# Patient Record
Sex: Male | Born: 1965 | Race: White | Hispanic: No | Marital: Married | Smoking: Former smoker
Health system: Southern US, Community
[De-identification: ages and names within clinical notes are randomized; demographics above are authoritative.]

## PROBLEM LIST (undated history)

## (undated) DIAGNOSIS — F32A Depression, unspecified: Secondary | ICD-10-CM

## (undated) DIAGNOSIS — K649 Unspecified hemorrhoids: Secondary | ICD-10-CM

## (undated) DIAGNOSIS — N529 Male erectile dysfunction, unspecified: Secondary | ICD-10-CM

## (undated) DIAGNOSIS — R194 Change in bowel habit: Secondary | ICD-10-CM

## (undated) DIAGNOSIS — R590 Localized enlarged lymph nodes: Secondary | ICD-10-CM

## (undated) DIAGNOSIS — K760 Fatty (change of) liver, not elsewhere classified: Secondary | ICD-10-CM

## (undated) DIAGNOSIS — C19 Malignant neoplasm of rectosigmoid junction: Secondary | ICD-10-CM

## (undated) DIAGNOSIS — F329 Major depressive disorder, single episode, unspecified: Secondary | ICD-10-CM

## (undated) DIAGNOSIS — Z923 Personal history of irradiation: Secondary | ICD-10-CM

## (undated) DIAGNOSIS — N433 Hydrocele, unspecified: Secondary | ICD-10-CM

## (undated) DIAGNOSIS — Z9221 Personal history of antineoplastic chemotherapy: Secondary | ICD-10-CM

## (undated) DIAGNOSIS — N500 Atrophy of testis: Secondary | ICD-10-CM

## (undated) DIAGNOSIS — E785 Hyperlipidemia, unspecified: Secondary | ICD-10-CM

## (undated) DIAGNOSIS — R918 Other nonspecific abnormal finding of lung field: Secondary | ICD-10-CM

## (undated) HISTORY — DX: Hyperlipidemia, unspecified: E78.5

## (undated) HISTORY — DX: Change in bowel habit: R19.4

## (undated) HISTORY — PX: CHOLECYSTECTOMY: SHX55

## (undated) HISTORY — DX: Major depressive disorder, single episode, unspecified: F32.9

## (undated) HISTORY — DX: Atrophy of testis: N50.0

## (undated) HISTORY — DX: Localized enlarged lymph nodes: R59.0

## (undated) HISTORY — PX: ILEOSTOMY: SHX1783

## (undated) HISTORY — DX: Unspecified hemorrhoids: K64.9

## (undated) HISTORY — DX: Personal history of irradiation: Z92.3

## (undated) HISTORY — DX: Fatty (change of) liver, not elsewhere classified: K76.0

## (undated) HISTORY — DX: Male erectile dysfunction, unspecified: N52.9

## (undated) HISTORY — DX: Other nonspecific abnormal finding of lung field: R91.8

## (undated) HISTORY — DX: Depression, unspecified: F32.A

## (undated) HISTORY — PX: EYE SURGERY: SHX253

## (undated) HISTORY — PX: PORTACATH PLACEMENT: SHX2246

## (undated) HISTORY — DX: Personal history of antineoplastic chemotherapy: Z92.21

## (undated) HISTORY — PX: OTHER SURGICAL HISTORY: SHX169

## (undated) HISTORY — PX: INGUINAL HERNIA REPAIR: SUR1180

---

## 2002-12-10 ENCOUNTER — Encounter: Admission: RE | Admit: 2002-12-10 | Discharge: 2002-12-10 | Payer: Self-pay | Admitting: Occupational Medicine

## 2002-12-10 ENCOUNTER — Encounter: Payer: Self-pay | Admitting: Occupational Medicine

## 2007-01-23 ENCOUNTER — Inpatient Hospital Stay (HOSPITAL_COMMUNITY): Admission: EM | Admit: 2007-01-23 | Discharge: 2007-01-27 | Payer: Self-pay | Admitting: Emergency Medicine

## 2007-01-26 ENCOUNTER — Encounter: Payer: Self-pay | Admitting: Cardiology

## 2009-12-11 ENCOUNTER — Inpatient Hospital Stay (HOSPITAL_COMMUNITY): Admission: EM | Admit: 2009-12-11 | Discharge: 2009-12-14 | Payer: Self-pay | Admitting: Emergency Medicine

## 2009-12-11 ENCOUNTER — Emergency Department (HOSPITAL_COMMUNITY): Admission: EM | Admit: 2009-12-11 | Discharge: 2009-12-11 | Payer: Self-pay | Admitting: Emergency Medicine

## 2009-12-11 ENCOUNTER — Ambulatory Visit: Payer: Self-pay | Admitting: Orthopedic Surgery

## 2009-12-12 ENCOUNTER — Encounter: Payer: Self-pay | Admitting: Orthopedic Surgery

## 2009-12-13 ENCOUNTER — Ambulatory Visit: Payer: Self-pay | Admitting: Psychiatry

## 2009-12-14 ENCOUNTER — Inpatient Hospital Stay (HOSPITAL_COMMUNITY): Admission: AD | Admit: 2009-12-14 | Discharge: 2009-12-15 | Payer: Self-pay | Admitting: Psychiatry

## 2009-12-20 ENCOUNTER — Encounter (INDEPENDENT_AMBULATORY_CARE_PROVIDER_SITE_OTHER): Payer: Self-pay | Admitting: *Deleted

## 2009-12-20 ENCOUNTER — Ambulatory Visit: Payer: Self-pay | Admitting: Orthopedic Surgery

## 2009-12-20 DIAGNOSIS — S62339A Displaced fracture of neck of unspecified metacarpal bone, initial encounter for closed fracture: Secondary | ICD-10-CM | POA: Insufficient documentation

## 2010-01-02 ENCOUNTER — Ambulatory Visit (HOSPITAL_COMMUNITY): Payer: Self-pay | Admitting: Psychology

## 2010-01-10 ENCOUNTER — Ambulatory Visit: Payer: Self-pay | Admitting: Orthopedic Surgery

## 2010-01-10 ENCOUNTER — Telehealth: Payer: Self-pay | Admitting: Orthopedic Surgery

## 2010-01-11 ENCOUNTER — Telehealth: Payer: Self-pay | Admitting: Orthopedic Surgery

## 2010-01-11 ENCOUNTER — Encounter: Payer: Self-pay | Admitting: Orthopedic Surgery

## 2010-01-12 ENCOUNTER — Encounter: Payer: Self-pay | Admitting: Orthopedic Surgery

## 2010-01-17 ENCOUNTER — Ambulatory Visit (HOSPITAL_COMMUNITY): Payer: Self-pay | Admitting: Psychology

## 2010-02-06 ENCOUNTER — Ambulatory Visit (HOSPITAL_COMMUNITY): Payer: Self-pay | Admitting: Psychology

## 2010-08-03 ENCOUNTER — Ambulatory Visit: Payer: Self-pay | Admitting: Hematology and Oncology

## 2010-08-03 ENCOUNTER — Encounter
Admission: RE | Admit: 2010-08-03 | Discharge: 2010-08-03 | Payer: Self-pay | Source: Home / Self Care | Attending: Gastroenterology | Admitting: Gastroenterology

## 2010-08-09 NOTE — Progress Notes (Signed)
Summary: Notes faxed to [facility]. Occ Med for work physical  Phone Note From Other Clinic   Summary of Call: Judeth Cornfield from [facility]  (Ph [phone]) called back re: Work release form and work note. Requested office notes.  We have signed auth on file.  Faxed as requested to # [phone]. Initial call taken by: Cammie Sickle,  January 11, 2010 4:24 PM

## 2010-08-09 NOTE — Letter (Signed)
Summary: Physician work release form [employer]  Physician work release form [employer]   Imported By: Cammie Sickle 01/10/2010 14:54:39  _____________________________________________________________________  External Attachment:    Type:   Image     Comment:   External Document

## 2010-08-09 NOTE — Assessment & Plan Note (Signed)
Summary: 3 WK RE-CK/?NEW XRAY/RT HAND FX CARE/BCBS/CAF   Visit Type:  Follow-up Referring Provider:  ap er Primary Provider:  Dr. Rosezetta Schlatter  CC:  fx care rt hand.  History of Present Illness: I saw [name] in the office today for an initial visit.  He is a 46 years old man with the complaint of:  right hand 5th metacarpal fracture.  DOI 12/11/09 hit something.    Meds: Percocet 5 for pain.  hospital followup palm consultation fractured RIGHT 5th metacarpal  x-rays show that there is minimal fracture line noted, old fracture, angulation noted.  Recommend buddy taping for 3 weeks. Active range of motion followup for range of motion check.  Today is 3 week recheck on ROM after buddy tape.  Doing good, no pain med needed.  He has full range of motion his RIGHT hand no deformity and the rotational alignment of the fifth metacarpal he does have a extensor lag of his RIGHT ring finger at the DIP joint with some tenderness but he says it doesn't affect his hand motion  I offered him treatment for this which seems to be a mallet finger and he said that it doesn't bother him so he doesn't need anything done with it and it emphasized to him that we need to treat it now for going to do anything about it and he declined      Allergies: No Known Drug Allergies  Physical Exam  Additional Exam:  mention rrf ext tendon   Impression & Recommendations:  Problem # 1:  CLOSED FRACTURE OF NECK OF METACARPAL BONE (ICD-815.04) Assessment Improved  Orders: Post-Op Check (11914)  Patient Instructions: 1)  follow as needed

## 2010-08-09 NOTE — Letter (Signed)
Summary: Out of Work  [facility]  [address]  [place], [place] [address]   Phone: [phone]  Fax: [phone]    January 10, 2010   Employee:  [name]    To Whom It May Concern:  Patient had an appointment in our office today, 01/10/10.   For Medical reasons, please excuse/ the above named employee from work for the following dates:  Start:   12/11/2009  End/Return to regular work schedule, full duty:  01/15/10   If you need additional information, please feel free to contact our office.         Sincerely,    Terrance Mass, MD

## 2010-08-09 NOTE — Consult Note (Signed)
Summary: Hospital Consult FX 5th metacarpal  Hospital Consult FX 5th metacarpal   Imported By: Cammie Sickle 12/19/2009 20:05:57  _____________________________________________________________________  External Attachment:    Type:   Image     Comment:   External Document

## 2010-08-09 NOTE — Assessment & Plan Note (Signed)
Summary: HOSP FOL/UP/12/12/09/FX RT HAND/TREATED 12/12/09/XR THERE/BCBS/BSF   Vital Signs:  Patient profile:   45 year old male Height:      72 inches Weight:      200 pounds Pulse rate:   82 / minute Resp:     16 per minute  Vitals Entered By: Fuller Canada MD (December 20, 2009 2:44 PM)  Visit Type:  new patient Referring Provider:  ap er Primary Provider:  Dr. Rosezetta Schlatter  CC:  right hand fracture.  History of Present Illness: I saw [name] in the office today for an initial visit.  He is a 45 years old man with the complaint of:  right hand 5th metacarpal fracture.  DOI 12/11/09 hit something.  9 days post injury   Meds: Norco 5 for pain.  hospital followup palm consultation fractured RIGHT 5th metacarpal.  The patient was placed in a resting volar splint secondary to major soft tissue swelling. The splint seems to be cutting into his hand. He is here for followup and x-rays will be done today.   [facility]    x-rays show that there is minimal fracture line noted, old fracture, angulation noted.  Recommend buddy taping for 3 weeks. Active range of motion followup for range of motion check.  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 Cardiovascular:  Denies chest pain, palpitations, fainting, and murmurs. Respiratory:  Complains of snoring; denies short of breath, wheezing, couch, tightness, pain on inspiration, and snoring . Gastrointestinal:  Denies heartburn, nausea, vomiting, diarrhea, constipation, and blood in your  stools. Genitourinary:  Denies frequency, urgency, difficulty urinating, painful urination, flank pain, and bleeding in urine. Neurologic:  Denies numbness, tingling, unsteady gait, dizziness, tremors, and seizure. Musculoskeletal:  Complains of joint pain; denies swelling, instability, stiffness, redness, heat, and muscle pain. Endocrine:  Denies excessive thirst, exessive urination, and heat or cold intolerance. Psychiatric:  Complains of anxiety; denies nervousness, depression, and hallucinations. Skin:  Denies changes in the skin, poor healing, rash, itching, and redness. HEENT:  Denies blurred or double vision, eye pain, redness, and watering. Immunology:  Denies seasonal allergies, sinus problems, and allergic to bee stings. Hemoatologic:  Denies easy bleeding and brusing.  Physical Exam  Additional Exam:  he does have some soft tissue swelling still around the hand, but it is much less than before is tender over the 4th and 5th metacarpal heads.  I did not detect any rotatory deformity and passive flexion of 80 at the metacarpophalangeal joint.     Impression & Recommendations: 2 views of the RIGHT 5th metacarpal  Medications Added to Medication List This Visit: 1)  Percocet 5-325 Mg Tabs (Oxycodone-acetaminophen) .Marland Kitchen.. 1 by mouth q 4 as needed pain  Other Orders: Post-Op Check ([address]) Hand x-ray, minimum 3 views (73130)  Patient Instructions: 1)  buddy tape x 3 weeks  2)  norco for pain  3)  f/u in 3 weeks  Prescriptions: PERCOCET 5-325 MG TABS (OXYCODONE-ACETAMINOPHEN) 1 by mouth q 4 as needed pain  #60 x 0   Entered and Authorized by:   Fuller Canada MD   Signed  by:   Fuller Canada MD on 12/20/2009   Method used:   Handwritten   RxID:   [id number]

## 2010-08-09 NOTE — Medication Information (Signed)
Summary: Tax adviser   Imported By: Cammie Sickle 12/25/2009 18:14:32  _____________________________________________________________________  External Attachment:    Type:   Image     Comment:   External Document

## 2010-08-09 NOTE — Letter (Signed)
Summary: External Other  External Other   Imported By: Cammie Sickle 01/23/2010 13:42:48  _____________________________________________________________________  External Attachment:    Type:   Image     Comment:   External Document

## 2010-08-09 NOTE — Letter (Signed)
Summary: Out of Work  [facility]  [address]  [place], [place] [address]   Phone: [phone]  Fax: [phone]    December 20, 2009   Employee:  [name]    To Whom It May Concern:   For Medical reasons, please excuse the above named employee from work for the following dates:  Start:   12/11/2009  End:   01/10/10  Next scheduled appointment date:  01/10/10   If you need additional information, please feel free to contact our office.         Sincerely,    Terrance Mass, MD

## 2010-08-09 NOTE — Letter (Signed)
Summary: [employer] disab form  [employer] disab form   Imported By: Cammie Sickle 01/23/2010 11:37:24  _____________________________________________________________________  External Attachment:    Type:   Image     Comment:   External Document

## 2010-08-09 NOTE — Progress Notes (Signed)
Summary: Physician release form faxed   Phone Note Outgoing Call   Call placed to: Specialist Summary of Call: Physician release form faxed as per patient and employer request to:  [employer] [employer] Fax # [phone] with work note.  Patient has appt for physical w/company physician 01/11/10. Initial call taken by: Cammie Sickle,  January 10, 2010 2:53 PM

## 2010-08-09 NOTE — Letter (Signed)
Summary: History form  History form   Imported By: Jacklynn Ganong 12/25/2009 13:12:50  _____________________________________________________________________  External Attachment:    Type:   Image     Comment:   External Document

## 2010-08-10 ENCOUNTER — Ambulatory Visit (HOSPITAL_COMMUNITY)
Admission: RE | Admit: 2010-08-10 | Discharge: 2010-08-10 | Disposition: A | Discharge status: Home / Self Care | Payer: BC Managed Care – PPO | Source: Ambulatory Visit | Attending: Gastroenterology | Admitting: Gastroenterology

## 2010-08-10 DIAGNOSIS — C20 Malignant neoplasm of rectum: Secondary | ICD-10-CM | POA: Insufficient documentation

## 2010-08-14 ENCOUNTER — Other Ambulatory Visit: Payer: Self-pay | Admitting: Hematology and Oncology

## 2010-08-14 ENCOUNTER — Encounter (HOSPITAL_BASED_OUTPATIENT_CLINIC_OR_DEPARTMENT_OTHER): Payer: BC Managed Care – PPO | Admitting: Hematology and Oncology

## 2010-08-14 DIAGNOSIS — C20 Malignant neoplasm of rectum: Secondary | ICD-10-CM

## 2010-08-14 DIAGNOSIS — Z85048 Personal history of other malignant neoplasm of rectum, rectosigmoid junction, and anus: Secondary | ICD-10-CM

## 2010-08-14 LAB — COMPREHENSIVE METABOLIC PANEL
ALT: 25 U/L (ref 0–53)
Alkaline Phosphatase: 52 U/L (ref 39–117)
Creatinine, Ser: 0.95 mg/dL (ref 0.40–1.50)
Sodium: 140 mEq/L (ref 135–145)
Total Bilirubin: 0.4 mg/dL (ref 0.3–1.2)
Total Protein: 6.6 g/dL (ref 6.0–8.3)

## 2010-08-14 LAB — CBC WITH DIFFERENTIAL/PLATELET
BASO%: 0.6 % (ref 0.0–2.0)
LYMPH%: 23.2 % (ref 14.0–49.0)
MCH: 28.2 pg (ref 27.2–33.4)
MCHC: 34.2 g/dL (ref 32.0–36.0)
MCV: 82.4 fL (ref 79.3–98.0)
MONO%: 9.4 % (ref 0.0–14.0)
Platelets: 212 10*3/uL (ref 140–400)
RBC: 5.42 10*6/uL (ref 4.20–5.82)
WBC: 6 10*3/uL (ref 4.0–10.3)

## 2010-08-16 HISTORY — PX: PROCTECTOMY: SHX315

## 2010-08-20 ENCOUNTER — Ambulatory Visit: Payer: BC Managed Care – PPO | Attending: Radiation Oncology | Admitting: Radiation Oncology

## 2010-08-20 DIAGNOSIS — Z79899 Other long term (current) drug therapy: Secondary | ICD-10-CM | POA: Insufficient documentation

## 2010-08-20 DIAGNOSIS — Z87891 Personal history of nicotine dependence: Secondary | ICD-10-CM | POA: Insufficient documentation

## 2010-08-20 DIAGNOSIS — Z809 Family history of malignant neoplasm, unspecified: Secondary | ICD-10-CM | POA: Insufficient documentation

## 2010-08-20 DIAGNOSIS — Z803 Family history of malignant neoplasm of breast: Secondary | ICD-10-CM | POA: Insufficient documentation

## 2010-08-20 DIAGNOSIS — C20 Malignant neoplasm of rectum: Secondary | ICD-10-CM | POA: Insufficient documentation

## 2010-08-20 DIAGNOSIS — Z808 Family history of malignant neoplasm of other organs or systems: Secondary | ICD-10-CM | POA: Insufficient documentation

## 2010-08-20 DIAGNOSIS — E785 Hyperlipidemia, unspecified: Secondary | ICD-10-CM | POA: Insufficient documentation

## 2010-08-20 DIAGNOSIS — M109 Gout, unspecified: Secondary | ICD-10-CM | POA: Insufficient documentation

## 2010-08-21 ENCOUNTER — Ambulatory Visit (HOSPITAL_COMMUNITY)
Admission: RE | Admit: 2010-08-21 | Discharge: 2010-08-21 | Disposition: A | Discharge status: Home / Self Care | Payer: BC Managed Care – PPO | Source: Ambulatory Visit | Attending: Hematology and Oncology | Admitting: Hematology and Oncology

## 2010-08-21 ENCOUNTER — Encounter (HOSPITAL_COMMUNITY): Payer: Self-pay

## 2010-08-21 DIAGNOSIS — C20 Malignant neoplasm of rectum: Secondary | ICD-10-CM | POA: Insufficient documentation

## 2010-08-23 ENCOUNTER — Encounter (HOSPITAL_COMMUNITY): Payer: Self-pay

## 2010-08-23 ENCOUNTER — Encounter (HOSPITAL_COMMUNITY)
Admission: RE | Admit: 2010-08-23 | Discharge: 2010-08-23 | Disposition: A | Discharge status: Home / Self Care | Payer: BC Managed Care – PPO | Source: Ambulatory Visit | Attending: Hematology and Oncology | Admitting: Hematology and Oncology

## 2010-08-23 DIAGNOSIS — C20 Malignant neoplasm of rectum: Secondary | ICD-10-CM | POA: Insufficient documentation

## 2010-08-23 DIAGNOSIS — K7689 Other specified diseases of liver: Secondary | ICD-10-CM | POA: Insufficient documentation

## 2010-08-23 DIAGNOSIS — Z85048 Personal history of other malignant neoplasm of rectum, rectosigmoid junction, and anus: Secondary | ICD-10-CM

## 2010-08-23 LAB — GLUCOSE, CAPILLARY: Glucose-Capillary: 110 mg/dL — ABNORMAL HIGH (ref 70–99)

## 2010-08-23 MED ORDER — FLUDEOXYGLUCOSE F - 18 (FDG) INJECTION
16.4000 | Freq: Once | INTRAVENOUS | Status: AC | PRN
  Administered 2010-08-23: 16.4 via INTRAVENOUS

## 2010-09-24 LAB — CBC
HCT: 43.7 % (ref 39.0–52.0)
Hemoglobin: 14.8 g/dL (ref 13.0–17.0)
Hemoglobin: 14.9 g/dL (ref 13.0–17.0)
MCHC: 34.2 g/dL (ref 30.0–36.0)
MCHC: 34.7 g/dL (ref 30.0–36.0)
MCV: 84.2 fL (ref 78.0–100.0)
Platelets: 201 10*3/uL (ref 150–400)
RDW: 12.8 % (ref 11.5–15.5)
RDW: 13.2 % (ref 11.5–15.5)

## 2010-09-24 LAB — RAPID URINE DRUG SCREEN, HOSP PERFORMED
Benzodiazepines: POSITIVE — AB
Cocaine: NOT DETECTED
Opiates: NOT DETECTED
Tetrahydrocannabinol: NOT DETECTED

## 2010-09-24 LAB — DIFFERENTIAL
Basophils Absolute: 0 10*3/uL (ref 0.0–0.1)
Basophils Relative: 0 % (ref 0–1)
Basophils Relative: 0 % (ref 0–1)
Eosinophils Absolute: 0 10*3/uL (ref 0.0–0.7)
Eosinophils Relative: 0 % (ref 0–5)
Eosinophils Relative: 2 % (ref 0–5)
Lymphocytes Relative: 25 % (ref 12–46)
Lymphs Abs: 1.2 10*3/uL (ref 0.7–4.0)
Monocytes Absolute: 0.4 10*3/uL (ref 0.1–1.0)
Monocytes Absolute: 0.6 10*3/uL (ref 0.1–1.0)
Monocytes Relative: 7 % (ref 3–12)
Neutrophils Relative %: 73 % (ref 43–77)

## 2010-09-24 LAB — URINALYSIS, ROUTINE W REFLEX MICROSCOPIC
Bilirubin Urine: NEGATIVE
Hgb urine dipstick: NEGATIVE
Ketones, ur: NEGATIVE mg/dL
Nitrite: NEGATIVE
pH: 5.5 (ref 5.0–8.0)

## 2010-09-24 LAB — URINE CULTURE: Colony Count: NO GROWTH

## 2010-09-24 LAB — BLOOD GAS, ARTERIAL
Bicarbonate: 23.8 mEq/L (ref 20.0–24.0)
FIO2: 1 %
O2 Saturation: 99.6 %
Patient temperature: 37
TCO2: 20.9 mmol/L (ref 0–100)
pO2, Arterial: 360 mmHg — ABNORMAL HIGH (ref 80.0–100.0)

## 2010-09-24 LAB — COMPREHENSIVE METABOLIC PANEL
ALT: 28 U/L (ref 0–53)
AST: 21 U/L (ref 0–37)
Albumin: 3.8 g/dL (ref 3.5–5.2)
Albumin: 4.1 g/dL (ref 3.5–5.2)
Alkaline Phosphatase: 51 U/L (ref 39–117)
BUN: 8 mg/dL (ref 6–23)
Calcium: 8.6 mg/dL (ref 8.4–10.5)
Chloride: 113 mEq/L — ABNORMAL HIGH (ref 96–112)
Creatinine, Ser: 1 mg/dL (ref 0.4–1.5)
GFR calc Af Amer: >60 mL/min (ref >60)
Glucose, Bld: 103 mg/dL — ABNORMAL HIGH (ref 70–99)
Potassium: 3.7 mEq/L (ref 3.5–5.1)
Sodium: 143 mEq/L (ref 135–145)
Total Bilirubin: 0.8 mg/dL (ref 0.3–1.2)
Total Protein: 6.6 g/dL (ref 6.0–8.3)

## 2010-09-24 LAB — ACETAMINOPHEN LEVEL: Acetaminophen (Tylenol), Serum: <10 ug/mL — ABNORMAL LOW (ref 10–30)

## 2010-09-24 LAB — ETHANOL: Alcohol, Ethyl (B): 128 mg/dL — ABNORMAL HIGH (ref 0–10)

## 2010-09-24 LAB — TRICYCLICS SCREEN, URINE: TCA Scrn: NOT DETECTED

## 2010-10-17 ENCOUNTER — Ambulatory Visit: Payer: BC Managed Care – PPO | Attending: Radiation Oncology | Admitting: Radiation Oncology

## 2010-10-17 DIAGNOSIS — Z79899 Other long term (current) drug therapy: Secondary | ICD-10-CM | POA: Insufficient documentation

## 2010-10-17 DIAGNOSIS — C20 Malignant neoplasm of rectum: Secondary | ICD-10-CM | POA: Insufficient documentation

## 2010-10-17 DIAGNOSIS — C779 Secondary and unspecified malignant neoplasm of lymph node, unspecified: Secondary | ICD-10-CM | POA: Insufficient documentation

## 2010-10-17 DIAGNOSIS — Z980 Intestinal bypass and anastomosis status: Secondary | ICD-10-CM | POA: Insufficient documentation

## 2010-10-17 DIAGNOSIS — Z87891 Personal history of nicotine dependence: Secondary | ICD-10-CM | POA: Insufficient documentation

## 2010-10-17 DIAGNOSIS — Z932 Ileostomy status: Secondary | ICD-10-CM | POA: Insufficient documentation

## 2010-10-18 ENCOUNTER — Encounter (HOSPITAL_BASED_OUTPATIENT_CLINIC_OR_DEPARTMENT_OTHER): Payer: BC Managed Care – PPO | Admitting: Hematology and Oncology

## 2010-10-18 ENCOUNTER — Other Ambulatory Visit: Payer: Self-pay | Admitting: Hematology and Oncology

## 2010-10-18 DIAGNOSIS — C20 Malignant neoplasm of rectum: Secondary | ICD-10-CM

## 2010-10-18 DIAGNOSIS — Z5111 Encounter for antineoplastic chemotherapy: Secondary | ICD-10-CM

## 2010-10-18 LAB — CBC WITH DIFFERENTIAL/PLATELET
Basophils Absolute: 0 10*3/uL (ref 0.0–0.1)
Eosinophils Absolute: 0.1 10*3/uL (ref 0.0–0.5)
HGB: 14.1 g/dL (ref 13.0–17.1)
MCV: 81.7 fL (ref 79.3–98.0)
MONO#: 0.6 10*3/uL (ref 0.1–0.9)
MONO%: 12.3 % (ref 0.0–14.0)
NEUT#: 2.6 10*3/uL (ref 1.5–6.5)
RDW: 13.3 % (ref 11.0–14.6)
lymph#: 1.3 10*3/uL (ref 0.9–3.3)

## 2010-10-18 LAB — COMPREHENSIVE METABOLIC PANEL
Albumin: 4.3 g/dL (ref 3.5–5.2)
BUN: 10 mg/dL (ref 6–23)
CO2: 24 mEq/L (ref 19–32)
Calcium: 9.6 mg/dL (ref 8.4–10.5)
Chloride: 107 mEq/L (ref 96–112)
Glucose, Bld: 91 mg/dL (ref 70–99)
Potassium: 4.2 mEq/L (ref 3.5–5.3)

## 2010-10-31 ENCOUNTER — Other Ambulatory Visit: Payer: Self-pay | Admitting: Hematology and Oncology

## 2010-10-31 ENCOUNTER — Encounter (HOSPITAL_BASED_OUTPATIENT_CLINIC_OR_DEPARTMENT_OTHER): Payer: BC Managed Care – PPO | Admitting: Hematology and Oncology

## 2010-10-31 DIAGNOSIS — C20 Malignant neoplasm of rectum: Secondary | ICD-10-CM

## 2010-10-31 DIAGNOSIS — Z5111 Encounter for antineoplastic chemotherapy: Secondary | ICD-10-CM

## 2010-10-31 LAB — CBC WITH DIFFERENTIAL/PLATELET
Basophils Absolute: 0 10*3/uL (ref 0.0–0.1)
EOS%: 3.4 % (ref 0.0–7.0)
Eosinophils Absolute: 0.2 10*3/uL (ref 0.0–0.5)
HGB: 15.2 g/dL (ref 13.0–17.1)
NEUT#: 2.8 10*3/uL (ref 1.5–6.5)
RDW: 13.5 % (ref 11.0–14.6)
WBC: 4.8 10*3/uL (ref 4.0–10.3)
lymph#: 1.4 10*3/uL (ref 0.9–3.3)

## 2010-11-02 ENCOUNTER — Encounter (HOSPITAL_BASED_OUTPATIENT_CLINIC_OR_DEPARTMENT_OTHER): Payer: BC Managed Care – PPO | Admitting: Hematology and Oncology

## 2010-11-02 DIAGNOSIS — C20 Malignant neoplasm of rectum: Secondary | ICD-10-CM

## 2010-11-02 DIAGNOSIS — Z5189 Encounter for other specified aftercare: Secondary | ICD-10-CM

## 2010-11-10 ENCOUNTER — Emergency Department (HOSPITAL_COMMUNITY): Payer: BC Managed Care – PPO

## 2010-11-10 ENCOUNTER — Ambulatory Visit (HOSPITAL_COMMUNITY)
Admission: EM | Admit: 2010-11-10 | Discharge: 2010-11-12 | Disposition: A | Discharge status: Home / Self Care | Payer: BC Managed Care – PPO | Attending: Surgery | Admitting: Surgery

## 2010-11-10 DIAGNOSIS — Z01812 Encounter for preprocedural laboratory examination: Secondary | ICD-10-CM | POA: Insufficient documentation

## 2010-11-10 DIAGNOSIS — Z01810 Encounter for preprocedural cardiovascular examination: Secondary | ICD-10-CM | POA: Insufficient documentation

## 2010-11-10 DIAGNOSIS — R1011 Right upper quadrant pain: Secondary | ICD-10-CM | POA: Insufficient documentation

## 2010-11-10 DIAGNOSIS — C20 Malignant neoplasm of rectum: Secondary | ICD-10-CM | POA: Insufficient documentation

## 2010-11-10 DIAGNOSIS — C189 Malignant neoplasm of colon, unspecified: Secondary | ICD-10-CM

## 2010-11-10 DIAGNOSIS — Z932 Ileostomy status: Secondary | ICD-10-CM | POA: Insufficient documentation

## 2010-11-10 DIAGNOSIS — Z79899 Other long term (current) drug therapy: Secondary | ICD-10-CM | POA: Insufficient documentation

## 2010-11-10 DIAGNOSIS — K8010 Calculus of gallbladder with chronic cholecystitis without obstruction: Secondary | ICD-10-CM | POA: Insufficient documentation

## 2010-11-10 DIAGNOSIS — K8000 Calculus of gallbladder with acute cholecystitis without obstruction: Secondary | ICD-10-CM | POA: Insufficient documentation

## 2010-11-10 DIAGNOSIS — Z980 Intestinal bypass and anastomosis status: Secondary | ICD-10-CM | POA: Insufficient documentation

## 2010-11-10 LAB — DIFFERENTIAL
Basophils Absolute: 0 10*3/uL (ref 0.0–0.1)
Eosinophils Absolute: 0.1 10*3/uL (ref 0.0–0.7)
Lymphocytes Relative: 10 % — ABNORMAL LOW (ref 12–46)
Monocytes Absolute: 1.1 10*3/uL — ABNORMAL HIGH (ref 0.1–1.0)
Neutro Abs: 11.4 10*3/uL — ABNORMAL HIGH (ref 1.7–7.7)

## 2010-11-10 LAB — URINALYSIS, ROUTINE W REFLEX MICROSCOPIC
Glucose, UA: NEGATIVE mg/dL
Hgb urine dipstick: NEGATIVE
Leukocytes, UA: NEGATIVE
Specific Gravity, Urine: 1.028 (ref 1.005–1.030)
Urobilinogen, UA: 0.2 mg/dL (ref 0.0–1.0)

## 2010-11-10 LAB — COMPREHENSIVE METABOLIC PANEL
ALT: 25 U/L (ref 0–53)
AST: 17 U/L (ref 0–37)
Albumin: 3.9 g/dL (ref 3.5–5.2)
CO2: 24 mEq/L (ref 19–32)
Calcium: 9.5 mg/dL (ref 8.4–10.5)
Creatinine, Ser: 0.84 mg/dL (ref 0.4–1.5)
GFR calc Af Amer: >60 mL/min (ref >60)
Sodium: 138 mEq/L (ref 135–145)
Total Protein: 6.8 g/dL (ref 6.0–8.3)

## 2010-11-10 LAB — CBC
Hemoglobin: 13.8 g/dL (ref 13.0–17.0)
MCH: 27.6 pg (ref 26.0–34.0)
MCHC: 34.1 g/dL (ref 30.0–36.0)
Platelets: 173 10*3/uL (ref 150–400)
RDW: 13.6 % (ref 11.5–15.5)

## 2010-11-10 LAB — URINE MICROSCOPIC-ADD ON

## 2010-11-10 LAB — APTT: aPTT: 30 seconds (ref 24–37)

## 2010-11-10 LAB — PROTIME-INR: INR: 1.11 (ref 0.00–1.49)

## 2010-11-11 ENCOUNTER — Inpatient Hospital Stay (HOSPITAL_COMMUNITY): Payer: BC Managed Care – PPO

## 2010-11-11 ENCOUNTER — Other Ambulatory Visit: Payer: Self-pay | Admitting: Surgery

## 2010-11-11 LAB — MRSA PCR SCREENING: MRSA by PCR: NEGATIVE

## 2010-11-20 NOTE — H&P (Signed)
NAMEDAMEAN, POFFENBERGER NO.:  [id number]  MEDICAL RECORD NO.:  [id number]           PATIENT TYPE:  E  LOCATION:  WLED                         FACILITY:  [facility]  PHYSICIAN:  Velora Heckler, MD      DATE OF BIRTH:  April 19, 1966  DATE OF ADMISSION:  11/10/2010                              HISTORY & PHYSICAL   REFERRING PHYSICIAN:  Emeline General, M.D., [facility].  REASON FOR ADMISSION:  Abdominal pain, acute cholecystitis, cholelithiasis.  BRIEF HISTORY:  The patient is a 45 year old white male who presents to the emergency department with 3-day history of intermittent right upper quadrant abdominal pain.  Severity has increased.  He has developed nausea and vomiting.  Pain radiates to the back.  The patient was seen in the emergency department.  His wife is a Engineer, civil (consulting) at [employer].  Laboratory studies showed elevated white count of 14,000, although the patient has been receiving Neupogen as part of his adjuvant treatment for rectal carcinoma.  The patient underwent abdominal ultrasound which showed a thickened irregular gallbladder wall.  There were gallstones and sludge present.  There was tenderness in the right upper quadrant.  There was no biliary dilatation.  General Surgery was asked to evaluate for acute cholecystitis and its management.  PAST MEDICAL HISTORY:  The patient diagnosed with rectal carcinoma. Initially evaluated by Dr. Avel Peace.  He was referred to [facility] and underwent low anterior resection with ileostomy on September 05, 2010.  The patient is receiving adjuvant chemotherapy under the direction of Dr. Dalene Carrow.  Adjuvant radiation therapy is planned. The patient is status post Port-A-Cath placement, right upper chest wall.  He had a right inguinal hernia repaired in 2006 at [facility].  MEDICATIONS:  Zofran, oxycodone, Compazine, Flagyl, chemotherapy of unknown type.  ALLERGIES:  PERCOCET causing  itching.  REVIEW OF SYSTEMS:  15-system review without significant other findings except as noted above.  FAMILY HISTORY:  Noncontributory.  PHYSICAL EXAMINATION:  GENERAL:  A 45 year old well-developed, well- nourished white male ambulating in the emergency department in mild discomfort. VITAL SIGNS:  Temperature 98.8, heart rate 71, blood pressure 129/77, respiratory rate 19, O2 saturation 98% on room air. HEENT:  Shows him to be normocephalic, atraumatic.  Sclerae clear. Conjunctiva clear.  Pupils equal and reactive.  Dentition fair.  Mucous membranes moist.  Voice normal. NECK:  Palpation of the neck shows no lymphadenopathy.  No thyroid nodularity.  No tenderness. LUNGS:  Clear to auscultation bilaterally. CARDIAC:  Shows regular rate and rhythm without significant murmur. Chest wall shows a Port-A-Cath in the right upper chest wall which has been accessed. ABDOMEN:  Soft without distention.  Well-healed Pfannenstiel-type incision.  Ileostomy in the right mid abdomen.  No sign of hernia.  No sign of wound infections.  There are bowel sounds on auscultation. There is mild to moderate tenderness in the right upper quadrant with some voluntary guarding.  There is no palpable mass.  There is no hepatosplenomegaly. EXTREMITIES:  Nontender without edema. NEUROLOGICAL:  The patient is alert and oriented without focal deficit. There is no sign  of tremor.  LABORATORY STUDIES:  White count 14.0, hemoglobin 13.8, platelet count 173,000.  Electrolytes are normal.  Liver function tests are normal. Lipase normal at 46.  RADIOGRAPHIC STUDIES:  Ultrasound demonstrating a thickened gallbladder wall containing stones and sludge with tenderness.  IMPRESSION: 1. Acute cholecystitis, cholelithiasis. 2. History of rectal carcinoma with ongoing adjuvant chemotherapy.  PLAN: 1. The patient will be admitted to the General Surgery service at     [facility]. 2. Intravenous  antibiotics will be initiated.  The patient will be     allowed a clear liquid diet today. 3. Medical Oncology consultation will be requested. 4. Tentatively planned for surgery on the morning of Nov 11, 2010 at     [facility] for laparoscopic cholecystectomy     with intraoperative cholangiography.   Velora Heckler, MD, FACS     TMG/MEDQ  D:  11/10/2010  T:  11/10/2010  Job:  [id number]  cc:   Laurice Record, M.D. Fax: [phone]  Electronically Signed by Darnell Level MD on 11/20/2010 12:04:45 PM

## 2010-11-20 NOTE — Discharge Summary (Signed)
NAMEJARON, [name]                ACCOUNT NO.:  [id number]   MEDICAL RECORD NO.:  [id number]          PATIENT TYPE:  INP   LOCATION:  1417                         FACILITY:  [facility]   PHYSICIAN:  Mohan N. Sharyn Lull, M.D. DATE OF BIRTH:  10/30/1965   DATE OF ADMISSION:  01/23/2007  DATE OF DISCHARGE:  01/27/2007                               DISCHARGE SUMMARY   ADMITTING DIAGNOSES:  1. Chest pain, rule out myocardial infarction.  2. Elevated blood pressure, rule out hypertension.  3. Hypercholesteremia.  4. History of tobacco abuse.  5. History of cocaine abuse.   DISCHARGE DIAGNOSES:  1. Status post chest pain, MI ruled out, negative Persantine Myoview,      rule out GERD, rule out peptic ulcer disease, rule out gastritis,      resolving.  2. Resolving an acute gouty arthritis of right elbow/cellulitis of the      right elbow, arm, and forearm.  3. Hypercholesteremia.  4. Remote history of tobacco and cocaine abuse.   DISCHARGE HOME MEDICATIONS:  1. Enteric-coated aspirin 81 mg 1 tablet daily.  2. Indocin 50 mg 1 tablet three times daily with food.  3. Colchicine 0.6 mg 1 tablet daily.  4. Allopurinol 300 mg tablet daily.  5. Protonix 40 mg 1 tablet daily.  6. Keflex 500 mg 1 tablet every 8 hours for 7 days.  7. Simcor 500/20 1 tablet daily.  8. Nitrostat 0.4 mg sublingual, use as directed.   DIET:  Low salt, low cholesterol.   ACTIVITY:  As tolerated.  Follow-up with me in 1 week.   CONDITION AT DISCHARGE:  Is stable.  The patient has been advised to go  to the ER, if his elbow swelling, redness and pain gets worse.   BRIEF HISTORY AND HOSPITAL COURSE:  [name] is a 45 year old white  male with no significant past medical history, except for  hypercholesteremia and positive family history of coronary artery  disease, history of remote tobacco abuse and cocaine abuse.  He came to  the ER complaining of retrosternal chest pressure, tightness, as if  someone is sitting  on the chest, associated with tingling in the neck,  diaphoresis.  Chest pain was rated 6/10 off and on, also associated with  shortness of breath.  The patient gives history of exertional chest pain  and exertional dyspnea, relieved with rest.  Denies any palpitation,  lightheadedness or syncope, but states feels dizzy and felt dizzy and  nearly passed out today.   PAST MEDICAL HISTORY:  As above.   PAST SURGICAL HISTORY:  Had right inguinal hernia repairs in the past.   ALLERGIES:  NO KNOWN DRUG ALLERGIES.   MEDICATION:  None.   SOCIAL HISTORY:  He is married and has two children.  Smoked one pack  per day for 20 years, quit 9 years ago.  Drinks beer and whiskey  occasionally, socially.  He did cocaine approximately 17 years ago.  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   FAMILY HISTORY:  Father is alive.  He is 64.  He had hypercholesteremia.  Mother is  alive.  She is hypertensive.  She had two strokes.  One  brother and one sister in good health.  His grandparents had MI at young  age.   PHYSICAL EXAMINATION:  GENERAL:  On examination, he is alert, awake,  oriented x3, in no acute distress.  VITAL SIGNS:  Blood pressure was 127/88, pulse was 68, regular.  HEENT:  Conjunctivae was pink.  NECK:  Supple, no JVD, no bruit.  LUNGS:  Clear to auscultation without rhonchi or rales.  CARDIOVASCULAR:  S1, S2 was normal.  There was soft S4 gallop.  There  was no S3 gallop or murmur.  ABDOMEN:  Soft.  Bowel sounds were present, nontender.  EXTREMITIES:  There is no clubbing, cyanosis or edema.   LABS:  EKG showed normal sinus rhythm with no acute ischemic changes.  His other labs:  Cholesterol was 209, triglyceride 271, HDL was low at  29, LDL was 126.  There is three sets of cardiac enzymes:  CK 130, MB  1.1, second set CK 98, MB 1.0; third set CK 87, MB 1.0.  Troponin I:  Three sets were 0.01, 0.03 and 0.02.  His sodium was 143, potassium 4.1,  chloride 111, bicarb 27, glucose  91, BUN 12, creatinine 1.03.  Hemoglobin was 13.7, hematocrit 39.8, white count of 5.9.   BRIEF HOSPITAL COURSE:  The patient was admitted to telemetry unit at  [facility] and was started on IV heparin, nitrates.  MI was  ruled out by serial enzymes, and EKG stress test could not be done  during the weekend, due to busy schedule and subsequently underwent  Persantine Myoview on July 21, which showed no evidence of reversible  ischemia, with EF of 70%.  The patient also complained of severe right  elbow pain and swelling and redness yesterday.  The patient was started  on NSAIDS and also started on IV antibiotics, with improvement in his  elbow swelling and pain.  His uric acid level is mildly elevated.  The  patient was eager to go home.  The patient remained afebrile during the  hospital stay.  The patient will be discharged home on above medications  and will be followed up in my office in 1 week.  The patient has been  advised to report to the ER,  if his right elbow pain, swelling, redness  gets worse, or if he develops any fever or chills.           ______________________________  Eduardo Osier Sharyn Lull, M.D.     MNH/MEDQ  D:  01/27/2007  T:  01/27/2007  Job:  [id number]   cc:   Eduardo Osier. Sharyn Lull, M.D.

## 2010-11-20 NOTE — Discharge Summary (Signed)
[name], [name]                ACCOUNT NO.:  [id number]  MEDICAL RECORD NO.:  [id number]           PATIENT TYPE:  I  LOCATION:  1524                         FACILITY:  [facility]  PHYSICIAN:  Velora Heckler, MD      DATE OF BIRTH:  09/30/1965  DATE OF ADMISSION:  11/10/2010 DATE OF DISCHARGE:  11/12/2010                              DISCHARGE SUMMARY   ADMISSION DIAGNOSES: 1. Acute cholecystitis/cholelithiasis. 2. Rectal cancer with anterior resection ileostomy, September 05, 2010     at [facility]. 3. Ongoing chemotherapy.  DISCHARGE DIAGNOSES: 1. Acute cholecystitis/cholelithiasis. 2. Rectal cancer with anterior resection ileostomy, September 05, 2010     at [facility]. 3. Ongoing chemotherapy.  PROCEDURES:  Laparoscopic cholecystectomy and intraoperative cholangiogram.  BRIEF HISTORY:  The patient is a 45 year old white male who presented with 3-day history of intermittent right upper quadrant pain which became progressively worse.  He had nausea and vomiting, pain went to his back.  He was seen in the emergency room at [facility].  White count was elevated.  He has been receiving Neupogen as part of his adjuvant therapy for rectal cancer.  Ultrasound showed an irregular thickened gallbladder wall for gallstones and sludge present.  He was seen in consultation by Dr. Gerrit Friends and his impression, the patient had acute cholecystitis and he recommended laparoscopic cholecystectomy.  He also requested oncology consult.  For further history and physical, please see the dictated note.  HOSPITAL COURSE:  The patient was admitted and taken to the OR after oncology consult.  It was their opinion that he has stage III rectal cancer and anterior resection with coloanal anastomosis.  They recommend going forward with regular surgical treatment which was done on Nov 11, 2010 by Dr. Gerrit Friends.  The patient tolerated the procedure well and returned to the floor in satisfactory condition.  His diet was  advanced and he was taking a soft diet by evening, Nov 11, 2010.  This morning, he was able to eat regular diet.  He is passing gas.  He has stool in his ileostomy.   He has been seen by oncology and has an appointment tentatively scheduled with them for this week.  The patient was seen by Dr. Dalene Carrow, nurse practitioner Ms. Madilyn Fireman.  I have not heard back but the patient is anxious to go home, so I am going to tell the patient to keep his scheduled point with Dr. Dalene Carrow and then we will follow him up in 2 weeks in the office on Nov 29, 2010 at 3:30, Dr. Gerrit Friends.  DISCHARGE PLAN:  He will resume his preadmission meds which included Crestor 10 mg daily, glucosamine 2 tablets daily, Xanax 1 mg q.8h. p.r.n.  He will continue his Tylenol and hydrocodone/APAP q.4h. p.r.n. for pain as needed.  CONDITION ON DISCHARGE:  Improved.     Eber Hong, P.A.   ______________________________ Velora Heckler, MD    WDJ/MEDQ  D:  11/12/2010  T:  11/12/2010  Job:  [id number]  cc:   Laurice Record, M.D. Fax: [phone]  Electronically Signed by Sherrie George P.A. on 11/12/2010 04:35:09 PM Electronically  Signed by Darnell Level MD on 11/20/2010 12:04:27 PM

## 2010-11-20 NOTE — Op Note (Signed)
[name], [name]                ACCOUNT NO.:  [id number]  MEDICAL RECORD NO.:  [id number]           PATIENT TYPE:  I  LOCATION:  1524                         FACILITY:  [facility]  PHYSICIAN:  Velora Heckler, MD      DATE OF BIRTH:  09/15/1965  DATE OF PROCEDURE:  11/11/2010                               OPERATIVE REPORT   PREOPERATIVE DIAGNOSIS:  Acute cholecystitis, cholelithiasis  POSTOPERATIVE DIAGNOSIS:  Acute cholecystitis, cholelithiasis  PROCEDURE:  Laparoscopic cholecystectomy with intraoperative cholangiography.  SURGEON:  Velora Heckler, MD, FACS  ASSISTANT:  Karie Soda, MD, FACS  ANESTHESIA:  General per Dr. Ronelle Nigh  ESTIMATED BLOOD LOSS:  Minimal.  PREPARATION:  ChloraPrep.  COMPLICATIONS:  None.  INDICATIONS:  The patient is a 45 year old white male admitted from the emergency department with signs and symptoms of acute cholecystitis. The patient presented with a 3-day history of abdominal pain in the right upper quadrant radiating to the back.  Ultrasound showed a thick- walled gallbladder containing sludge and stones.  White blood cell count was elevated at 14000.  The patient was admitted to the surgical service and after consultation with medical oncology was prepared for the operating room.  BODY OF REPORT:  Procedure was done in OR #1 at [facility].  The patient was brought to the operating room, placed in supine position on the operating room table.  Following administration of general anesthesia, the patient was prepped and draped in usual strict aseptic fashion.  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  This was draped out of the field with a Steri-Drape. Incision was made just above the level of the umbilicus in the midline. Dissection was carried down to the fascia and the fascia was incised in the midline and the peritoneal cavity entered cautiously.  A 0 Vicryl pursestring suture was placed in  the fascia.  A Hassan cannula was introduced and the abdomen insufflated with carbon dioxide.  Laparoscope was introduced and the abdomen briefly explored.  There was clear visualization of the right upper quadrant.  Operative ports were placed along the right costal margin in the midline, midclavicular line, and anterior axillary line.  Fundus of the gallbladder was grasped and retracted cephalad.  Omental adhesions to the undersurface of the gallbladder were easily taken down with blunt dissection and hemostasis obtained with electrocautery.  Peritoneum at the neck of the gallbladder was incised.  Cystic duct was dissected out along its length and a clip was placed at the junction of the cystic duct and the gallbladder neck. Cystic artery was dissected out, doubly clipped, and divided.  Cystic duct was incised.  Clear yellow bile emanates from the cystic duct.  A Cook cholangiography catheter was introduced through a stab wound in the right upper quadrant and inserted into the cystic duct.  It was secured with the ligature clip.  Using C-arm fluoroscopy, real time cholangiography was performed.  There was rapid filling of a normal caliber biliary tree.  There was free flow distally into the duodenum without filling defect or obstruction.  There was reflux of contrast  into both the right and left hepatic ductal systems.  Clip was withdrawn and Cook catheter was removed from the peritoneal cavity.  Cystic duct was triply clipped and divided.  Posterior branch of the cystic artery was dissected out, doubly clipped, and divided. Gallbladder was then excised from the gallbladder bed using the hook electrocautery for hemostasis.  Gallbladder was completely resected and placed into an EndoCatch bag.  It was withdrawn through the umbilical port without difficulty.  It was submitted to pathology for review.  Right upper quadrant was irrigated with warm saline and good hemostasis was noted.   Fluid was evacuated.  Ports were removed under direct vision and pneumoperitoneum was released.  Good hemostasis was achieved at all port sites.  0 Vicryl pursestring suture tied securely at the umbilicus. All wounds were anesthetized with local anesthetic.  All wounds were closed with interrupted 4-0 Vicryl subcuticular sutures.  Wounds were washed and dried and Benzoin and Steri-Strips were applied.  Sterile dressings were applied.  The patient was awakened from anesthesia and brought to the recovery room.  The patient tolerated the procedure well.   Velora Heckler, MD, FACS     TMG/MEDQ  D:  11/11/2010  T:  11/11/2010  Job:  [id number]  cc:   Laurice Record, M.D. Fax: [phone]  [name], MD Fax: [phone]  Electronically Signed by Darnell Level MD on 11/20/2010 12:05:18 PM

## 2010-11-22 ENCOUNTER — Ambulatory Visit
Admission: RE | Admit: 2010-11-22 | Discharge: 2010-11-22 | Disposition: A | Discharge status: Home / Self Care | Payer: BC Managed Care – PPO | Source: Ambulatory Visit | Attending: Surgery | Admitting: Surgery

## 2010-11-22 ENCOUNTER — Other Ambulatory Visit (INDEPENDENT_AMBULATORY_CARE_PROVIDER_SITE_OTHER): Payer: Self-pay | Admitting: Surgery

## 2010-11-22 DIAGNOSIS — R1011 Right upper quadrant pain: Secondary | ICD-10-CM

## 2010-12-04 ENCOUNTER — Other Ambulatory Visit: Payer: Self-pay | Admitting: Hematology and Oncology

## 2010-12-04 ENCOUNTER — Encounter (HOSPITAL_BASED_OUTPATIENT_CLINIC_OR_DEPARTMENT_OTHER): Payer: BC Managed Care – PPO | Admitting: Hematology and Oncology

## 2010-12-04 DIAGNOSIS — C20 Malignant neoplasm of rectum: Secondary | ICD-10-CM

## 2010-12-04 LAB — CBC WITH DIFFERENTIAL/PLATELET
Basophils Absolute: 0 10*3/uL (ref 0.0–0.1)
Eosinophils Absolute: 0.2 10*3/uL (ref 0.0–0.5)
HGB: 15 g/dL (ref 13.0–17.1)
MCV: 80.2 fL (ref 79.3–98.0)
MONO%: 7 % (ref 0.0–14.0)
NEUT#: 3.5 10*3/uL (ref 1.5–6.5)
RBC: 5.45 10*6/uL (ref 4.20–5.82)
RDW: 13.7 % (ref 11.0–14.6)
WBC: 5.6 10*3/uL (ref 4.0–10.3)
lymph#: 1.5 10*3/uL (ref 0.9–3.3)

## 2010-12-04 LAB — COMPREHENSIVE METABOLIC PANEL
AST: 24 U/L (ref 0–37)
Albumin: 4.5 g/dL (ref 3.5–5.2)
Alkaline Phosphatase: 58 U/L (ref 39–117)
BUN: 13 mg/dL (ref 6–23)
Calcium: 9.3 mg/dL (ref 8.4–10.5)
Chloride: 104 mEq/L (ref 96–112)
Glucose, Bld: 154 mg/dL — ABNORMAL HIGH (ref 70–99)
Potassium: 4 mEq/L (ref 3.5–5.3)
Sodium: 138 mEq/L (ref 135–145)
Total Protein: 6.8 g/dL (ref 6.0–8.3)

## 2010-12-05 ENCOUNTER — Encounter (HOSPITAL_BASED_OUTPATIENT_CLINIC_OR_DEPARTMENT_OTHER): Payer: BC Managed Care – PPO | Admitting: Hematology and Oncology

## 2010-12-05 DIAGNOSIS — C20 Malignant neoplasm of rectum: Secondary | ICD-10-CM

## 2010-12-05 DIAGNOSIS — Z5111 Encounter for antineoplastic chemotherapy: Secondary | ICD-10-CM

## 2010-12-25 ENCOUNTER — Other Ambulatory Visit: Payer: Self-pay | Admitting: Hematology and Oncology

## 2010-12-25 ENCOUNTER — Encounter (HOSPITAL_BASED_OUTPATIENT_CLINIC_OR_DEPARTMENT_OTHER): Payer: BC Managed Care – PPO | Admitting: Hematology and Oncology

## 2010-12-25 DIAGNOSIS — Z5189 Encounter for other specified aftercare: Secondary | ICD-10-CM

## 2010-12-25 DIAGNOSIS — Z5111 Encounter for antineoplastic chemotherapy: Secondary | ICD-10-CM

## 2010-12-25 DIAGNOSIS — C20 Malignant neoplasm of rectum: Secondary | ICD-10-CM

## 2010-12-25 LAB — CBC WITH DIFFERENTIAL/PLATELET
BASO%: 0.3 % (ref 0.0–2.0)
EOS%: 2.5 % (ref 0.0–7.0)
MCH: 28.8 pg (ref 27.2–33.4)
MCV: 83.2 fL (ref 79.3–98.0)
MONO%: 10.7 % (ref 0.0–14.0)
RBC: 4.83 10*6/uL (ref 4.20–5.82)
RDW: 14.2 % (ref 11.0–14.6)
lymph#: 1.3 10*3/uL (ref 0.9–3.3)

## 2010-12-25 LAB — COMPREHENSIVE METABOLIC PANEL
AST: 25 U/L (ref 0–37)
Albumin: 4.4 g/dL (ref 3.5–5.2)
Alkaline Phosphatase: 59 U/L (ref 39–117)
BUN: 12 mg/dL (ref 6–23)
Creatinine, Ser: 0.99 mg/dL (ref 0.50–1.35)
Glucose, Bld: 101 mg/dL — ABNORMAL HIGH (ref 70–99)
Potassium: 4.2 mEq/L (ref 3.5–5.3)
Total Bilirubin: 0.3 mg/dL (ref 0.3–1.2)

## 2010-12-26 ENCOUNTER — Encounter (HOSPITAL_BASED_OUTPATIENT_CLINIC_OR_DEPARTMENT_OTHER): Payer: BC Managed Care – PPO | Admitting: Hematology and Oncology

## 2010-12-26 DIAGNOSIS — C20 Malignant neoplasm of rectum: Secondary | ICD-10-CM

## 2010-12-26 DIAGNOSIS — Z5111 Encounter for antineoplastic chemotherapy: Secondary | ICD-10-CM

## 2010-12-26 DIAGNOSIS — Z5189 Encounter for other specified aftercare: Secondary | ICD-10-CM

## 2011-01-15 ENCOUNTER — Encounter (HOSPITAL_BASED_OUTPATIENT_CLINIC_OR_DEPARTMENT_OTHER): Payer: BC Managed Care – PPO | Admitting: Hematology and Oncology

## 2011-01-15 ENCOUNTER — Other Ambulatory Visit: Payer: Self-pay | Admitting: Hematology and Oncology

## 2011-01-15 DIAGNOSIS — C20 Malignant neoplasm of rectum: Secondary | ICD-10-CM

## 2011-01-15 DIAGNOSIS — Z5111 Encounter for antineoplastic chemotherapy: Secondary | ICD-10-CM

## 2011-01-15 DIAGNOSIS — Z5189 Encounter for other specified aftercare: Secondary | ICD-10-CM

## 2011-01-15 LAB — COMPREHENSIVE METABOLIC PANEL
Albumin: 4.5 g/dL (ref 3.5–5.2)
BUN: 8 mg/dL (ref 6–23)
Calcium: 9.6 mg/dL (ref 8.4–10.5)
Chloride: 106 mEq/L (ref 96–112)
Creatinine, Ser: 0.9 mg/dL (ref 0.50–1.35)
Glucose, Bld: 95 mg/dL (ref 70–99)
Potassium: 4.7 mEq/L (ref 3.5–5.3)

## 2011-01-15 LAB — CBC WITH DIFFERENTIAL/PLATELET
Basophils Absolute: 0 10*3/uL (ref 0.0–0.1)
Eosinophils Absolute: 0.1 10*3/uL (ref 0.0–0.5)
HCT: 43.4 % (ref 38.4–49.9)
HGB: 15.1 g/dL (ref 13.0–17.1)
MCH: 29.6 pg (ref 27.2–33.4)
MCV: 85 fL (ref 79.3–98.0)
MONO%: 10.2 % (ref 0.0–14.0)
NEUT#: 2.9 10*3/uL (ref 1.5–6.5)
NEUT%: 54.7 % (ref 39.0–75.0)
RDW: 17.7 % — ABNORMAL HIGH (ref 11.0–14.6)
lymph#: 1.7 10*3/uL (ref 0.9–3.3)

## 2011-01-23 ENCOUNTER — Other Ambulatory Visit: Payer: Self-pay | Admitting: Hematology and Oncology

## 2011-01-23 ENCOUNTER — Encounter (HOSPITAL_BASED_OUTPATIENT_CLINIC_OR_DEPARTMENT_OTHER): Payer: BC Managed Care – PPO | Admitting: Hematology and Oncology

## 2011-01-23 DIAGNOSIS — Z5111 Encounter for antineoplastic chemotherapy: Secondary | ICD-10-CM

## 2011-01-23 DIAGNOSIS — C20 Malignant neoplasm of rectum: Secondary | ICD-10-CM

## 2011-01-23 LAB — COMPREHENSIVE METABOLIC PANEL
ALT: 38 U/L (ref 0–53)
AST: 38 U/L — ABNORMAL HIGH (ref 0–37)
Albumin: 4.3 g/dL (ref 3.5–5.2)
Alkaline Phosphatase: 53 U/L (ref 39–117)
BUN: 11 mg/dL (ref 6–23)
CO2: 21 mEq/L (ref 19–32)
Calcium: 9 mg/dL (ref 8.4–10.5)
Chloride: 107 mEq/L (ref 96–112)
Creatinine, Ser: 0.81 mg/dL (ref 0.50–1.35)
Glucose, Bld: 152 mg/dL — ABNORMAL HIGH (ref 70–99)
Potassium: 3.9 mEq/L (ref 3.5–5.3)
Sodium: 140 mEq/L (ref 135–145)
Total Bilirubin: 0.8 mg/dL (ref 0.3–1.2)
Total Protein: 6.7 g/dL (ref 6.0–8.3)

## 2011-01-23 LAB — CBC WITH DIFFERENTIAL/PLATELET
Basophils Absolute: 0 10*3/uL (ref 0.0–0.1)
Eosinophils Absolute: 0.2 10*3/uL (ref 0.0–0.5)
HCT: 42.2 % (ref 38.4–49.9)
HGB: 14.8 g/dL (ref 13.0–17.1)
MONO#: 0.5 10*3/uL (ref 0.1–0.9)
NEUT#: 2.7 10*3/uL (ref 1.5–6.5)
RDW: 18.7 % — ABNORMAL HIGH (ref 11.0–14.6)
lymph#: 1.3 10*3/uL (ref 0.9–3.3)

## 2011-02-12 ENCOUNTER — Other Ambulatory Visit: Payer: Self-pay | Admitting: Hematology and Oncology

## 2011-02-12 ENCOUNTER — Encounter (HOSPITAL_BASED_OUTPATIENT_CLINIC_OR_DEPARTMENT_OTHER): Payer: BC Managed Care – PPO | Admitting: Hematology and Oncology

## 2011-02-12 DIAGNOSIS — C20 Malignant neoplasm of rectum: Secondary | ICD-10-CM

## 2011-02-12 LAB — CBC WITH DIFFERENTIAL/PLATELET
BASO%: 0.3 % (ref 0.0–2.0)
EOS%: 1.7 % (ref 0.0–7.0)
MCH: 31 pg (ref 27.2–33.4)
MCHC: 35.1 g/dL (ref 32.0–36.0)
MONO#: 0.3 10*3/uL (ref 0.1–0.9)
RDW: 19.3 % — ABNORMAL HIGH (ref 11.0–14.6)
WBC: 3.5 10*3/uL — ABNORMAL LOW (ref 4.0–10.3)
lymph#: 0.9 10*3/uL (ref 0.9–3.3)

## 2011-02-12 LAB — COMPREHENSIVE METABOLIC PANEL
ALT: 36 U/L (ref 0–53)
AST: 35 U/L (ref 0–37)
Albumin: 4.2 g/dL (ref 3.5–5.2)
BUN: 8 mg/dL (ref 6–23)
CO2: 21 mEq/L (ref 19–32)
Calcium: 9.3 mg/dL (ref 8.4–10.5)
Chloride: 106 mEq/L (ref 96–112)
Creatinine, Ser: 0.95 mg/dL (ref 0.50–1.35)
Potassium: 4.1 mEq/L (ref 3.5–5.3)

## 2011-02-13 ENCOUNTER — Encounter (HOSPITAL_BASED_OUTPATIENT_CLINIC_OR_DEPARTMENT_OTHER): Payer: BC Managed Care – PPO | Admitting: Hematology and Oncology

## 2011-02-13 DIAGNOSIS — C20 Malignant neoplasm of rectum: Secondary | ICD-10-CM

## 2011-02-13 DIAGNOSIS — Z5111 Encounter for antineoplastic chemotherapy: Secondary | ICD-10-CM

## 2011-03-05 ENCOUNTER — Other Ambulatory Visit: Payer: Self-pay | Admitting: Hematology and Oncology

## 2011-03-05 ENCOUNTER — Encounter (HOSPITAL_BASED_OUTPATIENT_CLINIC_OR_DEPARTMENT_OTHER): Payer: BC Managed Care – PPO | Admitting: Hematology and Oncology

## 2011-03-05 DIAGNOSIS — C20 Malignant neoplasm of rectum: Secondary | ICD-10-CM

## 2011-03-05 LAB — CBC WITH DIFFERENTIAL/PLATELET
BASO%: 0.5 % (ref 0.0–2.0)
HCT: 42.6 % (ref 38.4–49.9)
HGB: 15.1 g/dL (ref 13.0–17.1)
LYMPH%: 26.3 % (ref 14.0–49.0)
MCHC: 35.4 g/dL (ref 32.0–36.0)
MONO%: 11 % (ref 0.0–14.0)
NEUT#: 2.6 10*3/uL (ref 1.5–6.5)
RBC: 4.68 10*6/uL (ref 4.20–5.82)
WBC: 4.3 10*3/uL (ref 4.0–10.3)

## 2011-03-05 LAB — COMPREHENSIVE METABOLIC PANEL
AST: 42 U/L — ABNORMAL HIGH (ref 0–37)
Alkaline Phosphatase: 63 U/L (ref 39–117)
BUN: 10 mg/dL (ref 6–23)
Glucose, Bld: 99 mg/dL (ref 70–99)
Potassium: 4.6 mEq/L (ref 3.5–5.3)
Sodium: 141 mEq/L (ref 135–145)
Total Bilirubin: 0.7 mg/dL (ref 0.3–1.2)

## 2011-03-06 ENCOUNTER — Encounter (HOSPITAL_BASED_OUTPATIENT_CLINIC_OR_DEPARTMENT_OTHER): Payer: BC Managed Care – PPO | Admitting: Hematology and Oncology

## 2011-03-06 DIAGNOSIS — C20 Malignant neoplasm of rectum: Secondary | ICD-10-CM

## 2011-03-06 DIAGNOSIS — Z5111 Encounter for antineoplastic chemotherapy: Secondary | ICD-10-CM

## 2011-03-14 ENCOUNTER — Emergency Department (HOSPITAL_COMMUNITY): Payer: BC Managed Care – PPO

## 2011-03-14 ENCOUNTER — Emergency Department (HOSPITAL_COMMUNITY)
Admission: EM | Admit: 2011-03-14 | Discharge: 2011-03-15 | Disposition: A | Discharge status: Home / Self Care | Payer: BC Managed Care – PPO | Attending: Emergency Medicine | Admitting: Emergency Medicine

## 2011-03-14 DIAGNOSIS — J069 Acute upper respiratory infection, unspecified: Secondary | ICD-10-CM | POA: Insufficient documentation

## 2011-03-14 DIAGNOSIS — J029 Acute pharyngitis, unspecified: Secondary | ICD-10-CM | POA: Insufficient documentation

## 2011-03-14 DIAGNOSIS — R509 Fever, unspecified: Secondary | ICD-10-CM | POA: Insufficient documentation

## 2011-03-14 DIAGNOSIS — C20 Malignant neoplasm of rectum: Secondary | ICD-10-CM | POA: Insufficient documentation

## 2011-03-14 DIAGNOSIS — Z9221 Personal history of antineoplastic chemotherapy: Secondary | ICD-10-CM | POA: Insufficient documentation

## 2011-03-14 DIAGNOSIS — F411 Generalized anxiety disorder: Secondary | ICD-10-CM | POA: Insufficient documentation

## 2011-03-14 LAB — COMPREHENSIVE METABOLIC PANEL
Alkaline Phosphatase: 80 U/L (ref 39–117)
BUN: 10 mg/dL (ref 6–23)
CO2: 23 mEq/L (ref 19–32)
Chloride: 102 mEq/L (ref 96–112)
Creatinine, Ser: 0.83 mg/dL (ref 0.50–1.35)
GFR calc Af Amer: >60 mL/min (ref >60)
GFR calc non Af Amer: >60 mL/min (ref >60)
Glucose, Bld: 105 mg/dL — ABNORMAL HIGH (ref 70–99)
Potassium: 3.3 mEq/L — ABNORMAL LOW (ref 3.5–5.1)
Total Bilirubin: 0.7 mg/dL (ref 0.3–1.2)

## 2011-03-14 LAB — URINALYSIS, ROUTINE W REFLEX MICROSCOPIC
Bilirubin Urine: NEGATIVE
Glucose, UA: NEGATIVE mg/dL
Hgb urine dipstick: NEGATIVE
Specific Gravity, Urine: 1.008 (ref 1.005–1.030)

## 2011-03-15 LAB — RAPID STREP SCREEN (MED CTR MEBANE ONLY): Streptococcus, Group A Screen (Direct): NEGATIVE

## 2011-03-15 LAB — CBC
HCT: 37.1 % — ABNORMAL LOW (ref 39.0–52.0)
Hemoglobin: 13.4 g/dL (ref 13.0–17.0)
MCV: 87.9 fL (ref 78.0–100.0)
RBC: 4.22 MIL/uL (ref 4.22–5.81)
RDW: 15.9 % — ABNORMAL HIGH (ref 11.5–15.5)
WBC: 6 10*3/uL (ref 4.0–10.5)

## 2011-03-15 LAB — DIFFERENTIAL
Basophils Relative: 1 % (ref 0–1)
Eosinophils Absolute: 0.1 10*3/uL (ref 0.0–0.7)
Eosinophils Relative: 2 % (ref 0–5)
Lymphocytes Relative: 21 % (ref 12–46)
Monocytes Relative: 15 % — ABNORMAL HIGH (ref 3–12)
Neutro Abs: 3.6 10*3/uL (ref 1.7–7.7)
Neutrophils Relative %: 61 % (ref 43–77)

## 2011-03-16 LAB — URINE CULTURE: Culture: NO GROWTH

## 2011-03-21 LAB — CULTURE, BLOOD (ROUTINE X 2)
Culture  Setup Time: 201209070607
Culture: NO GROWTH

## 2011-03-27 ENCOUNTER — Other Ambulatory Visit: Payer: Self-pay | Admitting: Hematology and Oncology

## 2011-03-27 ENCOUNTER — Ambulatory Visit (HOSPITAL_COMMUNITY)
Admission: RE | Admit: 2011-03-27 | Discharge: 2011-03-27 | Disposition: A | Discharge status: Home / Self Care | Payer: BC Managed Care – PPO | Source: Ambulatory Visit | Attending: Hematology and Oncology | Admitting: Hematology and Oncology

## 2011-03-27 ENCOUNTER — Encounter (HOSPITAL_BASED_OUTPATIENT_CLINIC_OR_DEPARTMENT_OTHER): Payer: BC Managed Care – PPO | Admitting: Hematology and Oncology

## 2011-03-27 DIAGNOSIS — C20 Malignant neoplasm of rectum: Secondary | ICD-10-CM

## 2011-03-27 DIAGNOSIS — J984 Other disorders of lung: Secondary | ICD-10-CM | POA: Insufficient documentation

## 2011-03-27 DIAGNOSIS — Z932 Ileostomy status: Secondary | ICD-10-CM | POA: Insufficient documentation

## 2011-03-27 DIAGNOSIS — Z5111 Encounter for antineoplastic chemotherapy: Secondary | ICD-10-CM

## 2011-03-27 LAB — CBC WITH DIFFERENTIAL/PLATELET
Basophils Absolute: 0 10*3/uL (ref 0.0–0.1)
EOS%: 2.5 % (ref 0.0–7.0)
Eosinophils Absolute: 0.1 10*3/uL (ref 0.0–0.5)
HCT: 44.3 % (ref 38.4–49.9)
HGB: 15.6 g/dL (ref 13.0–17.1)
MCH: 32.2 pg (ref 27.2–33.4)
MCV: 91.6 fL (ref 79.3–98.0)
MONO%: 10.2 % (ref 0.0–14.0)
NEUT#: 2.7 10*3/uL (ref 1.5–6.5)
NEUT%: 58.3 % (ref 39.0–75.0)
RDW: 17.1 % — ABNORMAL HIGH (ref 11.0–14.6)
lymph#: 1.3 10*3/uL (ref 0.9–3.3)

## 2011-03-27 LAB — COMPREHENSIVE METABOLIC PANEL
Albumin: 4.2 g/dL (ref 3.5–5.2)
BUN: 9 mg/dL (ref 6–23)
Calcium: 9.2 mg/dL (ref 8.4–10.5)
Chloride: 105 mEq/L (ref 96–112)
Creatinine, Ser: 0.81 mg/dL (ref 0.50–1.35)
Glucose, Bld: 116 mg/dL — ABNORMAL HIGH (ref 70–99)
Potassium: 4.4 mEq/L (ref 3.5–5.3)

## 2011-03-27 MED ORDER — IOHEXOL 300 MG/ML  SOLN
100.0000 mL | Freq: Once | INTRAMUSCULAR | Status: AC | PRN
  Administered 2011-03-27: 100 mL via INTRAVENOUS

## 2011-03-28 ENCOUNTER — Other Ambulatory Visit: Payer: Self-pay | Admitting: Hematology and Oncology

## 2011-03-28 DIAGNOSIS — C20 Malignant neoplasm of rectum: Secondary | ICD-10-CM

## 2011-04-03 ENCOUNTER — Encounter (HOSPITAL_COMMUNITY): Payer: Self-pay

## 2011-04-03 ENCOUNTER — Encounter (HOSPITAL_COMMUNITY)
Admission: RE | Admit: 2011-04-03 | Discharge: 2011-04-03 | Disposition: A | Discharge status: Home / Self Care | Payer: BC Managed Care – PPO | Source: Ambulatory Visit | Attending: Hematology and Oncology | Admitting: Hematology and Oncology

## 2011-04-03 ENCOUNTER — Encounter (HOSPITAL_BASED_OUTPATIENT_CLINIC_OR_DEPARTMENT_OTHER): Payer: BC Managed Care – PPO | Admitting: Hematology and Oncology

## 2011-04-03 DIAGNOSIS — C20 Malignant neoplasm of rectum: Secondary | ICD-10-CM

## 2011-04-03 DIAGNOSIS — K7689 Other specified diseases of liver: Secondary | ICD-10-CM | POA: Insufficient documentation

## 2011-04-03 DIAGNOSIS — N200 Calculus of kidney: Secondary | ICD-10-CM | POA: Insufficient documentation

## 2011-04-03 DIAGNOSIS — Z932 Ileostomy status: Secondary | ICD-10-CM | POA: Insufficient documentation

## 2011-04-03 MED ORDER — FLUDEOXYGLUCOSE F - 18 (FDG) INJECTION
18.4000 | Freq: Once | INTRAVENOUS | Status: AC | PRN
  Administered 2011-04-03: 18.4 via INTRAVENOUS

## 2011-04-04 LAB — GLUCOSE, CAPILLARY: Glucose-Capillary: 112 mg/dL — ABNORMAL HIGH (ref 70–99)

## 2011-04-08 ENCOUNTER — Ambulatory Visit
Admission: RE | Admit: 2011-04-08 | Discharge: 2011-04-08 | Disposition: A | Discharge status: Home / Self Care | Payer: BC Managed Care – PPO | Source: Ambulatory Visit | Attending: Radiation Oncology | Admitting: Radiation Oncology

## 2011-04-08 DIAGNOSIS — R141 Gas pain: Secondary | ICD-10-CM | POA: Insufficient documentation

## 2011-04-08 DIAGNOSIS — R142 Eructation: Secondary | ICD-10-CM | POA: Insufficient documentation

## 2011-04-08 DIAGNOSIS — Z510 Encounter for antineoplastic radiation therapy: Secondary | ICD-10-CM | POA: Insufficient documentation

## 2011-04-08 DIAGNOSIS — C20 Malignant neoplasm of rectum: Secondary | ICD-10-CM | POA: Insufficient documentation

## 2011-04-09 ENCOUNTER — Encounter: Payer: Self-pay | Admitting: *Deleted

## 2011-04-11 ENCOUNTER — Ambulatory Visit: Payer: BC Managed Care – PPO | Admitting: Radiation Oncology

## 2011-04-12 ENCOUNTER — Other Ambulatory Visit: Payer: Self-pay | Admitting: Hematology and Oncology

## 2011-04-12 ENCOUNTER — Encounter (HOSPITAL_BASED_OUTPATIENT_CLINIC_OR_DEPARTMENT_OTHER): Payer: BC Managed Care – PPO | Admitting: Hematology and Oncology

## 2011-04-12 DIAGNOSIS — Z5111 Encounter for antineoplastic chemotherapy: Secondary | ICD-10-CM

## 2011-04-12 DIAGNOSIS — Z23 Encounter for immunization: Secondary | ICD-10-CM

## 2011-04-12 DIAGNOSIS — C20 Malignant neoplasm of rectum: Secondary | ICD-10-CM

## 2011-04-12 LAB — CBC WITH DIFFERENTIAL/PLATELET
BASO%: 0.6 % (ref 0.0–2.0)
Eosinophils Absolute: 0.1 10*3/uL (ref 0.0–0.5)
LYMPH%: 32.3 % (ref 14.0–49.0)
MCHC: 35.3 g/dL (ref 32.0–36.0)
MONO#: 0.5 10*3/uL (ref 0.1–0.9)
NEUT#: 2.1 10*3/uL (ref 1.5–6.5)
RBC: 4.45 10*6/uL (ref 4.20–5.82)
RDW: 14.3 % (ref 11.0–14.6)
WBC: 4.1 10*3/uL (ref 4.0–10.3)
lymph#: 1.3 10*3/uL (ref 0.9–3.3)

## 2011-04-12 LAB — COMPREHENSIVE METABOLIC PANEL
ALT: 32 U/L (ref 0–53)
Albumin: 4.3 g/dL (ref 3.5–5.2)
CO2: 23 mEq/L (ref 19–32)
Chloride: 107 mEq/L (ref 96–112)
Potassium: 4.3 mEq/L (ref 3.5–5.3)
Sodium: 141 mEq/L (ref 135–145)
Total Bilirubin: 0.6 mg/dL (ref 0.3–1.2)
Total Protein: 6.6 g/dL (ref 6.0–8.3)

## 2011-04-16 ENCOUNTER — Encounter (HOSPITAL_BASED_OUTPATIENT_CLINIC_OR_DEPARTMENT_OTHER): Payer: BC Managed Care – PPO | Admitting: Hematology and Oncology

## 2011-04-16 DIAGNOSIS — Z5111 Encounter for antineoplastic chemotherapy: Secondary | ICD-10-CM

## 2011-04-16 DIAGNOSIS — C20 Malignant neoplasm of rectum: Secondary | ICD-10-CM

## 2011-04-17 ENCOUNTER — Encounter: Payer: Self-pay | Admitting: *Deleted

## 2011-04-22 LAB — HEPARIN LEVEL (UNFRACTIONATED)
Heparin Unfractionated: 0.37
Heparin Unfractionated: 0.42
Heparin Unfractionated: 0.65

## 2011-04-22 LAB — CBC
HCT: 39.8
HCT: 42
Hemoglobin: 14.2
MCHC: 34.4
MCHC: 34.4
MCHC: 34.7
MCHC: 35.1
MCV: 81.3
MCV: 81.5
MCV: 82.3
MCV: 82.5
MCV: 83.3
Platelets: 203
Platelets: 215
Platelets: 226
Platelets: 229
RBC: 5
RBC: 5.02
RDW: 12.7
RDW: 12.8
RDW: 12.8
RDW: 12.9
RDW: 13
WBC: 7
WBC: 8.2

## 2011-04-22 LAB — COMPREHENSIVE METABOLIC PANEL
AST: 21
BUN: 13
CO2: 28
Calcium: 8.9
Chloride: 108
Creatinine, Ser: 0.97
GFR calc non Af Amer: >60
Glucose, Bld: 103 — ABNORMAL HIGH
Total Bilirubin: 0.7

## 2011-04-22 LAB — DIFFERENTIAL
Basophils Absolute: 0
Eosinophils Relative: 2
Lymphocytes Relative: 24
Lymphs Abs: 1.7
Neutro Abs: 4.6
Neutrophils Relative %: 65

## 2011-04-22 LAB — URIC ACID: Uric Acid, Serum: 7.3 — ABNORMAL HIGH

## 2011-04-22 LAB — PROTIME-INR
INR: 1
Prothrombin Time: 13.2

## 2011-04-22 LAB — BASIC METABOLIC PANEL
BUN: 13
CO2: 27
Calcium: 9
Calcium: 9.1
Creatinine, Ser: 0.94
Creatinine, Ser: 1.03
GFR calc Af Amer: >60
GFR calc non Af Amer: >60
GFR calc non Af Amer: >60

## 2011-04-22 LAB — CARDIAC PANEL(CRET KIN+CKTOT+MB+TROPI)
Relative Index: INVALID
Relative Index: INVALID
Total CK: 87
Troponin I: 0.02

## 2011-04-22 LAB — LIPID PANEL
HDL: 29 — ABNORMAL LOW
Total CHOL/HDL Ratio: 7.2

## 2011-04-22 LAB — POCT CARDIAC MARKERS
Myoglobin, poc: 45.8
Myoglobin, poc: 53.6
Operator id: 1415
Operator id: 1415
Troponin i, poc: <0.05

## 2011-04-22 LAB — TROPONIN I: Troponin I: 0.01

## 2011-04-22 LAB — CK TOTAL AND CKMB (NOT AT ARMC): Total CK: 130

## 2011-04-23 ENCOUNTER — Other Ambulatory Visit: Payer: Self-pay | Admitting: Hematology and Oncology

## 2011-04-23 ENCOUNTER — Encounter (HOSPITAL_BASED_OUTPATIENT_CLINIC_OR_DEPARTMENT_OTHER): Payer: BC Managed Care – PPO | Admitting: Hematology and Oncology

## 2011-04-23 DIAGNOSIS — Z5111 Encounter for antineoplastic chemotherapy: Secondary | ICD-10-CM

## 2011-04-23 DIAGNOSIS — C20 Malignant neoplasm of rectum: Secondary | ICD-10-CM

## 2011-04-23 DIAGNOSIS — R21 Rash and other nonspecific skin eruption: Secondary | ICD-10-CM

## 2011-04-23 LAB — CBC WITH DIFFERENTIAL/PLATELET
BASO%: 0.2 % (ref 0.0–2.0)
Basophils Absolute: 0 10*3/uL (ref 0.0–0.1)
EOS%: 3.1 % (ref 0.0–7.0)
HGB: 15.2 g/dL (ref 13.0–17.1)
MCH: 31 pg (ref 27.2–33.4)
NEUT#: 2 10*3/uL (ref 1.5–6.5)
RDW: 13.6 % (ref 11.0–14.6)
lymph#: 0.7 10*3/uL — ABNORMAL LOW (ref 0.9–3.3)

## 2011-04-23 LAB — BASIC METABOLIC PANEL
BUN: 11 mg/dL (ref 6–23)
Chloride: 106 mEq/L (ref 96–112)
Potassium: 4 mEq/L (ref 3.5–5.3)

## 2011-04-24 ENCOUNTER — Emergency Department (HOSPITAL_COMMUNITY)
Admission: EM | Admit: 2011-04-24 | Discharge: 2011-04-24 | Disposition: A | Discharge status: Home / Self Care | Payer: BC Managed Care – PPO | Attending: Emergency Medicine | Admitting: Emergency Medicine

## 2011-04-24 DIAGNOSIS — T82898A Other specified complication of vascular prosthetic devices, implants and grafts, initial encounter: Secondary | ICD-10-CM | POA: Insufficient documentation

## 2011-04-24 DIAGNOSIS — Z0000 Encounter for general adult medical examination without abnormal findings: Secondary | ICD-10-CM | POA: Insufficient documentation

## 2011-04-24 DIAGNOSIS — C189 Malignant neoplasm of colon, unspecified: Secondary | ICD-10-CM | POA: Insufficient documentation

## 2011-04-24 DIAGNOSIS — Y849 Medical procedure, unspecified as the cause of abnormal reaction of the patient, or of later complication, without mention of misadventure at the time of the procedure: Secondary | ICD-10-CM | POA: Insufficient documentation

## 2011-04-25 ENCOUNTER — Encounter: Payer: Self-pay | Admitting: Radiation Oncology

## 2011-04-27 ENCOUNTER — Other Ambulatory Visit: Payer: Self-pay | Admitting: Hematology and Oncology

## 2011-04-27 DIAGNOSIS — C20 Malignant neoplasm of rectum: Secondary | ICD-10-CM | POA: Insufficient documentation

## 2011-04-30 ENCOUNTER — Other Ambulatory Visit: Payer: Self-pay | Admitting: Hematology and Oncology

## 2011-04-30 ENCOUNTER — Encounter (HOSPITAL_BASED_OUTPATIENT_CLINIC_OR_DEPARTMENT_OTHER): Payer: BC Managed Care – PPO | Admitting: Hematology and Oncology

## 2011-04-30 DIAGNOSIS — Z9089 Acquired absence of other organs: Secondary | ICD-10-CM

## 2011-04-30 DIAGNOSIS — Z5111 Encounter for antineoplastic chemotherapy: Secondary | ICD-10-CM

## 2011-04-30 DIAGNOSIS — C20 Malignant neoplasm of rectum: Secondary | ICD-10-CM

## 2011-04-30 DIAGNOSIS — Z79899 Other long term (current) drug therapy: Secondary | ICD-10-CM

## 2011-04-30 LAB — CBC WITH DIFFERENTIAL/PLATELET
Basophils Absolute: 0 10*3/uL (ref 0.0–0.1)
Eosinophils Absolute: 0.1 10*3/uL (ref 0.0–0.5)
HCT: 42.2 % (ref 38.4–49.9)
HGB: 15.3 g/dL (ref 13.0–17.1)
LYMPH%: 21.4 % (ref 14.0–49.0)
MCV: 84.9 fL (ref 79.3–98.0)
MONO%: 12.9 % (ref 0.0–14.0)
NEUT#: 2.4 10*3/uL (ref 1.5–6.5)
NEUT%: 61.8 % (ref 39.0–75.0)
Platelets: 118 10*3/uL — ABNORMAL LOW (ref 140–400)

## 2011-04-30 LAB — BASIC METABOLIC PANEL
BUN: 11 mg/dL (ref 6–23)
Calcium: 9.1 mg/dL (ref 8.4–10.5)
Glucose, Bld: 130 mg/dL — ABNORMAL HIGH (ref 70–99)

## 2011-05-03 ENCOUNTER — Encounter: Payer: Self-pay | Admitting: *Deleted

## 2011-05-06 ENCOUNTER — Encounter: Payer: Self-pay | Admitting: *Deleted

## 2011-05-07 ENCOUNTER — Other Ambulatory Visit: Payer: Self-pay | Admitting: Hematology and Oncology

## 2011-05-07 ENCOUNTER — Encounter (HOSPITAL_BASED_OUTPATIENT_CLINIC_OR_DEPARTMENT_OTHER): Payer: BC Managed Care – PPO | Admitting: Hematology and Oncology

## 2011-05-07 DIAGNOSIS — Z5111 Encounter for antineoplastic chemotherapy: Secondary | ICD-10-CM

## 2011-05-07 DIAGNOSIS — C20 Malignant neoplasm of rectum: Secondary | ICD-10-CM

## 2011-05-07 LAB — CBC WITH DIFFERENTIAL/PLATELET
BASO%: 0.6 % (ref 0.0–2.0)
EOS%: 2.5 % (ref 0.0–7.0)
MCH: 30.6 pg (ref 27.2–33.4)
MCHC: 35.9 g/dL (ref 32.0–36.0)
MONO#: 0.7 10*3/uL (ref 0.1–0.9)
NEUT%: 58.7 % (ref 39.0–75.0)
RBC: 4.81 10*6/uL (ref 4.20–5.82)
RDW: 13.8 % (ref 11.0–14.6)
WBC: 3.2 10*3/uL — ABNORMAL LOW (ref 4.0–10.3)
lymph#: 0.5 10*3/uL — ABNORMAL LOW (ref 0.9–3.3)
nRBC: 0 % (ref 0–0)

## 2011-05-10 ENCOUNTER — Other Ambulatory Visit: Payer: Self-pay | Admitting: Hematology and Oncology

## 2011-05-12 ENCOUNTER — Other Ambulatory Visit: Payer: Self-pay | Admitting: Hematology and Oncology

## 2011-05-13 ENCOUNTER — Ambulatory Visit
Admission: RE | Admit: 2011-05-13 | Discharge: 2011-05-13 | Disposition: A | Discharge status: Home / Self Care | Payer: BC Managed Care – PPO | Source: Ambulatory Visit | Attending: Radiation Oncology | Admitting: Radiation Oncology

## 2011-05-14 ENCOUNTER — Ambulatory Visit (HOSPITAL_BASED_OUTPATIENT_CLINIC_OR_DEPARTMENT_OTHER): Payer: BC Managed Care – PPO

## 2011-05-14 ENCOUNTER — Ambulatory Visit (HOSPITAL_BASED_OUTPATIENT_CLINIC_OR_DEPARTMENT_OTHER): Payer: BC Managed Care – PPO | Admitting: Physician Assistant

## 2011-05-14 ENCOUNTER — Ambulatory Visit
Admission: RE | Admit: 2011-05-14 | Discharge: 2011-05-14 | Disposition: A | Discharge status: Home / Self Care | Payer: BC Managed Care – PPO | Source: Ambulatory Visit | Attending: Radiation Oncology | Admitting: Radiation Oncology

## 2011-05-14 ENCOUNTER — Other Ambulatory Visit (HOSPITAL_BASED_OUTPATIENT_CLINIC_OR_DEPARTMENT_OTHER): Payer: BC Managed Care – PPO | Admitting: Lab

## 2011-05-14 ENCOUNTER — Other Ambulatory Visit: Payer: Self-pay | Admitting: Hematology and Oncology

## 2011-05-14 VITALS — BP 125/76 | HR 77 | Temp 98.6°F | Resp 20 | Ht 72.0 in | Wt 215.4 lb

## 2011-05-14 DIAGNOSIS — Z5111 Encounter for antineoplastic chemotherapy: Secondary | ICD-10-CM

## 2011-05-14 DIAGNOSIS — K469 Unspecified abdominal hernia without obstruction or gangrene: Secondary | ICD-10-CM

## 2011-05-14 DIAGNOSIS — C20 Malignant neoplasm of rectum: Secondary | ICD-10-CM

## 2011-05-14 DIAGNOSIS — R1031 Right lower quadrant pain: Secondary | ICD-10-CM

## 2011-05-14 LAB — CBC WITH DIFFERENTIAL/PLATELET
Basophils Absolute: 0 10*3/uL (ref 0.0–0.1)
Eosinophils Absolute: 0.1 10*3/uL (ref 0.0–0.5)
HGB: 13.5 g/dL (ref 13.0–17.1)
LYMPH%: 14.6 % (ref 14.0–49.0)
MONO#: 0.5 10*3/uL (ref 0.1–0.9)
NEUT#: 3 10*3/uL (ref 1.5–6.5)
Platelets: 167 10*3/uL (ref 140–400)
RBC: 4.44 10*6/uL (ref 4.20–5.82)
WBC: 4.3 10*3/uL (ref 4.0–10.3)
nRBC: 0 % (ref 0–0)

## 2011-05-14 LAB — BASIC METABOLIC PANEL
BUN: 13 mg/dL (ref 6–23)
Potassium: 3.8 mEq/L (ref 3.5–5.3)
Sodium: 139 mEq/L (ref 135–145)

## 2011-05-14 MED ORDER — SODIUM CHLORIDE 0.9 % IJ SOLN
10.0000 mL | INTRAMUSCULAR | Status: DC | PRN
  Administered 2011-05-14: 10 mL
  Filled 2011-05-14: qty 10

## 2011-05-14 MED ORDER — FLUOROURACIL CHEMO INJECTION 5 GM/100ML
225.0000 mg/m2/d | INTRAVENOUS | Status: DC
  Administered 2011-05-14: 3500 mg via INTRAVENOUS
  Filled 2011-05-14: qty 70

## 2011-05-14 MED ORDER — HEPARIN SOD (PORK) LOCK FLUSH 100 UNIT/ML IV SOLN
500.0000 [IU] | Freq: Once | INTRAVENOUS | Status: AC | PRN
  Administered 2011-05-14: 500 [IU]
  Filled 2011-05-14: qty 5

## 2011-05-14 NOTE — Progress Notes (Signed)
This office note has been dictated.

## 2011-05-14 NOTE — Progress Notes (Signed)
CC:   Anselmo Rod, MD, Yehuda Savannah, M.D. [name], M.D. Velora Heckler, MD [name], M.D. [name], MD  IDENTIFYING STATEMENT:  Mr. [name] is a 45 year old white male with rectal cancer who presents for followup prior to his next continuous infusion 5-FU pump change.  INTERIM HISTORY:  [name] reports since his last clinic visit on April 30, 2011 he has had intermittent fatigue, but is able to complete ADLs.  No fevers, chills, or night sweats.  No dyspnea or cough.  He has normal appetite and has had no issues with nausea, vomiting, constipation, or diarrhea.  He does report issues with right lower quadrant abdominal discomfort at the site of his ileostomy and he has been taking p.r.n. oxycodone periodically for this.  He has not scheduled a followup with Dr. Epifania Gore, his surgeon at [facility].  He is not having any dysuria, no frequency or hematuria.  No alteration in sensation or balance or swelling of extremities.  He does note intermittent bilateral hip discomfort and states this is usually if he has been sitting in one position for long periods.  He has not had any pain, tenderness or swelling at his Port-A-Cath site.  Current medications are reviewed and recorded.  PHYSICAL EXAMINATION:  Temperature is 98.6, heart rate 77, respirations 20, blood pressure 125/76, weight 215.6 pounds.  General:  This is a well-developed, well-nourished white male in no acute distress.  HEENT: Sclerae nonicteric.  There is no oral thrush or mucositis.  Skin: Without rashes or lesions.  Lymph:  No cervical, supraclavicular, axillary, or inguinal lymphadenopathy.  Cardiac:  Regular rate and rhythm without murmurs or gallops.  Peripheral pulses are 2+.  There is a right subclavian Port-A-Cath without signs of infection.  Chest: Lungs clear to auscultation.  Abdomen:  Positive bowel sounds.  Soft, nontender, nondistended.  There is no organomegaly.  He has  a right lower quadrant ileostomy with hernia noted.  Extremities:  Without edema, cyanosis or calf tenderness.  Neurologic:  Alert and oriented times 3.  Strength, sensation, and coordination all grossly intact.  LABORATORY DATA:  CBC with diff reveals white blood count of 4.3, hemoglobin 13.5, hematocrit 37.8, platelets of 167, ANC of 3.0 and MCV of 85.1.  BMET is currently pending.  IMPRESSION/PLAN: 1. 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  He is status post 6 cycles of     oxaliplatin based therapy with 2 cycles of FOLFOX and 4 cycles of     XELOX.  He began pelvic external radiation therapy with continuous     infusion 5-FU on April 16, 2011 which he is tolerating with grade     1 fatigue.  He is due for his next 5-FU pump change today.     Reinforced teaching of treatment potential side effects and     management side effects with emphasis on potential for fatigue,     myelosuppression, mucositis, nausea, vomiting, diarrhea, hand-foot     syndrome, and the patient verbalized understanding. 2. The patient reports right lower quadrant discomfort behind his     ileostomy where he has a hernia.  He feels that the ileostomy is     functioning normally, but he has had increased discomfort in  this     area.  We will go ahead and refill the patient's oxycodone 5 mg     tablets which he takes 1 p.o. every 4 hours p.r.n. pain.  He is     given a quantity of 30.  He is also advised that he would need to     contact Dr. [name], his surgeon for further evaluation and     management, and he verbalizes understanding of this. 3. The patient will keep his already scheduled followup appointment in     1 week's time at which time we will reassess CBC with diff and     BMET.  We anticipate the patient  completing chemotherapy that same     week.  He is advised to call in the interim if any questions or     problems.    ______________________________ Sherilyn Banker, MSN, ANP, BC RJ/MEDQ  D:  05/14/2011  T:  05/14/2011  Job:  [id number]

## 2011-05-15 ENCOUNTER — Ambulatory Visit
Admission: RE | Admit: 2011-05-15 | Discharge: 2011-05-15 | Disposition: A | Discharge status: Home / Self Care | Payer: BC Managed Care – PPO | Source: Ambulatory Visit | Attending: Radiation Oncology | Admitting: Radiation Oncology

## 2011-05-15 ENCOUNTER — Encounter: Payer: Self-pay | Admitting: Hematology and Oncology

## 2011-05-16 ENCOUNTER — Ambulatory Visit
Admission: RE | Admit: 2011-05-16 | Discharge: 2011-05-16 | Disposition: A | Discharge status: Home / Self Care | Payer: BC Managed Care – PPO | Source: Ambulatory Visit | Attending: Radiation Oncology | Admitting: Radiation Oncology

## 2011-05-17 ENCOUNTER — Ambulatory Visit
Admission: RE | Admit: 2011-05-17 | Discharge: 2011-05-17 | Disposition: A | Discharge status: Home / Self Care | Payer: BC Managed Care – PPO | Source: Ambulatory Visit | Attending: Radiation Oncology | Admitting: Radiation Oncology

## 2011-05-17 ENCOUNTER — Encounter: Payer: Self-pay | Admitting: Radiation Oncology

## 2011-05-17 DIAGNOSIS — C20 Malignant neoplasm of rectum: Secondary | ICD-10-CM

## 2011-05-17 NOTE — Progress Notes (Signed)
outpatient  [facility] Radiation Oncology  Weekly Radiation Treatment Note   Name: [name] MRN: [id number] DOB: 1965/11/16  Current  dose: 43.2 Gy Planned dose: 50.4 Gy   NARRATIVE: [name] was seen today for weekly treatment management. The chart was checked and port films images were reviewed. The patient notes occasional bouts of loose stool and has attributed these to some dietary etiologies including a salad and/or pizza. The patient also notes increased urinary frequency but denies dysuria. He is taking and increase water. He has cut back on sodas.  PHYSICAL EXAMINATION: The patient is in no acute distress today. Vital signs were recorded. The patient's current weight was reviewed and compared with last week. The patient's weight was 215 pounds.  ASSESSMENT: Patient tolerating treatments well. He seems to be getting along well.  PLAN: Continue treatment as planned.  We will complete radiation after 4 more fractions as planned.  ---------------------------------- Artist Pais Kathrynn Running, M.D. Radiation Oncology [phone]

## 2011-05-17 NOTE — Progress Notes (Signed)
Pt reports urinary freq. bm's nl, no nausea

## 2011-05-20 ENCOUNTER — Encounter: Payer: Self-pay | Admitting: Radiation Oncology

## 2011-05-20 ENCOUNTER — Ambulatory Visit
Admission: RE | Admit: 2011-05-20 | Discharge: 2011-05-20 | Disposition: A | Discharge status: Home / Self Care | Payer: BC Managed Care – PPO | Source: Ambulatory Visit | Attending: Radiation Oncology | Admitting: Radiation Oncology

## 2011-05-20 DIAGNOSIS — C20 Malignant neoplasm of rectum: Secondary | ICD-10-CM

## 2011-05-20 NOTE — Progress Notes (Signed)
[facility] Radiation Oncology Simulation Verification Note   Name: [name] MRN: [id number]  Date: 05/20/2011  DOB: 1966/04/22  Status:outpatient    DIAGNOSIS: There were no encounter diagnoses. Rectal cancer   Primary site: Colon and Rectum   Staging method: AJCC 7th Edition   Clinical: Stage IIIA (T2, N1, M0) signed by Lorre Munroe. Madilyn Fireman, NP on 04/27/2011  9:00 AM   Summary: Stage IIIA (T2, N1, M0)   POSITION: Patient is positioned prone and  Isocenter was reviewed.  MLCs were checked Treatment was approved.  NARRATIVE: I reviewed the set-up and approved the boost fields.

## 2011-05-21 ENCOUNTER — Other Ambulatory Visit (HOSPITAL_BASED_OUTPATIENT_CLINIC_OR_DEPARTMENT_OTHER): Payer: BC Managed Care – PPO | Admitting: Hematology and Oncology

## 2011-05-21 ENCOUNTER — Ambulatory Visit: Payer: BC Managed Care – PPO

## 2011-05-21 ENCOUNTER — Ambulatory Visit (HOSPITAL_BASED_OUTPATIENT_CLINIC_OR_DEPARTMENT_OTHER): Payer: BC Managed Care – PPO

## 2011-05-21 ENCOUNTER — Other Ambulatory Visit: Payer: Self-pay | Admitting: Physician Assistant

## 2011-05-21 ENCOUNTER — Ambulatory Visit
Admission: RE | Admit: 2011-05-21 | Discharge: 2011-05-21 | Disposition: A | Discharge status: Home / Self Care | Payer: BC Managed Care – PPO | Source: Ambulatory Visit | Attending: Radiation Oncology | Admitting: Radiation Oncology

## 2011-05-21 VITALS — BP 149/92 | HR 92 | Temp 98.3°F

## 2011-05-21 DIAGNOSIS — C20 Malignant neoplasm of rectum: Secondary | ICD-10-CM

## 2011-05-21 DIAGNOSIS — Z452 Encounter for adjustment and management of vascular access device: Secondary | ICD-10-CM

## 2011-05-21 LAB — CBC WITH DIFFERENTIAL/PLATELET
Basophils Absolute: 0 10*3/uL (ref 0.0–0.1)
EOS%: 3.7 % (ref 0.0–7.0)
Eosinophils Absolute: 0.1 10*3/uL (ref 0.0–0.5)
HGB: 13.3 g/dL (ref 13.0–17.1)
LYMPH%: 11 % — ABNORMAL LOW (ref 14.0–49.0)
MCH: 31.8 pg (ref 27.2–33.4)
MCV: 89.5 fL (ref 79.3–98.0)
MONO%: 9.9 % (ref 0.0–14.0)
NEUT#: 2.5 10*3/uL (ref 1.5–6.5)
Platelets: 197 10*3/uL (ref 140–400)
RBC: 4.19 10*6/uL — ABNORMAL LOW (ref 4.20–5.82)

## 2011-05-21 LAB — COMPREHENSIVE METABOLIC PANEL
AST: 28 U/L (ref 0–37)
Albumin: 4.4 g/dL (ref 3.5–5.2)
Alkaline Phosphatase: 67 U/L (ref 39–117)
BUN: 11 mg/dL (ref 6–23)
Potassium: 3.9 mEq/L (ref 3.5–5.3)
Total Bilirubin: 0.7 mg/dL (ref 0.3–1.2)

## 2011-05-21 NOTE — Patient Instructions (Signed)
1035-Pt discharged ambulatory with next appointment confirmed.  Pt aware to call with any questions or concerns.  

## 2011-05-21 NOTE — Progress Notes (Signed)
Per Dr Dalene Carrow and Sherilyn Banker NP 5-fu pump is to be discontinued today and labs drawn.  Pt made aware and verbalized understanding. 5-fu pump d/c and PAC flushed per protocol with lab work drawn.

## 2011-05-22 ENCOUNTER — Telehealth: Payer: Self-pay | Admitting: Nurse Practitioner

## 2011-05-22 ENCOUNTER — Other Ambulatory Visit: Payer: Self-pay | Admitting: Physician Assistant

## 2011-05-22 ENCOUNTER — Ambulatory Visit
Admission: RE | Admit: 2011-05-22 | Discharge: 2011-05-22 | Disposition: A | Discharge status: Home / Self Care | Payer: BC Managed Care – PPO | Source: Ambulatory Visit | Attending: Radiation Oncology | Admitting: Radiation Oncology

## 2011-05-22 DIAGNOSIS — C20 Malignant neoplasm of rectum: Secondary | ICD-10-CM

## 2011-05-22 NOTE — Telephone Encounter (Signed)
Spoke with Marchelle Folks at Dr. Valarie Merino office.  Dr. Epifania Gore will do surgery only 6-8weeks after radiation appt.  Scheduled appt. For pt. To see Dr. Epifania Gore on 12/19 @ 1300.  Pt to bring CT scans on disc with him to appt. Melina Schools, NP to give pt appointment during visit tomorrow.

## 2011-05-23 ENCOUNTER — Telehealth: Payer: Self-pay | Admitting: Hematology and Oncology

## 2011-05-23 ENCOUNTER — Ambulatory Visit
Admission: RE | Admit: 2011-05-23 | Discharge: 2011-05-23 | Disposition: A | Discharge status: Home / Self Care | Payer: BC Managed Care – PPO | Source: Ambulatory Visit | Attending: Radiation Oncology | Admitting: Radiation Oncology

## 2011-05-23 ENCOUNTER — Ambulatory Visit (HOSPITAL_BASED_OUTPATIENT_CLINIC_OR_DEPARTMENT_OTHER): Payer: BC Managed Care – PPO | Admitting: Physician Assistant

## 2011-05-23 ENCOUNTER — Encounter: Payer: Self-pay | Admitting: Radiation Oncology

## 2011-05-23 ENCOUNTER — Other Ambulatory Visit: Payer: BC Managed Care – PPO | Admitting: Lab

## 2011-05-23 VITALS — BP 127/66 | HR 85 | Temp 97.6°F | Ht 72.0 in | Wt 218.6 lb

## 2011-05-23 DIAGNOSIS — C20 Malignant neoplasm of rectum: Secondary | ICD-10-CM

## 2011-05-23 DIAGNOSIS — R5383 Other fatigue: Secondary | ICD-10-CM

## 2011-05-23 DIAGNOSIS — R109 Unspecified abdominal pain: Secondary | ICD-10-CM

## 2011-05-23 NOTE — Progress Notes (Signed)
Weekly Radiation Therapy Management  Current Dose: 50.4 Gy     Planned Dose:  50.4 Gy  Narrative . . . . . . . .The patient presents for routine under treatment assessment.   Port film x-rays were reviewed.  The chart was checked. He notes that the dysuria related to pelvic radiotherapy has increased over the past week. However, it is not unbearable. In that sense, patient is not interested in any peridium or other medical therapy at present. Physical Findings. . Weight essentially stable.  No significant changes. Impression . . . . . . . The patient is  tolerating radiation. Plan . . . . . . . . . .  finish treatment as planned. The patient was given an appointment to return to the radiation oncology clinic in one month. He is also continuing to follow with Dr. Thalia Party.   ---------------------------------- Artist Pais. Kathrynn Running, M.D. Radiation Oncology [phone]

## 2011-05-23 NOTE — Telephone Encounter (Signed)
S/w the pt regardiing his dec 2012 appts along with the ct scan appt

## 2011-05-23 NOTE — Telephone Encounter (Signed)
gv pt schedule for dec and ct for 12/11 @ 10 am. No new orders.

## 2011-05-23 NOTE — Progress Notes (Signed)
This office note has been dictated.

## 2011-05-23 NOTE — Progress Notes (Signed)
CC:   Anselmo Rod, MD, Yehuda Savannah, M.D. [name], M.D. Velora Heckler, MD [name], M.D. [name], MD  IDENTIFYING STATEMENT:  [name] is a 45 year old white male with rectal cancer who presents for followup.  INTERIM HISTORY:  Mr. Smethurst reports since his last clinic visit on May 14, 2011, that he completed his continuous infusion 5-FU on May 21, 2011.  He completes radiation therapy today.  He does report some issues with fatigue but is able to complete ADLs without difficulty.  As a matter of fact, he states that he is eager to return to work when possible.  He has had no fevers, chills, or night sweats. No dyspnea or cough.  He reports normal appetite and is not experiencing any nausea, vomiting, constipation, or diarrhea.  He does report some slight rectal tenderness which he states is related to his radiation therapy.  He also has some intermittent lower quadrant abdominal discomfort at the site of his ileostomy and continues to take p.r.n. oxycodone with relief of symptoms.  He has had no issues with swelling of extremities.  He does report some occasional dysuria as well as burning with urination that has been present since he has been on radiation therapy and has not changed over baseline.  He has not had any issues with hematuria. No pain, tenderness, or swelling at his Port-A- Cath site.  CURRENT MEDICATIONS ARE:  Reviewed and recorded.  PHYSICAL EXAMINATION:  Vital signs: Temperature is 97.6, heart rate 85, respirations 20, blood pressure 127/66, and weight 218 pounds.  In general, this is a well-developed, well-nourished white male in no acute distress: HEENT sclerae nonicteric.  There is no oral thrush or mucositis.  Skin: No rashes or lesions.  Lymph: No cervical, supraclavicular, axillary, or inguinal lymphadenopathy.  Cardiac: Regular rate and rhythm without murmurs or gallops.  Peripheral pulses are 2+ bilaterally.   Right subclavian Port-A-Cath without signs of infection.  Chest was clear to auscultation. Abdomen:  Positive bowel sounds, soft, with slight tenderness in the right lower quadrant where he also has a ileostomy with hernia noted.  Extremities:  No edema, cyanosis, or calf tenderness.  Neuro: Alert and oriented x3.  Strength, sensation, and coordination all grossly intact.  LABORATORY DATA:  A CBC with  diff from May 21, 2011; White blood cell count of 3.4, hemoglobin 13.3, hematocrit 37.5, platelets of 197, ANC 2.5, and MCV of 89.5.  IMPRESSION/PLAN: 1. 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  He is status post 6 cycles of     oxaliplatin-based therapy with 2 cycles of FOLFOX and 4 cycles of     Xelox and begin pelvic external radiation therapy with continuous     infusion 5-FU on April 16, 2011.  He completes his continuous     infusion 5-FU on May 21, 2011, and external radiation therapy     completed today. He reports some intermittent fatigue but is able     to complete ADLs without difficulty.  He also reports some     intermittent residual numbness and tingling in his fingers and in     his feet, but is able to complete ADLs. 2. Per Dr. Dalene Carrow, the patient will be scheduled for followup visit  in     61-month's time.  A few days before that we will reassess CBC with     diff, CMET, LDH, and CEA, as well as CT of the chest, abdomen, and     pelvis with contrast.  Labs and CT results will be reviewed at the     patient's followup visit.  The patient is also scheduled for     followup with Dr. [name] on June 25, 2011, at 1:00 pm, to     discuss surgical reversal of his ileostomy.  The patient is advised     that he will need to take the disk from his CT scans to his      followup with Dr. Epifania Gore. 3. The patient states that he will check with Human Resources at his     place of employment to see what steps he needs to take to possibly     return to work, and then he will bring any forms to be directed to the attention of Dr.     Dalene Carrow, as he is eager to return to work if possible.    ______________________________ Sherilyn Banker, MSN, ANP, BC RJ/MEDQ  D:  05/23/2011  T:  05/23/2011  Job:  [id number]

## 2011-05-23 NOTE — Progress Notes (Signed)
Pt reports intermittent diarrhea, takes Imodium prn. Pt has not changed diet, states diarrhea may be caused by foods he eats. Some mucusy rectal discharge which has increased in amt. Urinary freq has increased, w/dysuria which began end of last week.  Pt denies skin issues in tx area.  Still has hip discomfort, achyness. Pt feels it is due to less activity.  Completed tx today

## 2011-06-07 ENCOUNTER — Encounter: Payer: Self-pay | Admitting: *Deleted

## 2011-06-09 ENCOUNTER — Encounter: Payer: Self-pay | Admitting: Radiation Oncology

## 2011-06-09 NOTE — Progress Notes (Signed)
[facility] [address] [place], [place]  [address] Phone:  [phone] Fax [phone]   Radiation Oncology  Maryln Gottron, M.D. Billie Lade, PhD, M.D. Artist Pais Kathrynn Running, M.D. Radene Gunning, M.D., Ph.D. Lurline Hare, M.D. Lonie Peak, M.D.     NAME:  [name], [name] MRN:  [id number] DOB:  October 12, 2065   Date: 05/09/2011  Narrative: Mr. Barella was brought to radiation treatment unit today and set up for his final boost his isocenter and [facility] aperture were verified. Following review of the patient's set up, his treatment was approved as planned.   Electronically Signed Artist Pais. Kathrynn Running, MD MAM//Job #

## 2011-06-09 NOTE — Progress Notes (Signed)
[facility] Radiation Oncology  Name: [name] MRN: [id number]  Date: 06/09/2011  DOB: [birth year]  Status:outpatient   CC:    Arlan Organ, MD  Avel Peace, MD  [name], MD  [name], MD   REFERRING PHYSICIAN:  Vicente Serene I. Odogwu, MD   DIAGNOSIS:  This is a 45 year old gentleman with a stage T2N1(1/18)M0 adenocarcinoma of the rectum.  INDICATION FOR TREATMENT: Curative  TREATMENT DATES:  04/16/2011 through 05/23/2011  SITE/DOSE:  1.  the pelvis including the resection site and regional lymph nodes were treated to 45 gray in 25 fractions of 1.8 gray 2.  The anastomosis and primary tumor site were boosted excluding the small intestine to 50.4 gray with 3 additional fractions of 1.8 gray   BEAMS/ENERGY: 1.  a 4 field treatment technique was employed using anterior, posterior, right lateral and left lateral gantry positions. 6 megavolt photons were applied to the PA beam to provide shallow coverage of the presacral lymph nodes while 18 megavolt photons were applied each the other beams. The patient was treated in a prone position using a belly board to minimize small intestine exposure. 2.  a 3 field boost was performed with 18 megavolt photons applied to all 3 beams. A posterior field and 2 posterior oblique beams were used for the boost. 3-D conformal planning was employed to account for the dose distribution to the target as well as nearby organs at risk including the bladder hips and small intestine.  NARRATIVE:  The patient tolerated treatment relatively well. He experienced some episodes of watery diarrhea which were infrequent. He was required to stay away from his job during radiation in order to undergo infusional therapy. This allowed him to rest appropriately during the course of treatment and probably helped him tolerate radiation with better.  PLAN: Routine followup in one month. Patient instructed to call if questions or worsening complaints  in interim.  ------------------------------------------------  Artist Pais Kathrynn Running, M.D.

## 2011-06-18 ENCOUNTER — Other Ambulatory Visit: Payer: Self-pay | Admitting: Hematology and Oncology

## 2011-06-18 ENCOUNTER — Ambulatory Visit (HOSPITAL_COMMUNITY)
Admission: RE | Admit: 2011-06-18 | Discharge: 2011-06-18 | Disposition: A | Discharge status: Home / Self Care | Payer: BC Managed Care – PPO | Source: Ambulatory Visit | Attending: Physician Assistant | Admitting: Physician Assistant

## 2011-06-18 ENCOUNTER — Other Ambulatory Visit (HOSPITAL_BASED_OUTPATIENT_CLINIC_OR_DEPARTMENT_OTHER): Payer: BC Managed Care – PPO | Admitting: Lab

## 2011-06-18 DIAGNOSIS — K7689 Other specified diseases of liver: Secondary | ICD-10-CM | POA: Insufficient documentation

## 2011-06-18 DIAGNOSIS — Z923 Personal history of irradiation: Secondary | ICD-10-CM | POA: Insufficient documentation

## 2011-06-18 DIAGNOSIS — C20 Malignant neoplasm of rectum: Secondary | ICD-10-CM

## 2011-06-18 DIAGNOSIS — Z9221 Personal history of antineoplastic chemotherapy: Secondary | ICD-10-CM | POA: Insufficient documentation

## 2011-06-18 LAB — CBC WITH DIFFERENTIAL/PLATELET
Basophils Absolute: 0 10*3/uL (ref 0.0–0.1)
Eosinophils Absolute: 0.1 10*3/uL (ref 0.0–0.5)
HCT: 41.7 % (ref 38.4–49.9)
HGB: 14.6 g/dL (ref 13.0–17.1)
LYMPH%: 20.9 % (ref 14.0–49.0)
MONO#: 0.3 10*3/uL (ref 0.1–0.9)
NEUT#: 1.9 10*3/uL (ref 1.5–6.5)
NEUT%: 64.2 % (ref 39.0–75.0)
Platelets: 175 10*3/uL (ref 140–400)
WBC: 3 10*3/uL — ABNORMAL LOW (ref 4.0–10.3)
lymph#: 0.6 10*3/uL — ABNORMAL LOW (ref 0.9–3.3)

## 2011-06-18 LAB — CMP (CANCER CENTER ONLY)
Alkaline Phosphatase: 61 U/L (ref 26–84)
BUN, Bld: 10 mg/dL (ref 7–22)
Creat: 1.1 mg/dl (ref 0.6–1.2)
Glucose, Bld: 115 mg/dL (ref 73–118)
Sodium: 144 mEq/L (ref 128–145)
Total Bilirubin: 1 mg/dl (ref 0.20–1.60)

## 2011-06-18 MED ORDER — IOHEXOL 300 MG/ML  SOLN
100.0000 mL | Freq: Once | INTRAMUSCULAR | Status: AC | PRN
  Administered 2011-06-18: 100 mL via INTRAVENOUS

## 2011-06-20 ENCOUNTER — Ambulatory Visit
Admission: RE | Admit: 2011-06-20 | Discharge: 2011-06-20 | Disposition: A | Discharge status: Home / Self Care | Payer: BC Managed Care – PPO | Source: Ambulatory Visit | Attending: Radiation Oncology | Admitting: Radiation Oncology

## 2011-06-20 ENCOUNTER — Ambulatory Visit (HOSPITAL_BASED_OUTPATIENT_CLINIC_OR_DEPARTMENT_OTHER): Payer: BC Managed Care – PPO | Admitting: Hematology and Oncology

## 2011-06-20 VITALS — Wt 216.0 lb

## 2011-06-20 VITALS — BP 135/83 | HR 67 | Temp 97.1°F | Ht 72.0 in | Wt 216.2 lb

## 2011-06-20 DIAGNOSIS — IMO0002 Reserved for concepts with insufficient information to code with codable children: Secondary | ICD-10-CM

## 2011-06-20 DIAGNOSIS — C20 Malignant neoplasm of rectum: Secondary | ICD-10-CM

## 2011-06-20 NOTE — Progress Notes (Signed)
This office note has been dictated.

## 2011-06-20 NOTE — Progress Notes (Signed)
Radiation Oncology         [phone] ________________________________  Name: [name] MRN: [id number]  Date: 06/20/2011  DOB: 03-14-1966       Follow-Up Visit Note  CC: Delorse Lek, MD  Laurice Record., MD  Interval Since Last Radiation:  1 months  Narrative . . . . . . . . The patient returns today for routine follow-up.  He reports his bladder irritation has improved over the past month. He'll see his surgeon for 4 takedown of his colostomy next week.                              ALLERGIES:   has no known allergies.  Meds. . . . . . . . . . . . Current Outpatient Prescriptions  Medication Sig Dispense Refill  . ALPRAZolam (XANAX) 1 MG tablet Take 1 mg by mouth at bedtime as needed.        . lidocaine-prilocaine (EMLA) cream Apply 1 application topically as needed.        . ondansetron (ZOFRAN) 8 MG tablet Take 8 mg by mouth 2 (two) times daily.        Marland Kitchen oxyCODONE (OXY IR/ROXICODONE) 5 MG immediate release tablet Take 5 mg by mouth every 4 (four) hours as needed.        . prochlorperazine (COMPAZINE) 10 MG tablet Take 10 mg by mouth every 6 (six) hours as needed.          Physical Findings. . .  weight is 216 lb (97.977 kg). .  No significant changes.  Lab Findings . . . . .  Lab Results  Component Value Date   WBC 3.0* 06/18/2011   HGB 14.6 06/18/2011   HCT 41.7 06/18/2011   MCV 89.6 06/18/2011   PLT 175 06/18/2011    @LASTCHEM @   X-Ray Findings. . . . Ct Chest W Contrast  06/18/2011  *RADIOLOGY REPORT*  Clinical Data:  Follow-up rectal carcinoma.  Status post surgery, radiation therapy, and chemotherapy.  CT CHEST, ABDOMEN AND PELVIS WITH CONTRAST  Technique:  Multidetector CT imaging of the chest, abdomen and pelvis was performed following the standard protocol during bolus administration of intravenous contrast.  Contrast: OMNIPAQUE IOHEXOL 300 MG/ML IV SOLN  Comparison:  03/27/2011  CT CHEST  Findings:  No evidence of mediastinal or hilar  masses.  No adenopathy seen elsewhere within the thorax.  No evidence of pleural or pericardial effusion.  No evidence of chest wall mass.  Both lungs are clear.  No suspicious pulmonary nodules or masses are identified.  No evidence of endobronchial lesion.  No suspicious bone lesions identified.  IMPRESSION: Negative.  No evidence of thoracic metastatic disease or other acute findings.  CT ABDOMEN AND PELVIS  Findings:  Diffuse hepatic steatosis is stable.  No liver masses are identified.  The pancreas, spleen, adrenal glands, and kidneys are normal in appearance.  No soft tissue masses or lymphadenopathy identified within the abdomen or pelvis. Shotty retroperitoneal and iliac lymph nodes remain stable.  There is no evidence of inflammatory process or abnormal fluid collections.  No evidence of bowel wall thickening or dilatation. 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  No evidence of bowel obstruction or ischemia.  No suspicious bone lesions are identified.  IMPRESSION:  1.  No evidence of recurrent or metastatic carcinoma. 2.  Stable hepatic steatosis.  Original Report Authenticated By: Danae Orleans, M.D.   Ct Abdomen Pelvis W Contrast  06/18/2011  *RADIOLOGY REPORT*  Clinical Data:  Follow-up rectal carcinoma.  Status post surgery, radiation therapy, and chemotherapy.  CT CHEST, ABDOMEN AND PELVIS WITH CONTRAST  Technique:  Multidetector CT imaging of the chest, abdomen and pelvis was performed following the standard protocol during bolus administration of intravenous contrast.  Contrast: OMNIPAQUE IOHEXOL 300 MG/ML IV SOLN  Comparison:  03/27/2011  CT CHEST  Findings:  No evidence of mediastinal or hilar masses.  No adenopathy seen elsewhere within the thorax.  No evidence of pleural or pericardial effusion.  No evidence of chest wall mass.  Both lungs are clear.  No suspicious pulmonary nodules or masses are identified.  No evidence of  endobronchial lesion.  No suspicious bone lesions identified.  IMPRESSION: Negative.  No evidence of thoracic metastatic disease or other acute findings.  CT ABDOMEN AND PELVIS  Findings:  Diffuse hepatic steatosis is stable.  No liver masses are identified.  The pancreas, spleen, adrenal glands, and kidneys are normal in appearance.  No soft tissue masses or lymphadenopathy identified within the abdomen or pelvis. Shotty retroperitoneal and iliac lymph nodes remain stable.  There is no evidence of inflammatory process or abnormal fluid collections.  No evidence of bowel wall thickening or dilatation. 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  No evidence of bowel obstruction or ischemia.  No suspicious bone lesions are identified.  IMPRESSION:  1.  No evidence of recurrent or metastatic carcinoma. 2.  Stable hepatic steatosis.  Original Report Authenticated By: Danae Orleans, M.D.    Impression . . . . . . . The patient is recovering from the effects of radiation.  Recent staging studies show no evidence of metastatic disease  Plan . . . . . . . . . . . Marland Kitchen the patient will followup in the radiation oncology clinic in the future on an as needed basis. I look forward to following his progress through medical oncology notes.  _____________________________________  Artist Pais Kathrynn Running, M.D.

## 2011-06-20 NOTE — Progress Notes (Signed)
CC:   [name], M.D. Adolph Pollack, M.D. [name], M.D. [name], MD  IDENTIFYING STATEMENT:  Patient is a 45 year old man with rectal cancer who presents for followup.  INTERIM HISTORY:  Mr. Malveaux completed continuous infusion 5-FU with radiation therapy on May 21, 2011.  He has no current concerns.  He is back at work.  He denies pain.  His weight is stable.  He has not noted any bleeding.  He continues to have some pain at his ostomy site.  MEDICATIONS:  Reviewed and updated.  PAST MEDICAL HISTORY/FAMILY HISTORY/ SOCIAL HISTORY:  Unchanged.  REVIEW OF SYSTEMS:  A 10-point review of systems negative.  PHYSICAL EXAMINATION:  The patient is a well-appearing, well-nourished man in no distress.  Vitals:  Pulse  67, blood pressure 135/83, temperature 97.1, respirations 18, weight 216.2 pounds.  HEENT:  Head is atraumatic, normocephalic.  Sclerae anicteric.  Mouth moist.  Neck: Supple.  Chest:  Clear.  CVS:  First and second heart sounds present. No added sounds or murmurs.  Abdomen:  Soft, nontender.  Bowel sounds present.  Colostomy evident with semi-formed stools.  Extremities: No calf tenderness.  Pulses present and symmetrical.  CNS:  Nonfocal. Lymph nodes: No adenopathy.  LABORATORY DATA:  06/18/2011: White cell count 3, hemoglobin 14.6, hematocrit 41.7, platelets 175.  Sodium 144, potassium 4.8, chloride 107, CO2 28, BUN 10 creatinine 1.1, glucose 105, total bilirubin 1, alkaline phosphatase 61, AST 46, ALT 52, calcium 8.8.  CT of the chest, abdomen and pelvis on 06/18/2011 was negative for thoracic metastatic disease.  The liver showed diffuse hepatic steatosis but stable.  There are no liver masses.  The pancreas, spleen, adrenal glands, and kidneys were normal in appearance.  Shotty retroperitoneal and iliac lymph nodes remain stable.  There is no evidence of inflammatory process or abnormal fluid collection.  There was a large right lower  quadrant parastomal hernia containing numerous bowel loops with no evidence of obstruction or ischemia.  No suspicious bone lesions were identified.  IMPRESSION AND PLAN:  Mr. 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  He is status post 6 cycles of oxaliplatin based therapy with 2 cycles of FOLFOX and 4 cycles of XELOX.  He began external radiation therapy with continues infusion 5-FU April 16, 2011 through May 21, 2011.  Patient's current exam, lab and radiographical studies indicated no evidence of progression of disease.  With this said, the patient will return to Dr. Epifania Gore for reversal and hernia repair.  He follows up with me in the new year.    ______________________________ Laurice Record, M.D. LIO/MEDQ  D:  06/20/2011  T:  06/20/2011  Job:  [id number]

## 2011-06-20 NOTE — Progress Notes (Signed)
Saw Dr Dalene Carrow this morning, VS taken at that time.  Pt cont to work. Has some discomfort at ostomy site w/movement; Oxy-IR prn Appt w/Dr Bess Harvest next week.

## 2011-06-27 ENCOUNTER — Telehealth: Payer: Self-pay | Admitting: *Deleted

## 2011-06-27 NOTE — Telephone Encounter (Signed)
Received call from pt wanting to let md know re:   Dr. Epifania Gore will perform surgery  For colostomy reversal, hernia repair on pt  On 06/28/11.    Asked pt to keep Dr. Dalene Carrow updated on his progress.   Pt voiced understanding.

## 2011-07-06 ENCOUNTER — Emergency Department (HOSPITAL_COMMUNITY)
Admission: EM | Admit: 2011-07-06 | Discharge: 2011-07-06 | Disposition: A | Discharge status: Home / Self Care | Payer: BC Managed Care – PPO | Attending: Emergency Medicine | Admitting: Emergency Medicine

## 2011-07-06 ENCOUNTER — Encounter (HOSPITAL_COMMUNITY): Payer: Self-pay | Admitting: *Deleted

## 2011-07-06 DIAGNOSIS — Y838 Other surgical procedures as the cause of abnormal reaction of the patient, or of later complication, without mention of misadventure at the time of the procedure: Secondary | ICD-10-CM | POA: Insufficient documentation

## 2011-07-06 DIAGNOSIS — T8149XA Infection following a procedure, other surgical site, initial encounter: Secondary | ICD-10-CM

## 2011-07-06 DIAGNOSIS — L02219 Cutaneous abscess of trunk, unspecified: Secondary | ICD-10-CM | POA: Insufficient documentation

## 2011-07-06 DIAGNOSIS — T8131XA Disruption of external operation (surgical) wound, not elsewhere classified, initial encounter: Secondary | ICD-10-CM

## 2011-07-06 DIAGNOSIS — E785 Hyperlipidemia, unspecified: Secondary | ICD-10-CM | POA: Insufficient documentation

## 2011-07-06 DIAGNOSIS — T8140XA Infection following a procedure, unspecified, initial encounter: Secondary | ICD-10-CM | POA: Insufficient documentation

## 2011-07-06 HISTORY — DX: Malignant neoplasm of rectosigmoid junction: C19

## 2011-07-06 MED ORDER — CLINDAMYCIN HCL 150 MG PO CAPS: ORAL_CAPSULE | ORAL | Status: DC

## 2011-07-06 NOTE — ED Provider Notes (Signed)
History     CSN: [id number]  Arrival date & time 07/06/11  [id number]   First MD Initiated Contact with Patient 07/06/11 [phone]      Chief Complaint  Patient presents with  . Post-op Problem    (Consider location/radiation/quality/duration/timing/severity/associated sxs/prior treatment) HPI Comments: Patient had an Ileostomy reversal performed by Hartford Poli at [facility] on 06/28/11.  He reports that there were not any complications during the surgery or any issues post op until this morning.  This morning he noticed bright red blood coming from the incision site.  He also noticed that the area around the incision was erythematous.  He denies any fevers.  He denies doing anything strenuous.  He did not notice the incision breaking open.  Denies nausea or vomiting.  He reports no decrease in urinary output.  He is having normal bowel movements.  The history is provided by the patient.    Past Medical History  Diagnosis Date  . Gout   . Hyperlipemia   . Hemorrhoid   . Cancer     rectum  . Colorectal cancer     Past Surgical History  Procedure Date  . Proctectomy 08/16/2010    coloanal anastomosis and ileostomy  . Inguinal hernia repair   . Ileostomy     reversal 06/28/11    Family History  Problem Relation Age of Onset  . Cancer Maternal Grandfather     throat  . Cancer Cousin     breast, maternal  . Cancer Maternal Grandmother     unknown primary    History  Substance Use Topics  . Smoking status: Former Smoker -- 1.0 packs/day for 18 years  . Smokeless tobacco: Not on file  . Alcohol Use: Yes     occasionally      Review of Systems  Constitutional: Negative for fever and chills.  Respiratory: Negative for shortness of breath.   Cardiovascular: Negative for chest pain.  Gastrointestinal: Negative for nausea, vomiting, abdominal pain, constipation and blood in stool.  Genitourinary: Negative for hematuria, decreased urine volume and difficulty urinating.    Neurological: Negative for dizziness and syncope.    Allergies  Penicillins  Home Medications   Current Outpatient Rx  Name Route Sig Dispense Refill  . OXYCODONE HCL 5 MG PO TABS Oral Take 5 mg by mouth every 4 (four) hours as needed. For pain      BP 153/76  Pulse 98  Temp(Src) 99 F (37.2 C) (Oral)  Resp 20  Ht 5\' 11"  (1.803 m)  Wt 209 lb (94.802 kg)  BMI 29.15 kg/m2  SpO2 96%  Physical Exam  Nursing note and vitals reviewed. Constitutional: He is oriented to person, place, and time. He appears well-developed and well-nourished.  HENT:  Head: Normocephalic and atraumatic.  Neck: Normal range of motion. Neck supple.  Cardiovascular: Normal rate, regular rhythm and normal heart sounds.   Pulmonary/Chest: Effort normal and breath sounds normal.  Abdominal: Soft. Bowel sounds are normal.    Neurological: He is alert and oriented to person, place, and time.  Skin: Skin is warm and dry.  Psychiatric: He has a normal mood and affect.    ED Course  Procedures (including critical care time)  Labs Reviewed - No data to display No results found.   No diagnosis found.  10:00 AM Discussed patient with Dr. Karie Mainland who is on call for Surgery at [facility].  She stated that it is not uncommon for the incision site to break open  a little bit.  She recommends applying silver nitrate to the area that is bleeding and putting the patient on Clindamycin for cellulitis.  They will follow up with the patient in one week.  Silver Nitrate applied to the area and bleeding stopped.  Area of cellulitis marked with a skin marking pen.  Patient given Rx for Clindamycin and discharged home.  MDM  Small area of the incision broken open.  Discussed with Dr. Arlyn Leak who reports that this is not uncommon treated with silver nitrate.  Patient placed on antibiotics for cellulitis.  Patient afebrile.  Skin area marked and patient instructed to follow up if area of cellulitis does not  improve.        Pascal Lux [facility] 07/06/11 [phone]

## 2011-07-06 NOTE — ED Notes (Signed)
Pt states ileostomy reversal performed by Hartford Poli, MD at [facility] on 06/28/11.  C/o awoke this am with site bleeding.

## 2011-07-07 NOTE — ED Provider Notes (Signed)
Medical screening examination/treatment/procedure(s) were performed by non-physician practitioner and as supervising physician I was immediately available for consultation/collaboration.  Ethelda Chick, MD 07/07/11 [phone]

## 2011-07-19 ENCOUNTER — Telehealth: Payer: Self-pay | Admitting: *Deleted

## 2011-07-19 NOTE — Telephone Encounter (Signed)
Spoke with pt and was informed that pt has f/u visit with Dr. Epifania Gore @ [facility] on  08/13/11.   Pt also stated he has received a reminder from GI office for a repeat colonoscopy.   Instructed pt to let nurse know of Dr. Valarie Merino plan after his visit in Feb.  Pt voiced understanding.

## 2011-07-20 ENCOUNTER — Emergency Department (INDEPENDENT_AMBULATORY_CARE_PROVIDER_SITE_OTHER): Payer: BC Managed Care – PPO

## 2011-07-20 ENCOUNTER — Emergency Department (HOSPITAL_BASED_OUTPATIENT_CLINIC_OR_DEPARTMENT_OTHER)
Admission: EM | Admit: 2011-07-20 | Discharge: 2011-07-21 | Disposition: A | Discharge status: Other Acute Inpatient Hospital | Payer: BC Managed Care – PPO | Attending: Emergency Medicine | Admitting: Emergency Medicine

## 2011-07-20 ENCOUNTER — Encounter (HOSPITAL_BASED_OUTPATIENT_CLINIC_OR_DEPARTMENT_OTHER): Payer: Self-pay | Admitting: *Deleted

## 2011-07-20 DIAGNOSIS — R509 Fever, unspecified: Secondary | ICD-10-CM | POA: Insufficient documentation

## 2011-07-20 DIAGNOSIS — R188 Other ascites: Secondary | ICD-10-CM

## 2011-07-20 DIAGNOSIS — G8918 Other acute postprocedural pain: Secondary | ICD-10-CM

## 2011-07-20 DIAGNOSIS — Z9889 Other specified postprocedural states: Secondary | ICD-10-CM

## 2011-07-20 DIAGNOSIS — E785 Hyperlipidemia, unspecified: Secondary | ICD-10-CM | POA: Insufficient documentation

## 2011-07-20 DIAGNOSIS — R1031 Right lower quadrant pain: Secondary | ICD-10-CM | POA: Insufficient documentation

## 2011-07-20 DIAGNOSIS — R110 Nausea: Secondary | ICD-10-CM | POA: Insufficient documentation

## 2011-07-20 LAB — COMPREHENSIVE METABOLIC PANEL
ALT: 25 U/L (ref 0–53)
Alkaline Phosphatase: 70 U/L (ref 39–117)
BUN: 9 mg/dL (ref 6–23)
CO2: 24 mEq/L (ref 19–32)
Chloride: 100 mEq/L (ref 96–112)
GFR calc Af Amer: >90 mL/min (ref >90)
Glucose, Bld: 103 mg/dL — ABNORMAL HIGH (ref 70–99)
Potassium: 3.9 mEq/L (ref 3.5–5.1)
Sodium: 138 mEq/L (ref 135–145)
Total Bilirubin: 1.2 mg/dL (ref 0.3–1.2)

## 2011-07-20 LAB — DIFFERENTIAL
Basophils Absolute: 0 10*3/uL (ref 0.0–0.1)
Basophils Relative: 0 % (ref 0–1)
Eosinophils Relative: 0 % (ref 0–5)
Lymphocytes Relative: 10 % — ABNORMAL LOW (ref 12–46)
Monocytes Absolute: 0.9 10*3/uL (ref 0.1–1.0)
Neutro Abs: 7.8 10*3/uL — ABNORMAL HIGH (ref 1.7–7.7)

## 2011-07-20 LAB — URINALYSIS, ROUTINE W REFLEX MICROSCOPIC
Bilirubin Urine: NEGATIVE
Ketones, ur: NEGATIVE mg/dL
Leukocytes, UA: NEGATIVE
Nitrite: NEGATIVE
Urobilinogen, UA: 0.2 mg/dL (ref 0.0–1.0)
pH: 6 (ref 5.0–8.0)

## 2011-07-20 LAB — CBC
MCHC: 34 g/dL (ref 30.0–36.0)
MCV: 84.7 fL (ref 78.0–100.0)
Platelets: 199 10*3/uL (ref 150–400)
RDW: 13.7 % (ref 11.5–15.5)
WBC: 9.7 10*3/uL (ref 4.0–10.5)

## 2011-07-20 MED ORDER — CIPROFLOXACIN IN D5W 400 MG/200ML IV SOLN
400.0000 mg | Freq: Once | INTRAVENOUS | Status: AC
  Administered 2011-07-21: 400 mg via INTRAVENOUS
  Filled 2011-07-20: qty 200

## 2011-07-20 MED ORDER — SODIUM CHLORIDE 0.9 % IV SOLN
INTRAVENOUS | Status: DC
  Administered 2011-07-20 (×2): via INTRAVENOUS

## 2011-07-20 MED ORDER — IOHEXOL 300 MG/ML  SOLN
100.0000 mL | Freq: Once | INTRAMUSCULAR | Status: AC | PRN
  Administered 2011-07-20: 100 mL via INTRAVENOUS

## 2011-07-20 MED ORDER — METRONIDAZOLE IN NACL 5-0.79 MG/ML-% IV SOLN
500.0000 mg | Freq: Once | INTRAVENOUS | Status: AC
  Administered 2011-07-20: 500 mg via INTRAVENOUS
  Filled 2011-07-20: qty 100

## 2011-07-20 MED ORDER — ACETAMINOPHEN 500 MG PO TABS
1000.0000 mg | ORAL_TABLET | Freq: Once | ORAL | Status: AC
  Administered 2011-07-20: 1000 mg via ORAL
  Filled 2011-07-20: qty 2

## 2011-07-20 MED ORDER — IOHEXOL 300 MG/ML  SOLN
20.0000 mL | INTRAMUSCULAR | Status: AC
  Administered 2011-07-20: 20 mL via ORAL

## 2011-07-20 MED ORDER — MORPHINE SULFATE 4 MG/ML IJ SOLN
4.0000 mg | Freq: Once | INTRAMUSCULAR | Status: AC
  Administered 2011-07-20: 4 mg via INTRAVENOUS

## 2011-07-20 MED ORDER — MORPHINE SULFATE 4 MG/ML IJ SOLN
INTRAMUSCULAR | Status: AC
Start: 2011-07-20 — End: 2011-07-20
  Administered 2011-07-20: 4 mg via INTRAVENOUS
  Filled 2011-07-20: qty 1

## 2011-07-20 MED ORDER — MORPHINE SULFATE 4 MG/ML IJ SOLN
4.0000 mg | Freq: Once | INTRAMUSCULAR | Status: AC
  Administered 2011-07-20: 4 mg via INTRAVENOUS
  Filled 2011-07-20: qty 1

## 2011-07-20 NOTE — ED Notes (Signed)
I placed a call to [facility] hospital for Dr. Epifania Gore per Dr. Ethelda Chick. Dr. Vear Clock returned the call @ [phone]. I placed a call as well to the transfer center and the ED physician, the call was returned by Dr. Ander Purpura @ [phone].

## 2011-07-20 NOTE — ED Notes (Signed)
Pt had surgery (ileostomy reversal) on the 21st. Home on the 25th. Seen at [facility] and dx'd with cellulitis on the 29th. Placed on Clindamycin. Finished 1 day ago. Temp 102 at home.

## 2011-07-20 NOTE — ED Provider Notes (Addendum)
History  This chart was scribed for Eric Sou, MD by Bennett Scrape. This patient was seen in room MH11/MH11 and the patient's care was started at 5:20PM.  CSN: [id number]  Arrival date & time 07/20/11  1556   First MD Initiated Contact with Patient 07/20/11 1711      Chief Complaint  Patient presents with  . Abdominal Pain     Patient is a 46 y.o. male presenting with abdominal pain. The history is provided by the patient. No language interpreter was used.  Abdominal Pain The primary symptoms of the illness include abdominal pain, fever and nausea. The primary symptoms of the illness do not include fatigue, shortness of breath, vomiting, diarrhea, hematochezia or dysuria. The current episode started more than 2 days ago. The onset of the illness was gradual. The problem has been gradually worsening.  The abdominal pain began more than 2 days ago. The pain came on gradually. The abdominal pain has been gradually worsening since its onset. The abdominal pain is located in the RLQ. The abdominal pain does not radiate. The severity of the abdominal pain is 7/10. The abdominal pain is relieved by nothing. The abdominal pain is exacerbated by movement.  Symptoms associated with the illness do not include chills, hematuria or back pain.   [name] is a 45 y.o. male who presents to the Emergency Department complaining of gradual onset, gradually worsening, constant, non-radaiting abdominal pain located in the RLQ with associated fever and nausea. Fever was measured at 102 at home. Fever was measured at 99.5 in the ED. Pt had ileostomy reversal due to rectal cancer on the 21st at [facility]. He was discharged home on the 25th. He was then seen at [facility] and dx'd with cellulitis around the incision on the 29th. Pt states that he was placed on Clindamycin which he finished 1 day ago. Pt reports an increase in abdominal pain since yesterday. He reports taking Oxycodone with mild improvement in  symptoms. Pt states that his main concern is another infection. He denies SOB and dysuria as associated symptoms. He also c/o 2 to 3 days of a non-productive cough. He denies smoking and drug use, but is an occasional alcohol user.   Past Medical History  Diagnosis Date  . Gout   . Hyperlipemia   . Hemorrhoid   . Cancer     rectum  . Colorectal cancer     Past Surgical History  Procedure Date  . Proctectomy 08/16/2010    coloanal anastomosis and ileostomy  . Inguinal hernia repair   . Ileostomy     reversal 06/28/11  . Ileostomy reversal   . Cholecystectomy     Family History  Problem Relation Age of Onset  . Cancer Maternal Grandfather     throat  . Cancer Cousin     breast, maternal  . Cancer Maternal Grandmother     unknown primary    History  Substance Use Topics  . Smoking status: Former Smoker -- 1.0 packs/day for 18 years  . Smokeless tobacco: Not on file  . Alcohol Use: Yes     occasionally      Review of Systems  Constitutional: Positive for fever. Negative for chills and fatigue.  HENT: Negative for sore throat and rhinorrhea.   Respiratory: Positive for cough. Negative for shortness of breath.   Gastrointestinal: Positive for nausea and abdominal pain. Negative for vomiting, diarrhea and hematochezia.  Genitourinary: Negative for dysuria and hematuria.  Musculoskeletal: Negative for back  pain.  Skin: Negative for rash.  All other systems reviewed and are negative.    Allergies  Penicillins  Home Medications   Current Outpatient Rx  Name Route Sig Dispense Refill  . CLINDAMYCIN HCL 150 MG PO CAPS Oral Take 450 mg by mouth 3 (three) times daily. For 10 days    . IBUPROFEN 200 MG PO TABS Oral Take 400 mg by mouth every 6 (six) hours as needed. For fever    . OXYCODONE HCL 5 MG PO TABS Oral Take 5 mg by mouth every 4 (four) hours as needed. For pain      Triage Vitals: BP 113/68  Pulse 94  Temp(Src) 99.5 F (37.5 C) (Oral)  Resp 20  Ht 5'  11" (1.803 m)  Wt 200 lb (90.719 kg)  BMI 27.89 kg/m2  SpO2 99%  Physical Exam  Nursing note and vitals reviewed. Constitutional: He is oriented to person, place, and time. He appears well-developed and well-nourished.  HENT:  Head: Normocephalic and atraumatic.  Mouth/Throat: Oropharynx is clear and moist.  Eyes: Conjunctivae and EOM are normal.  Neck: Normal range of motion. Neck supple.  Cardiovascular: Normal rate, regular rhythm and normal heart sounds.   No murmur heard. Pulmonary/Chest: Effort normal and breath sounds normal. No respiratory distress.  Abdominal: Soft. There is tenderness (in the lower abdomen, most tender over the incision).  Musculoskeletal: Normal range of motion. He exhibits no edema.  Neurological: He is alert and oriented to person, place, and time. No cranial nerve deficit.  Skin: Skin is warm and dry. No rash noted.       Incision in the lower quadrant, it is well healing  Psychiatric: He has a normal mood and affect. His behavior is normal.    ED Course  Procedures (including critical care time) 6:25 PM pain improved her treatment with intravenous morphine. requesting more pain medicine. Additional morphine ordered. Patient appears comfortable awake alert 7:20 PM she feels much improved and is resting comfortably declines further pain medicine DIAGNOSTIC STUDIES: Oxygen Saturation is 99% on room air, normal by my interpretation.    COORDINATION OF CARE: 5:31PM-Discussed treatment plan with pt at bedside and pt agreed to plan.   Labs Reviewed  COMPREHENSIVE METABOLIC PANEL - Abnormal; Notable for the following:    Glucose, Bld 103 (*)    All other components within normal limits  DIFFERENTIAL - Abnormal; Notable for the following:    Neutrophils Relative 80 (*)    Neutro Abs 7.8 (*)    Lymphocytes Relative 10 (*)    All other components within normal limits  URINALYSIS, ROUTINE W REFLEX MICROSCOPIC - Abnormal; Notable for the following:     Color, Urine AMBER (*) BIOCHEMICALS MAY BE AFFECTED BY COLOR   All other components within normal limits  CBC   Ct Abdomen Pelvis W Contrast  07/20/2011  *RADIOLOGY REPORT*  Clinical Data: Fever; postoperative, status post ileostomy reversal on 06/28/2011.  Assess for intra-abdominal abscess.  History of colorectal cancer, status post proctectomy, chemotherapy and radiation therapy.  CT ABDOMEN AND PELVIS WITH CONTRAST  Technique:  Multidetector CT imaging of the abdomen and pelvis was performed following the standard protocol during bolus administration of intravenous contrast.  Contrast: OMNIPAQUE IOHEXOL 300 MG/ML IV SOLN  Comparison: CT of the chest, abdomen and pelvis performed 06/18/2011  Findings: Minimal bibasilar atelectasis is noted.  The liver and spleen are unremarkable in appearance.  The patient is status post cholecystectomy, with clips noted at the gallbladder  fossa.  The pancreas and adrenal glands are unremarkable.  There is a 2 mm stone noted near the upper pole of the right kidney.  The kidneys are otherwise unremarkable in appearance. There is no evidence of hydronephrosis.  No obstructing ureteral stones are seen.  Mild nonspecific perinephric stranding is again noted bilaterally.  At the site of the patient's ileostomy reversal at the right lower quadrant, there is a large soft tissue phlegmon and focal ileal wall thickening.  Phlegmon and fluid tracks superiorly along the mesentery, without definite intra-abdominal abscess.  There is no evidence of obstruction; contrast progresses through the region of inflammation.  There is also mild inflammation of adjacent small bowel loops.  There is mild peripheral enhancement about a small collection of fluid along the anterior abdominal wall of the right lower quadrant, measuring 4.9 x 2.0 x 1.7 cm.  This raises concern for a focal soft tissue abscess, though a postoperative seroma could have a similar appearance.  Surrounding soft tissue  inflammation is seen.  The more proximal small bowel is unremarkable in appearance.  The stomach is within normal limits.  No acute vascular abnormalities are seen.  Scattered calcification is noted along the distal abdominal aorta and its branches.  The appendix filled with air and is grossly unremarkable in appearance; minimal associated soft tissue stranding appears to be reactive secondary to the right lower quadrant process.  Mild soft tissue stranding about the sigmoid colon and rectum likely reflects prior radiation therapy.  The colon is otherwise unremarkable in appearance.  The bladder is mildly distended and grossly unremarkable appearance.  The prostate remains normal in size.  No inguinal lymphadenopathy is seen.  No acute osseous abnormalities are identified.  IMPRESSION:  1.  Mild peripheral enhancement about a small collection of fluid at the anterior abdominal wall along the right lower quadrant, measuring 4.9 x 2.0 x 1.7 cm at the site of ileostomy reversal. This raises concern for a small focal soft tissue abscess, though a postoperative seroma could have a similar appearance. 2.  Large soft tissue phlegmon and focal ileal wall thickening at the site of ileostomy reversal, with phlegmon and fluid tracking superiorly along the mesentery, but no evidence of intra-abdominal abscess.  No evidence of obstruction; contrast passes across the region of inflammation. 3.  Mild inflammation of the adjacent small bowel loops noted. 4.  Scattered calcification along the distal abdominal aorta and its branches. 5.  Mild soft tissue inflammation about the sigmoid colon and rectum likely reflects prior radiation therapy. 6.  2 mm nonobstructing stone noted near the upper pole of the right kidney.  Original Report Authenticated By: Tonia Ghent, M.D.     No diagnosis found. Results for orders placed during the hospital encounter of 07/20/11  COMPREHENSIVE METABOLIC PANEL      Component Value Range    Sodium 138  135 - 145 (mEq/L)   Potassium 3.9  3.5 - 5.1 (mEq/L)   Chloride 100  96 - 112 (mEq/L)   CO2 24  19 - 32 (mEq/L)   Glucose, Bld 103 (*) 70 - 99 (mg/dL)   BUN 9  6 - 23 (mg/dL)   Creatinine, Ser 4.09  0.50 - 1.35 (mg/dL)   Calcium 9.8  8.4 - 81.1 (mg/dL)   Total Protein 7.9  6.0 - 8.3 (g/dL)   Albumin 4.3  3.5 - 5.2 (g/dL)   AST 20  0 - 37 (U/L)   ALT 25  0 - 53 (U/L)  Alkaline Phosphatase 70  39 - 117 (U/L)   Total Bilirubin 1.2  0.3 - 1.2 (mg/dL)   GFR calc non Af Amer >90  >90 (mL/min)   GFR calc Af Amer >90  >90 (mL/min)  CBC      Component Value Range   WBC 9.7  4.0 - 10.5 (K/uL)   RBC 4.90  4.22 - 5.81 (MIL/uL)   Hemoglobin 14.1  13.0 - 17.0 (g/dL)   HCT 16.1  09.6 - 04.5 (%)   MCV 84.7  78.0 - 100.0 (fL)   MCH 28.8  26.0 - 34.0 (pg)   MCHC 34.0  30.0 - 36.0 (g/dL)   RDW 40.9  81.1 - 91.4 (%)   Platelets 199  150 - 400 (K/uL)  DIFFERENTIAL      Component Value Range   Neutrophils Relative 80 (*) 43 - 77 (%)   Neutro Abs 7.8 (*) 1.7 - 7.7 (K/uL)   Lymphocytes Relative 10 (*) 12 - 46 (%)   Lymphs Abs 1.0  0.7 - 4.0 (K/uL)   Monocytes Relative 10  3 - 12 (%)   Monocytes Absolute 0.9  0.1 - 1.0 (K/uL)   Eosinophils Relative 0  0 - 5 (%)   Eosinophils Absolute 0.0  0.0 - 0.7 (K/uL)   Basophils Relative 0  0 - 1 (%)   Basophils Absolute 0.0  0.0 - 0.1 (K/uL)  URINALYSIS, ROUTINE W REFLEX MICROSCOPIC      Component Value Range   Color, Urine AMBER (*) YELLOW    APPearance CLEAR  CLEAR    Specific Gravity, Urine 1.016  1.005 - 1.030    pH 6.0  5.0 - 8.0    Glucose, UA NEGATIVE  NEGATIVE (mg/dL)   Hgb urine dipstick NEGATIVE  NEGATIVE    Bilirubin Urine NEGATIVE  NEGATIVE    Ketones, ur NEGATIVE  NEGATIVE (mg/dL)   Protein, ur NEGATIVE  NEGATIVE (mg/dL)   Urobilinogen, UA 0.2  0.0 - 1.0 (mg/dL)   Nitrite NEGATIVE  NEGATIVE    Leukocytes, UA NEGATIVE  NEGATIVE    Ct Abdomen Pelvis W Contrast  07/20/2011  *RADIOLOGY REPORT*  Clinical Data: Fever;  postoperative, status post ileostomy reversal on 06/28/2011.  Assess for intra-abdominal abscess.  History of colorectal cancer, status post proctectomy, chemotherapy and radiation therapy.  CT ABDOMEN AND PELVIS WITH CONTRAST  Technique:  Multidetector CT imaging of the abdomen and pelvis was performed following the standard protocol during bolus administration of intravenous contrast.  Contrast: OMNIPAQUE IOHEXOL 300 MG/ML IV SOLN  Comparison: CT of the chest, abdomen and pelvis performed 06/18/2011  Findings: Minimal bibasilar atelectasis is noted.  The liver and spleen are unremarkable in appearance.  The patient is status post cholecystectomy, with clips noted at the gallbladder fossa.  The pancreas and adrenal glands are unremarkable.  There is a 2 mm stone noted near the upper pole of the right kidney.  The kidneys are otherwise unremarkable in appearance. There is no evidence of hydronephrosis.  No obstructing ureteral stones are seen.  Mild nonspecific perinephric stranding is again noted bilaterally.  At the site of the patient's ileostomy reversal at the right lower quadrant, there is a large soft tissue phlegmon and focal ileal wall thickening.  Phlegmon and fluid tracks superiorly along the mesentery, without definite intra-abdominal abscess.  There is no evidence of obstruction; contrast progresses through the region of inflammation.  There is also mild inflammation of adjacent small bowel loops.  There is mild peripheral enhancement  about a small collection of fluid along the anterior abdominal wall of the right lower quadrant, measuring 4.9 x 2.0 x 1.7 cm.  This raises concern for a focal soft tissue abscess, though a postoperative seroma could have a similar appearance.  Surrounding soft tissue inflammation is seen.  The more proximal small bowel is unremarkable in appearance.  The stomach is within normal limits.  No acute vascular abnormalities are seen.  Scattered calcification is noted  along the distal abdominal aorta and its branches.  The appendix filled with air and is grossly unremarkable in appearance; minimal associated soft tissue stranding appears to be reactive secondary to the right lower quadrant process.  Mild soft tissue stranding about the sigmoid colon and rectum likely reflects prior radiation therapy.  The colon is otherwise unremarkable in appearance.  The bladder is mildly distended and grossly unremarkable appearance.  The prostate remains normal in size.  No inguinal lymphadenopathy is seen.  No acute osseous abnormalities are identified.  IMPRESSION:  1.  Mild peripheral enhancement about a small collection of fluid at the anterior abdominal wall along the right lower quadrant, measuring 4.9 x 2.0 x 1.7 cm at the site of ileostomy reversal. This raises concern for a small focal soft tissue abscess, though a postoperative seroma could have a similar appearance. 2.  Large soft tissue phlegmon and focal ileal wall thickening at the site of ileostomy reversal, with phlegmon and fluid tracking superiorly along the mesentery, but no evidence of intra-abdominal abscess.  No evidence of obstruction; contrast passes across the region of inflammation. 3.  Mild inflammation of the adjacent small bowel loops noted. 4.  Scattered calcification along the distal abdominal aorta and its branches. 5.  Mild soft tissue inflammation about the sigmoid colon and rectum likely reflects prior radiation therapy. 6.  2 mm nonobstructing stone noted near the upper pole of the right kidney.  Original Report Authenticated By: Tonia Ghent, M.D.      MDM  In light of patient's developing fever and suspected abscess on CT scan I have called [facility] to arrange for transfer. Spoke with Dr. Vear Clock who is accepting surgeon. Also spoke with Dr. Yetta Barre,, emergency physician who accepts patient in transfer to the emergency department at [facility]. In light of fever and fluid collection suspect  abscess Will initiate intravenous Flagyl and Cipro prior to transfer    Diagnosis #1 abdominal pain #2 abdominal wall abscess I personally performed the services described in this documentation, which was scribed in my presence. The recorded information has been reviewed and considered.   Eric Sou, MD 07/20/11 1610  Eric Sou, MD 07/20/11 [phone]

## 2011-07-21 MED ORDER — MORPHINE SULFATE 4 MG/ML IJ SOLN
INTRAMUSCULAR | Status: AC
  Administered 2011-07-21: 4 mg via INTRAVENOUS
  Filled 2011-07-21: qty 1

## 2011-07-21 MED ORDER — MORPHINE SULFATE 4 MG/ML IJ SOLN
4.0000 mg | Freq: Once | INTRAMUSCULAR | Status: AC
  Administered 2011-07-21: 4 mg via INTRAVENOUS

## 2011-07-22 MED FILL — Morphine Sulfate Inj 10 MG/ML: INTRAMUSCULAR | Qty: 1 | Status: AC

## 2011-07-25 ENCOUNTER — Ambulatory Visit (HOSPITAL_BASED_OUTPATIENT_CLINIC_OR_DEPARTMENT_OTHER): Payer: BC Managed Care – PPO

## 2011-07-25 DIAGNOSIS — Z469 Encounter for fitting and adjustment of unspecified device: Secondary | ICD-10-CM

## 2011-07-25 DIAGNOSIS — C20 Malignant neoplasm of rectum: Secondary | ICD-10-CM

## 2011-07-25 MED ORDER — SODIUM CHLORIDE 0.9 % IJ SOLN
10.0000 mL | INTRAMUSCULAR | Status: DC | PRN
  Administered 2011-07-25: 10 mL via INTRAVENOUS
  Filled 2011-07-25: qty 10

## 2011-07-25 MED ORDER — HEPARIN SOD (PORK) LOCK FLUSH 100 UNIT/ML IV SOLN
500.0000 [IU] | Freq: Once | INTRAVENOUS | Status: AC
  Administered 2011-07-25: 500 [IU] via INTRAVENOUS
  Filled 2011-07-25: qty 5

## 2011-08-20 ENCOUNTER — Other Ambulatory Visit: Payer: Self-pay | Admitting: *Deleted

## 2011-08-20 ENCOUNTER — Other Ambulatory Visit: Payer: Self-pay | Admitting: Hematology and Oncology

## 2011-08-20 DIAGNOSIS — C20 Malignant neoplasm of rectum: Secondary | ICD-10-CM

## 2011-08-21 ENCOUNTER — Telehealth: Payer: Self-pay | Admitting: Hematology and Oncology

## 2011-08-21 NOTE — Telephone Encounter (Signed)
Not able to reach pt re appts for April lb/ct/fu. Per vm [employer] customer not available. April appts mailed.

## 2011-10-01 ENCOUNTER — Ambulatory Visit (HOSPITAL_BASED_OUTPATIENT_CLINIC_OR_DEPARTMENT_OTHER): Payer: BC Managed Care – PPO

## 2011-10-01 VITALS — BP 131/85 | HR 73 | Temp 97.4°F

## 2011-10-01 DIAGNOSIS — Z452 Encounter for adjustment and management of vascular access device: Secondary | ICD-10-CM

## 2011-10-01 DIAGNOSIS — C189 Malignant neoplasm of colon, unspecified: Secondary | ICD-10-CM

## 2011-10-01 MED ORDER — SODIUM CHLORIDE 0.9 % IJ SOLN
10.0000 mL | INTRAMUSCULAR | Status: AC | PRN
  Administered 2011-10-01: 10 mL
  Filled 2011-10-01: qty 10

## 2011-10-01 MED ORDER — HEPARIN SOD (PORK) LOCK FLUSH 100 UNIT/ML IV SOLN
500.0000 [IU] | INTRAVENOUS | Status: AC | PRN
Start: 2011-10-01 — End: 2011-10-01
  Administered 2011-10-01: 500 [IU]
  Filled 2011-10-01: qty 5

## 2011-10-01 MED ORDER — HEPARIN SOD (PORK) LOCK FLUSH 100 UNIT/ML IV SOLN
250.0000 [IU] | INTRAVENOUS | Status: DC | PRN
  Filled 2011-10-01: qty 5

## 2011-10-01 MED ORDER — SODIUM CHLORIDE 0.9 % IJ SOLN
10.0000 mL | INTRAMUSCULAR | Status: DC | PRN
  Filled 2011-10-01: qty 10

## 2011-10-03 ENCOUNTER — Telehealth: Payer: Self-pay | Admitting: Nurse Practitioner

## 2011-10-03 NOTE — Telephone Encounter (Signed)
Received request from patient for a note for dentist stating patient is "cleared" for dental work.  Note sent to Dr. Dalene Carrow for signing.  Attempted to call patient but received no answer.  Will continue to try and contact.

## 2011-10-08 ENCOUNTER — Ambulatory Visit (HOSPITAL_COMMUNITY)
Admission: RE | Admit: 2011-10-08 | Discharge: 2011-10-08 | Disposition: A | Discharge status: Home / Self Care | Payer: BC Managed Care – PPO | Source: Ambulatory Visit | Attending: Hematology and Oncology | Admitting: Hematology and Oncology

## 2011-10-08 ENCOUNTER — Encounter (HOSPITAL_COMMUNITY): Payer: Self-pay

## 2011-10-08 ENCOUNTER — Other Ambulatory Visit (HOSPITAL_BASED_OUTPATIENT_CLINIC_OR_DEPARTMENT_OTHER): Payer: BC Managed Care – PPO | Admitting: Lab

## 2011-10-08 DIAGNOSIS — Z9221 Personal history of antineoplastic chemotherapy: Secondary | ICD-10-CM | POA: Insufficient documentation

## 2011-10-08 DIAGNOSIS — R918 Other nonspecific abnormal finding of lung field: Secondary | ICD-10-CM | POA: Insufficient documentation

## 2011-10-08 DIAGNOSIS — N200 Calculus of kidney: Secondary | ICD-10-CM | POA: Insufficient documentation

## 2011-10-08 DIAGNOSIS — Z9089 Acquired absence of other organs: Secondary | ICD-10-CM | POA: Insufficient documentation

## 2011-10-08 DIAGNOSIS — Z923 Personal history of irradiation: Secondary | ICD-10-CM | POA: Insufficient documentation

## 2011-10-08 DIAGNOSIS — Z980 Intestinal bypass and anastomosis status: Secondary | ICD-10-CM | POA: Insufficient documentation

## 2011-10-08 DIAGNOSIS — Z9049 Acquired absence of other specified parts of digestive tract: Secondary | ICD-10-CM | POA: Insufficient documentation

## 2011-10-08 DIAGNOSIS — C20 Malignant neoplasm of rectum: Secondary | ICD-10-CM

## 2011-10-08 LAB — CBC WITH DIFFERENTIAL/PLATELET
BASO%: 0.6 % (ref 0.0–2.0)
EOS%: 4.6 % (ref 0.0–7.0)
HGB: 15.1 g/dL (ref 13.0–17.1)
MCH: 27.9 pg (ref 27.2–33.4)
MCHC: 34.1 g/dL (ref 32.0–36.0)
MCV: 81.7 fL (ref 79.3–98.0)
MONO%: 10 % (ref 0.0–14.0)
RBC: 5.4 10*6/uL (ref 4.20–5.82)
RDW: 13.8 % (ref 11.0–14.6)
lymph#: 0.9 10*3/uL (ref 0.9–3.3)
nRBC: 0 % (ref 0–0)

## 2011-10-08 LAB — CEA: CEA: 6.4 ng/mL — ABNORMAL HIGH (ref 0.0–5.0)

## 2011-10-08 LAB — CMP (CANCER CENTER ONLY)
ALT(SGPT): 27 U/L (ref 10–47)
AST: 27 U/L (ref 11–38)
Alkaline Phosphatase: 51 U/L (ref 26–84)
CO2: 28 mEq/L (ref 18–33)
Creat: 0.9 mg/dl (ref 0.6–1.2)
Sodium: 143 mEq/L (ref 128–145)
Total Bilirubin: 0.7 mg/dl (ref 0.20–1.60)
Total Protein: 7.3 g/dL (ref 6.4–8.1)

## 2011-10-08 MED ORDER — IOHEXOL 300 MG/ML  SOLN
100.0000 mL | Freq: Once | INTRAMUSCULAR | Status: AC | PRN
  Administered 2011-10-08: 100 mL via INTRAVENOUS

## 2011-10-10 ENCOUNTER — Encounter: Payer: Self-pay | Admitting: Hematology and Oncology

## 2011-10-10 ENCOUNTER — Ambulatory Visit (HOSPITAL_BASED_OUTPATIENT_CLINIC_OR_DEPARTMENT_OTHER): Payer: BC Managed Care – PPO | Admitting: Hematology and Oncology

## 2011-10-10 ENCOUNTER — Telehealth: Payer: Self-pay | Admitting: Hematology and Oncology

## 2011-10-10 VITALS — BP 129/72 | HR 80 | Temp 97.5°F | Ht 71.0 in | Wt 202.7 lb

## 2011-10-10 DIAGNOSIS — R159 Full incontinence of feces: Secondary | ICD-10-CM

## 2011-10-10 DIAGNOSIS — C20 Malignant neoplasm of rectum: Secondary | ICD-10-CM

## 2011-10-10 DIAGNOSIS — R911 Solitary pulmonary nodule: Secondary | ICD-10-CM

## 2011-10-10 NOTE — Progress Notes (Signed)
CC:   [name], M.D. [name], M.D. [name], M.D. [name], MD  IDENTIFYING STATEMENT:  Patient is a 46 year old man with rectal cancer who presents for followup.  INTERVAL HISTORY:  [name] has no unusual complaints.  He has undergone rectal reversal.  He has periods of fecal incontinence. His weight  is stable.  He denies pain.  He received a CT scan of the chest, abdomen, pelvis on 10/08/2011.  The 3 tiny tumor nodules in the lungs bilaterally are unchanged when compared to a prior study of 01/24/2007 and felt to be considered radiographically benign. There  was a 4 mm nodule in the lateral segment of right middle lobe which is unchanged compared to the patient's most recent prior exam, on 06/18/2011.  The liver, spleen, pancreas and adrenal glands were unremarkable.  He was shown to be status post proctectomy and reversal of right lower quadrant ileostomy with clean anastomosis.  There was no abdominal or pelvic adenopathy.  MEDICATIONS:  Reviewed and updated.  PAST MEDICAL HISTORY/FAMILY HISTORY/SOCIAL HISTORY:  Unchanged.  REVIEW OF SYSTEMS:  10 point review of systems is negative.  PHYSICAL EXAM:  The patient is a well-appearing, well-nourished, man in no distress.  Vitals: pulse 83, blood pressure 129/72, Temperature 97.5, respirations 20, weight 202.7 pounds.  HEENT:  Head is atraumatic, normocephalic.  Sclerae anicteric.  Mouth moist.  Neck:  Supple.  Chest: Clear.  CVS:  Regular heart sounds  present.  No added sounds or murmurs.  Abdomen:  Soft, nontender.  Bowel sounds present. Extremities:  No edema.  Pulses present and symmetrical.  LAB DATA:  10/08/2011 white cell count 3.6, hemoglobin 15.1, hematocrit 44.1, platelets 160.  Sodium 143, potassium 4.3, chloride 105, CO2 28, BUN 12, creatinine 0.9, glucose 107.  T bilirubin 0.7, alkaline phos 51, AST 27, ALT 27, calcium 8.7, CEA is 5.4. 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  He is status post 6 cycles of oxaliplatin based therapy with 2 cycles of FOLFOX and 4 cycles of XELOX. He began external radiation therapy with continuous infusion 5-FU from April 16, 2011 through May 21, 2011.  CT scan shows a stable lung nodule for  which I would recommend surveillance.  He returns in 4 months' time with  repeat CT scan.   ______________________________ Laurice Record, M.D. LIO/MEDQ  D:  10/10/2011  T:  10/10/2011  Job:  [id number]

## 2011-10-10 NOTE — Telephone Encounter (Signed)
Gv pt appts for may-aug2013. ct scan at 08/02 @ [facility].

## 2011-10-10 NOTE — Progress Notes (Signed)
This office note has been dictated.

## 2011-10-28 ENCOUNTER — Encounter: Payer: Self-pay | Admitting: Hematology and Oncology

## 2011-12-13 HISTORY — PX: SACRAL NERVE STIMULATOR PLACEMENT: SHX2367

## 2012-01-23 ENCOUNTER — Ambulatory Visit (HOSPITAL_BASED_OUTPATIENT_CLINIC_OR_DEPARTMENT_OTHER): Payer: BC Managed Care – PPO

## 2012-01-23 DIAGNOSIS — C20 Malignant neoplasm of rectum: Secondary | ICD-10-CM

## 2012-01-23 DIAGNOSIS — C189 Malignant neoplasm of colon, unspecified: Secondary | ICD-10-CM

## 2012-01-23 DIAGNOSIS — Z452 Encounter for adjustment and management of vascular access device: Secondary | ICD-10-CM

## 2012-01-23 MED ORDER — HEPARIN SOD (PORK) LOCK FLUSH 100 UNIT/ML IV SOLN
500.0000 [IU] | Freq: Once | INTRAVENOUS | Status: AC
  Administered 2012-01-23: 500 [IU] via INTRAVENOUS
  Filled 2012-01-23: qty 5

## 2012-01-23 MED ORDER — SODIUM CHLORIDE 0.9 % IJ SOLN
10.0000 mL | INTRAMUSCULAR | Status: DC | PRN
  Administered 2012-01-23: 10 mL via INTRAVENOUS
  Filled 2012-01-23: qty 10

## 2012-01-29 ENCOUNTER — Emergency Department (HOSPITAL_COMMUNITY): Payer: BC Managed Care – PPO

## 2012-01-29 ENCOUNTER — Encounter (HOSPITAL_COMMUNITY): Payer: Self-pay | Admitting: *Deleted

## 2012-01-29 ENCOUNTER — Emergency Department (HOSPITAL_COMMUNITY)
Admission: EM | Admit: 2012-01-29 | Discharge: 2012-01-29 | Disposition: A | Discharge status: Home / Self Care | Payer: BC Managed Care – PPO | Attending: Emergency Medicine | Admitting: Emergency Medicine

## 2012-01-29 DIAGNOSIS — Z85038 Personal history of other malignant neoplasm of large intestine: Secondary | ICD-10-CM | POA: Insufficient documentation

## 2012-01-29 DIAGNOSIS — W208XXA Other cause of strike by thrown, projected or falling object, initial encounter: Secondary | ICD-10-CM | POA: Insufficient documentation

## 2012-01-29 DIAGNOSIS — Y93H2 Activity, gardening and landscaping: Secondary | ICD-10-CM | POA: Insufficient documentation

## 2012-01-29 DIAGNOSIS — IMO0002 Reserved for concepts with insufficient information to code with codable children: Secondary | ICD-10-CM

## 2012-01-29 DIAGNOSIS — Z87891 Personal history of nicotine dependence: Secondary | ICD-10-CM | POA: Insufficient documentation

## 2012-01-29 DIAGNOSIS — Y92009 Unspecified place in unspecified non-institutional (private) residence as the place of occurrence of the external cause: Secondary | ICD-10-CM | POA: Insufficient documentation

## 2012-01-29 DIAGNOSIS — S0180XA Unspecified open wound of other part of head, initial encounter: Secondary | ICD-10-CM | POA: Insufficient documentation

## 2012-01-29 DIAGNOSIS — S0990XA Unspecified injury of head, initial encounter: Secondary | ICD-10-CM | POA: Insufficient documentation

## 2012-01-29 DIAGNOSIS — E785 Hyperlipidemia, unspecified: Secondary | ICD-10-CM | POA: Insufficient documentation

## 2012-01-29 DIAGNOSIS — S01409A Unspecified open wound of unspecified cheek and temporomandibular area, initial encounter: Secondary | ICD-10-CM | POA: Insufficient documentation

## 2012-01-29 MED ORDER — OXYCODONE-ACETAMINOPHEN 5-325 MG PO TABS
1.0000 | ORAL_TABLET | Freq: Once | ORAL | Status: AC
  Administered 2012-01-29: 1 via ORAL
  Filled 2012-01-29 (×2): qty 1

## 2012-01-29 MED ORDER — OXYCODONE-ACETAMINOPHEN 5-325 MG PO TABS: 1.0000 | ORAL_TABLET | Freq: Four times a day (QID) | ORAL | Status: AC | PRN

## 2012-01-29 NOTE — ED Provider Notes (Signed)
Medical screening examination/treatment/procedure(s) were performed by non-physician practitioner and as supervising physician I was immediately available for consultation/collaboration.   Aslan Montagna B. Bernette Mayers, MD 01/29/12 [phone]

## 2012-01-29 NOTE — ED Notes (Signed)
To ED for eval of lac above right eye after having a lawnmower fall onto head while changing the blades. No LOC. Bleeding controlled. Hematoma to back of head. Ambulatory into triage.

## 2012-01-29 NOTE — ED Provider Notes (Signed)
History     CSN: [id number]  Arrival date & time 01/29/12  1013   First MD Initiated Contact with Patient 01/29/12 1026      Chief Complaint  Patient presents with  . Laceration  . Head Injury    (Consider location/radiation/quality/duration/timing/severity/associated sxs/prior treatment) HPI Comments: 46 y/o male presents with 2 lacerations to face s/p a lawn mower hitting him in the face about 45 minutes ago. He was laying on the ground trying to change the blades of the lawnmower and it fell on him. He hit the back of his head and now has a headache. Rates pain 9/10. Denies any LOC, dizziness, lightheadedness, visual disturbances, nausea, neck pain. Wife states he definitely had a tetanus shot within the past 5 years.   Patient is a 46 y.o. male presenting with skin laceration and head injury. The history is provided by the patient and the spouse.  Laceration   Head Injury     Past Medical History  Diagnosis Date  . Gout   . Hyperlipemia   . Hemorrhoid   . Cancer     rectum  . Colorectal cancer     Past Surgical History  Procedure Date  . Proctectomy 08/16/2010    coloanal anastomosis and ileostomy  . Inguinal hernia repair   . Ileostomy     reversal 06/28/11  . Ileostomy reversal   . Cholecystectomy     Family History  Problem Relation Age of Onset  . Cancer Maternal Grandfather     throat  . Cancer Cousin     breast, maternal  . Cancer Maternal Grandmother     unknown primary    History  Substance Use Topics  . Smoking status: Former Smoker -- 1.0 packs/day for 18 years    Types: Cigarettes    Quit date: 10/09/1997  . Smokeless tobacco: Not on file  . Alcohol Use: Yes     occasionally      Review of Systems  HENT: Negative for neck pain.   Eyes: Negative for visual disturbance.  Gastrointestinal: Negative for nausea.  Skin: Positive for wound (lacerations to face).  Neurological: Positive for headaches. Negative for dizziness and  light-headedness.       No LOC    Allergies  Penicillins  Home Medications   Current Outpatient Rx  Name Route Sig Dispense Refill  . DICYCLOMINE HCL 10 MG PO CAPS Oral Take 10 mg by mouth 4 (four) times daily -  before meals and at bedtime.    Marland Kitchen GLUCOSAMINE HCL PO Oral Take 1 tablet by mouth daily.    Marland Kitchen HYDROCODONE-ACETAMINOPHEN 5-325 MG PO TABS Oral Take 1 tablet by mouth every 6 (six) hours as needed. For pain    . LOPERAMIDE HCL 2 MG PO CAPS Oral Take 2 mg by mouth 4 (four) times daily as needed. For diarrhea    . OXYCODONE HCL 5 MG PO TABS Oral Take 5 mg by mouth every 4 (four) hours as needed. For pain    . VITAMIN B-6 PO Oral Take 1 tablet by mouth daily.    Marland Kitchen ROSUVASTATIN CALCIUM 5 MG PO TABS Oral Take 5 mg by mouth at bedtime.      BP 139/77  Pulse 70  Temp 98.1 F (36.7 C) (Oral)  Resp 18  SpO2 98%  Physical Exam  Constitutional: He is oriented to person, place, and time. He appears well-developed and well-nourished. No distress.  HENT:  Nose: Nose normal.  Mouth/Throat: Uvula is midline,  oropharynx is clear and moist and mucous membranes are normal.       1.5 inch diameter soft squishy palpable mass on left posterior scalp. No overlying ecchymosis. Tender to palpation.  Eyes: Conjunctivae and EOM are normal. Pupils are equal, round, and reactive to light.  Neck: Neck supple. No spinous process tenderness present. Normal range of motion present.  Cardiovascular: Normal rate, regular rhythm and normal heart sounds.   Pulmonary/Chest: Effort normal and breath sounds normal.  Neurological: He is alert and oriented to person, place, and time. No cranial nerve deficit or sensory deficit. Coordination and gait normal.  Skin: Skin is warm. Laceration (3cm along right eyebrow and 1 cm on right cheek) noted. No bruising noted.       No evidence of foreign body  Psychiatric: He has a normal mood and affect. His behavior is normal.    ED Course  Procedures (including  critical care time) LACERATION REPAIR Performed by: Johnnette Gourd Authorized by: Johnnette Gourd Consent: Verbal consent obtained. Risks and benefits: risks, benefits and alternatives were discussed Consent given by: patient Patient identity confirmed: provided demographic data Prepped and Draped in normal sterile fashion Wound explored  Laceration Location: right eyebrow and right cheek  Laceration Length: 3 cm eye brow and 1 cm cheek  No Foreign Bodies seen or palpated  Anesthesia: local infiltration  Local anesthetic: lidocaine 2% without epinephrine  Anesthetic total: 2 ml into eyebrow 1 ml into cheek  Irrigation method: syringe Amount of cleaning: standard  Skin closure: 6-0 prolene  Number of sutures: 4 in eyebrow 1 on cheek  Technique: simple interrupted  Patient tolerance: Patient tolerated the procedure well with no immediate complications.   Labs Reviewed - No data to display Dg Facial Bones Complete  01/29/2012  *RADIOLOGY REPORT*  Clinical Data: Right facial injury  FACIAL BONES COMPLETE 3+V  Comparison: None.  Findings: No fluid in the sinuses.  No facial fracture.  IMPRESSION: Negative  Original Report Authenticated By: Thomasenia Sales, M.D.     1. Laceration   2. Head injury       MDM  46 y/o male with lacerations due to lawn mower falling on him. Tolerated laceration repair. No visual disturbance post repair above right eye. Patient also with head injury. No red flags concerning need for CT scan at this time. No focal neuro deficits present. Educated patient and wife on red flags to look out for to return to ED.        Trevor Mace, PA-C 01/29/12 1159

## 2012-02-07 ENCOUNTER — Telehealth: Payer: Self-pay | Admitting: Nurse Practitioner

## 2012-02-07 ENCOUNTER — Encounter (HOSPITAL_COMMUNITY): Payer: Self-pay

## 2012-02-07 ENCOUNTER — Other Ambulatory Visit: Payer: Self-pay | Admitting: Hematology and Oncology

## 2012-02-07 ENCOUNTER — Ambulatory Visit (HOSPITAL_COMMUNITY)
Admission: RE | Admit: 2012-02-07 | Discharge: 2012-02-07 | Disposition: A | Discharge status: Home / Self Care | Payer: BC Managed Care – PPO | Source: Ambulatory Visit | Attending: Hematology and Oncology | Admitting: Hematology and Oncology

## 2012-02-07 ENCOUNTER — Other Ambulatory Visit (HOSPITAL_BASED_OUTPATIENT_CLINIC_OR_DEPARTMENT_OTHER): Payer: BC Managed Care – PPO | Admitting: Lab

## 2012-02-07 DIAGNOSIS — K7689 Other specified diseases of liver: Secondary | ICD-10-CM | POA: Insufficient documentation

## 2012-02-07 DIAGNOSIS — C20 Malignant neoplasm of rectum: Secondary | ICD-10-CM

## 2012-02-07 DIAGNOSIS — R911 Solitary pulmonary nodule: Secondary | ICD-10-CM | POA: Insufficient documentation

## 2012-02-07 DIAGNOSIS — N200 Calculus of kidney: Secondary | ICD-10-CM | POA: Insufficient documentation

## 2012-02-07 LAB — CBC WITH DIFFERENTIAL/PLATELET
BASO%: 0.8 % (ref 0.0–2.0)
Eosinophils Absolute: 0.2 10*3/uL (ref 0.0–0.5)
HCT: 42.3 % (ref 38.4–49.9)
MCHC: 33.8 g/dL (ref 32.0–36.0)
MONO#: 0.4 10*3/uL (ref 0.1–0.9)
NEUT#: 2.4 10*3/uL (ref 1.5–6.5)
NEUT%: 61.6 % (ref 39.0–75.0)
RBC: 5.06 10*6/uL (ref 4.20–5.82)
WBC: 3.8 10*3/uL — ABNORMAL LOW (ref 4.0–10.3)
lymph#: 0.9 10*3/uL (ref 0.9–3.3)

## 2012-02-07 LAB — CMP (CANCER CENTER ONLY)
Albumin: 3.8 g/dL (ref 3.3–5.5)
Alkaline Phosphatase: 53 U/L (ref 26–84)
Calcium: 9.3 mg/dL (ref 8.0–10.3)
Chloride: 104 mEq/L (ref 98–108)
Glucose, Bld: 107 mg/dL (ref 73–118)
Potassium: 4.5 mEq/L (ref 3.3–4.7)
Sodium: 142 mEq/L (ref 128–145)
Total Protein: 7.2 g/dL (ref 6.4–8.1)

## 2012-02-07 LAB — CEA: CEA: 14.9 ng/mL — ABNORMAL HIGH (ref 0.0–5.0)

## 2012-02-07 MED ORDER — IOHEXOL 300 MG/ML  SOLN
100.0000 mL | Freq: Once | INTRAMUSCULAR | Status: AC | PRN
  Administered 2012-02-07: 100 mL via INTRAVENOUS

## 2012-02-07 NOTE — Telephone Encounter (Signed)
Message copied by Barbara Cower on Fri Feb 07, 2012  2:19 PM ------      Message from: Arlan Organ I      Created: Fri Feb 07, 2012  1:19 PM       Call and tell pt that CT shows increasing adenopathy so i plan to schedule a PET to make sure.  May need to change his follow up if Pet not scheduled in time.            Thanks            LO

## 2012-02-07 NOTE — Telephone Encounter (Signed)
Informed patient per Dr. Lonell Face note below.

## 2012-02-10 ENCOUNTER — Telehealth: Payer: Self-pay | Admitting: Hematology and Oncology

## 2012-02-10 NOTE — Telephone Encounter (Signed)
Per staff message from LO moved 8/6 appt to 8:14 @ 12:30 pm. S/w pt re change he is aware and also aware of pet scan 8/8.

## 2012-02-10 NOTE — Telephone Encounter (Signed)
S/w pt re appt for 8/8 pet scan. Pt also aware that message has been sent to LO re 8/6 appt. I am not sure if pt needs to keep this appt. Message to LO.

## 2012-02-11 ENCOUNTER — Ambulatory Visit: Payer: BC Managed Care – PPO | Admitting: Hematology and Oncology

## 2012-02-11 ENCOUNTER — Telehealth: Payer: Self-pay | Admitting: *Deleted

## 2012-02-11 ENCOUNTER — Encounter: Payer: Self-pay | Admitting: Hematology and Oncology

## 2012-02-11 NOTE — Progress Notes (Signed)
02/11/2012  Good afternoon Dr. Dalene Carrow, [name] is scheduled for a PET SCAN on 02/13/2012 @ [facility].  This patient has [employer], an [employer] PPO policy that can be difficult sometimes.  The request for this PET SCAN has gone to Peer-to-Peer review.  I read the nurse reviewer your last clinical note and also the report of his chest, abdomen and pelvic ct scans.  Because the time frame has not been a year since his last PET SCAN it had to go to another level.  I have listed below the information you or your PA can use to complete this request.  I will await your direction.  [phone]   [address] [name] Policy Number: [id number] DOB: [birth year]

## 2012-02-11 NOTE — Telephone Encounter (Signed)
Dr. Dalene Carrow received approval for pt's  PET scan scheduled for 02/13/12   -   Authorization  #   [id number].

## 2012-02-13 ENCOUNTER — Encounter (HOSPITAL_COMMUNITY): Payer: Self-pay

## 2012-02-13 ENCOUNTER — Encounter (HOSPITAL_COMMUNITY)
Admission: RE | Admit: 2012-02-13 | Discharge: 2012-02-13 | Disposition: A | Discharge status: Home / Self Care | Payer: BC Managed Care – PPO | Source: Ambulatory Visit | Attending: Hematology and Oncology | Admitting: Hematology and Oncology

## 2012-02-13 DIAGNOSIS — R599 Enlarged lymph nodes, unspecified: Secondary | ICD-10-CM | POA: Insufficient documentation

## 2012-02-13 DIAGNOSIS — C20 Malignant neoplasm of rectum: Secondary | ICD-10-CM | POA: Insufficient documentation

## 2012-02-13 MED ORDER — FLUDEOXYGLUCOSE F - 18 (FDG) INJECTION
15.6000 | Freq: Once | INTRAVENOUS | Status: AC | PRN
  Administered 2012-02-13: 15.6 via INTRAVENOUS

## 2012-02-19 ENCOUNTER — Encounter: Payer: Self-pay | Admitting: Hematology and Oncology

## 2012-02-19 ENCOUNTER — Ambulatory Visit (HOSPITAL_BASED_OUTPATIENT_CLINIC_OR_DEPARTMENT_OTHER): Payer: BC Managed Care – PPO | Admitting: Hematology and Oncology

## 2012-02-19 ENCOUNTER — Other Ambulatory Visit: Payer: Self-pay | Admitting: *Deleted

## 2012-02-19 ENCOUNTER — Telehealth: Payer: Self-pay | Admitting: Hematology and Oncology

## 2012-02-19 VITALS — BP 143/86 | HR 60 | Temp 97.3°F | Resp 20 | Ht 71.0 in | Wt 205.6 lb

## 2012-02-19 DIAGNOSIS — C774 Secondary and unspecified malignant neoplasm of inguinal and lower limb lymph nodes: Secondary | ICD-10-CM

## 2012-02-19 DIAGNOSIS — C20 Malignant neoplasm of rectum: Secondary | ICD-10-CM

## 2012-02-19 DIAGNOSIS — R918 Other nonspecific abnormal finding of lung field: Secondary | ICD-10-CM

## 2012-02-19 HISTORY — DX: Other nonspecific abnormal finding of lung field: R91.8

## 2012-02-19 MED ORDER — CAPECITABINE 500 MG PO TABS: 1000.0000 mg | ORAL_TABLET | Freq: Two times a day (BID) | ORAL | Status: DC

## 2012-02-19 NOTE — Telephone Encounter (Signed)
Gave pt appt to see Dr. Kathrynn Running Monday,02/24/12

## 2012-02-19 NOTE — Progress Notes (Signed)
CC:   [name], M.D. Adolph Pollack, M.D. [name], M.D. [name], MD  IDENTIFYING STATEMENT:  The patient is a 46 year old man with history of rectal cancer who presents to discuss results of recent scans.  INTERVAL HISTORY:  Mr. Skora has undergone colostomy reversal and appears to have ongoing problems with this, specifically frequency of bowel movements.  Otherwise he is eating well.  His weight is stable.  A recent CT scan of his chest, abdomen, and pelvis on February 07, 2012 revealed no acute process or evidence of disease in the chest.  The small pulmonary nodules previously seen remain stable, including a  right middle lobe nodule.  The abdomen and pelvis revealed moderate hepatic steatosis with no focal liver lesions.  However, there was a precaval node measuring 1.0 x 1.4 cm previously 0.8 x 1.2 cm and an aortocaval node measuring 1.2 cm, previously 1 cm noted.  There was no evidence of omental or peritoneal disease.  There was no acute osseous abnormality.  CEA level had increased to 14.9, previously 6.4 four months ago.  As a result of this, the patient went on to receive a PET scan on 02/13/2012.  There were no hypermetabolic mediastinal or hilar nodes or suspicious pulmonary nodules.  There was no focal hypermetabolic activity to suggest skeletal metastases.  There was increased hypermetabolic retroperitoneal lymphadenopathy in the aortocaval space with an SUV of 5.4, consistent with metastatic disease. In addition, there was increased hypermetabolic soft tissue fullness of the anorectal junction and local recurrence of rectal tumor could not be excluded and it was suggested that correlation with a rectal exam was required.  MEDICATIONS:  Reviewed and updated.  PAST MEDICAL HISTORY/FAMILY HISTORY/SOCIAL HISTORY:  Unchanged.  REVIEW OF SYSTEMS:  Ten-point review of systems negative.  PHYSICAL EXAMINATION:  General:  The patient is a  well-appearing, well- nourished man in no distress.  Vitals:  Pulse 60, blood pressure 143/86, temperature 97.3, respirations 20, weight 205.6 pounds.  HEENT:  Head is atraumatic, normocephalic.  Sclerae are anicteric.  Mouth moist.  Neck: Supple.  Chest:  Clear.  Port:  No signs of infection.  CVS: Unremarkable.  Abdomen:  Reveals a well-healed surgical scar.  No hepatosplenomegaly.  Bowel sounds present.  Rectal Exam:  Deferred. Extremities:  No calf tenderness.  Lymph Nodes:  No palpable cervical, axillary, or inguinal adenopathy.  CNS:  Nonfocal.  LABORATORY DATA:  02/07/2012 white cell count 3.8, hemoglobin 14.3, hematocrit 42.3, platelets 179.  Sodium 142, potassium 4.5, chloride 104, CO2 is 26, creatinine 1.  T-bili 0.8, alkaline phosphatase 53, AST 31, ALT 37.  CEA 14.9.  Results of CT and PET scan as in interval history.  IMPRESSION AND PLAN:  Mr. Mcgreal is a 46 year old man with progression of rectal cancer now involving aortocaval lymph nodes.  He does not have any other areas of metastatic disease.  Since he has localized disease,   he may be a candidate for CyberKnife radiation, which can be given concurrently with Xeloda.  I discussed with Dr. Kathrynn Running, who will be seeing the patient later on this week.  If the patient is willing to proceed with radiation, he will also recieve Xeloda at 1000 mg p.o. b.i.d. daily for the duration of radiation.  We will request his pathology tissue block from [facility] to have it tested for K-ras mutation.  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  He is status post 6 cycles of oxaliplatin-based therapy with 2 cycles of FOLFOX and 4 cycles of XELOX.  He began external radiation therapy with continuous infusion 5-FU from April 16, 2011, through May 21, 2011.  He has since  had colostomy reversal.  The patient was highly depressed about the news and I recommend that he touch base with his PCP, Dr. [name] and he attends some of the supportive groups at the [facility].   He follows up soon.    ______________________________ Laurice Record, M.D. LIO/MEDQ  D:  02/19/2012  T:  02/19/2012  Job:  [id number]

## 2012-02-19 NOTE — Progress Notes (Signed)
This office note has been dictated.

## 2012-02-19 NOTE — Patient Instructions (Addendum)
[name]  [id number]  [facility] Discharge Instructions  RECOMMENDATIONS MADE BY THE CONSULTANT AND ANY TEST RESULTS WILL BE SENT TO YOUR REFERRING DOCTOR.   EXAM FINDINGS BY MD TODAY AND SIGNS AND SYMPTOMS TO REPORT TO CLINIC OR PRIMARY MD:   Your current list of medications are: Current Outpatient Prescriptions  Medication Sig Dispense Refill  . dicyclomine (BENTYL) 10 MG capsule Take 10 mg by mouth 4 (four) times daily -  before meals and at bedtime.      Marland Kitchen GLUCOSAMINE HCL PO Take 1 tablet by mouth daily.      Marland Kitchen HYDROcodone-acetaminophen (NORCO) 5-325 MG per tablet Take 1 tablet by mouth every 6 (six) hours as needed. For pain      . loperamide (IMODIUM) 2 MG capsule Take 2 mg by mouth 4 (four) times daily as needed. For diarrhea      . oxyCODONE (OXY IR/ROXICODONE) 5 MG immediate release tablet Take 5 mg by mouth every 4 (four) hours as needed. For pain      . Pyridoxine HCl (VITAMIN B-6 PO) Take 1 tablet by mouth daily.      . rosuvastatin (CRESTOR) 5 MG tablet Take 5 mg by mouth at bedtime.         INSTRUCTIONS GIVEN AND DISCUSSED:   SPECIAL INSTRUCTIONS/FOLLOW-UP:  See above.  I acknowledge that I have been informed and understand all the instructions given to me and received a copy. I do not have any more questions at this time, but understand that I may call the [facility] at [phone] during business hours should I have any further questions or need assistance in obtaining follow-up care.

## 2012-02-20 ENCOUNTER — Telehealth: Payer: Self-pay | Admitting: *Deleted

## 2012-02-20 ENCOUNTER — Encounter: Payer: Self-pay | Admitting: Hematology and Oncology

## 2012-02-20 NOTE — Telephone Encounter (Signed)
Spoke with Silas Flood, RN @ [facility] .   Per Marchelle Folks , she would request KRAS testing on pt , and will fax results to Dr. Dalene Carrow for review when available. Silas Flood, RN   [phone].

## 2012-02-20 NOTE — Telephone Encounter (Signed)
RN had left message on voice mail for Silas Flood, RN @ Dr. Epifania Gore at [facility] for her to call nurse back.   No response from [facility] yesterday 02/19/12. Called Melissa, scheduler @ [facility] again today.  Asked Melissa to relay message to [place] re:  If Dr. Epifania Gore would test pt's tumor slides for KRAS , and fax results to Dr. Lonell Face office.  If not,  Asked Marchelle Folks to send slides back to our pathology dept in order for the test to be done.   Gave Melissa direct nurse's phone and fax numbers for further communication as needed.  Melissa stated she would relay message to [place], [place] today. Melissa   Phone    [phone].

## 2012-02-20 NOTE — Progress Notes (Signed)
Faxed xeloda prescription to [facility] @ [phone] phone # [phone]. ° °

## 2012-02-21 ENCOUNTER — Encounter: Payer: Self-pay | Admitting: Radiation Oncology

## 2012-02-24 ENCOUNTER — Encounter: Payer: Self-pay | Admitting: Radiation Oncology

## 2012-02-24 ENCOUNTER — Ambulatory Visit
Admission: RE | Admit: 2012-02-24 | Discharge: 2012-02-24 | Disposition: A | Discharge status: Home / Self Care | Payer: BC Managed Care – PPO | Source: Ambulatory Visit | Attending: Radiation Oncology | Admitting: Radiation Oncology

## 2012-02-24 ENCOUNTER — Institutional Professional Consult (permissible substitution): Payer: BC Managed Care – PPO | Admitting: Radiation Oncology

## 2012-02-24 VITALS — BP 137/83 | HR 63 | Temp 98.2°F | Wt 206.9 lb

## 2012-02-24 DIAGNOSIS — Z79899 Other long term (current) drug therapy: Secondary | ICD-10-CM | POA: Insufficient documentation

## 2012-02-24 DIAGNOSIS — C772 Secondary and unspecified malignant neoplasm of intra-abdominal lymph nodes: Secondary | ICD-10-CM

## 2012-02-24 DIAGNOSIS — C20 Malignant neoplasm of rectum: Secondary | ICD-10-CM | POA: Insufficient documentation

## 2012-02-24 DIAGNOSIS — Z87891 Personal history of nicotine dependence: Secondary | ICD-10-CM | POA: Insufficient documentation

## 2012-02-24 DIAGNOSIS — R197 Diarrhea, unspecified: Secondary | ICD-10-CM | POA: Insufficient documentation

## 2012-02-24 DIAGNOSIS — Z510 Encounter for antineoplastic radiation therapy: Secondary | ICD-10-CM | POA: Insufficient documentation

## 2012-02-24 DIAGNOSIS — M109 Gout, unspecified: Secondary | ICD-10-CM | POA: Insufficient documentation

## 2012-02-24 NOTE — Progress Notes (Addendum)
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  He now has a rising CEA and PET demonstrates two isolated paraaortic lymph node metastases.  PREVIOUS RADIATION THERAPY: Yes as above  PAST MEDICAL HISTORY:  has a past medical history of Gout; Hyperlipemia; Hemorrhoid; Colorectal cancer; S/P radiation therapy (04/16/11 - 05/23/11); Frequent bowel movements (Noted 02/19/12); Pulmonary nodules (02/19/12); Hepatic steatosis; Lymphadenopathy, retroperitoneal (Pet Scan 02/13/12); Status post chemotherapy (04/16/11 - 05/21/11); Depressed (Noted 02/19/12); Testicular atrophy; and ED (erectile dysfunction).    PAST SURGICAL HISTORY: Past Surgical History  Procedure Date  . Proctectomy 08/16/2010     low anterior resection,coloanal anastomosis and ileostomy  . Inguinal hernia repair     RIGHT  . Ileostomy     reversal 06/28/11  . Ileostomy reversal   . Cholecystectomy   . Portacath placement   . Sacral nerve stimulator placement 12/13/11    Decrease incontinence    FAMILY HISTORY: family history includes Cancer in his cousin, maternal grandfather, and maternal grandmother.  SOCIAL HISTORY:  reports that he quit smoking about 14 years ago. His smoking use included Cigarettes. He has a 18 pack-year smoking history. He does not have any smokeless tobacco history on file. He reports that he drinks alcohol. He reports that he uses illicit drugs  (Marijuana).  ALLERGIES: Penicillins  MEDICATIONS:  Current Outpatient Prescriptions  Medication Sig Dispense Refill  . capecitabine (XELODA) 500 MG tablet Take 2 tablets (1,000 mg total) by mouth 2 (two) times daily after a meal. Take 2 tabs = 1000 mg po  BID -  To begin with and for the duration of radiation.  168 tablet  0  . dicyclomine (BENTYL) 10 MG capsule Take 10 mg by mouth 3 (three) times daily.       Marland Kitchen GLUCOSAMINE HCL PO Take 1 tablet by mouth daily.      Marland Kitchen loperamide (IMODIUM) 2 MG capsule Take 2 mg by mouth 4 (four) times daily as needed. For diarrhea      . oxyCODONE (OXY IR/ROXICODONE) 5 MG immediate release tablet Take 5 mg by mouth every 4 (four) hours as needed. For pain      . Pyridoxine HCl (VITAMIN B-6 PO) Take 1 tablet by mouth daily.      . rosuvastatin (CRESTOR) 5 MG tablet Take 5 mg by mouth at bedtime.        REVIEW OF SYSTEMS:  A 15 point review of systems is documented in the electronic medical record. This was obtained by the nursing staff. However, I reviewed this with the patient to discuss relevant findings and make appropriate changes.  A comprehensive review of systems was negative.   PHYSICAL EXAM:  NAD, A&O Filed Vitals:   02/24/12 1032  BP: 137/83  Pulse: 63  Temp: 98.2 F (36.8 C)   LABORATORY DATA:  Lab Results  Component Value Date   WBC 3.8* 02/07/2012   HGB 14.3 02/07/2012   HCT 42.3 02/07/2012  MCV 83.5 02/07/2012   PLT 179 02/07/2012   Lab Results  Component Value Date   NA 142 02/07/2012   K 4.5 02/07/2012   CL 104 02/07/2012   CO2 26 02/07/2012   Lab Results  Component Value Date   ALT 25 07/20/2011   AST 31 02/07/2012   ALKPHOS 53 02/07/2012   BILITOT 0.80 02/07/2012     RADIOGRAPHY: Dg Facial Bones Complete  01/29/2012  *RADIOLOGY REPORT*  Clinical Data: Right facial injury  FACIAL BONES COMPLETE 3+V  Comparison: None.  Findings: No fluid in the sinuses.  No facial fracture.  IMPRESSION: Negative  Original Report Authenticated By: Thomasenia Sales, M.D.   Ct Chest W Contrast  02/07/2012  *RADIOLOGY REPORT*  Clinical Data:  Rectal cancer diagnosed 01/12 with chemotherapy and radiation therapy completed.  Partial colectomy with reversal. Cholecystectomy and hernia repair.  CT CHEST, ABDOMEN AND PELVIS WITH CONTRAST  Technique: Contiguous axial images of the chest abdomen and pelvis were obtained after IV contrast administration.  Contrast: 100  ml Omnipaque-300  Comparison: 10/08/2011.  CT CHEST  Findings: Lung windows demonstrate 5 mm perifissural right lung nodule on image 33 is unchanged. A 4 mm right middle lobe nodule on image 36 is also similar.  2 mm calcified granuloma left upper lobe.  No new or enlarging nodules.  Soft tissue windows demonstrate no supraclavicular adenopathy. Right-sided Port-A-Cath which terminates at the low SVC. Normal heart size without pericardial or pleural effusion.  No central pulmonary embolism, on this non-dedicated study.  Small stable middle mediastinal nodes.  Right hilar nodal tissue is within normal limits of 1.0 cm, and is unchanged.  Anterior mediastinal thymic tissue is mildly prominent but similar.  IMPRESSION:  1. No acute process or evidence of metastatic disease in the chest. 2.  Stable small pulmonary nodules, including the right middle lobe nodule described on the prior exam.  CT ABDOMEN AND PELVIS  Findings:  Moderate hepatic steatosis.  No focal liver lesion.  A splenule.  Normal stomach, pancreas, right adrenal gland.  Mild left adrenal nodularity. Cholecystectomy without biliary ductal dilatation.  Interpolar right renal calculus.  Normal left kidney. A precaval node measures 1.0 x 1.4 cm on image 83 versus 0.8 x 1.2 cm on the prior.  Just cephalad to this, as an aortocaval node which measures 1.2 cm on image 79 versus 1.0 cm on the prior exam.  Fascial thickening within the perirectal space is improved and likely treatment related. Normal terminal ileum and appendix. Small bowel loops are normal  in caliber.  There are probable adhesions, with small bowel loops immediately deep to the ostomy site on image 92. No ascites.  No evidence of omental or peritoneal disease.  Age advanced aortic and branch vessel atherosclerosis.  No pelvic sidewall adenopathy.  Urinary bladder is decompressed.  Normal prostate, without significant free pelvic fluid.  Prior right lower quadrant ostomy reversal.  No acute osseous abnormality.  A disc bulge at L3-L4 is moderate.  IMPRESSION:  1.  Developing retroperitoneal adenopathy, highly suspicious for nodal metastasis. 2.  Hepatic steatosis, without evidence of hepatic metastasis. 3.  Right renal calculus.  Original Report Authenticated By: Consuello Bossier, M.D.   Ct Abdomen Pelvis W Contrast  02/07/2012  *RADIOLOGY REPORT*  Clinical Data:  Rectal cancer diagnosed 01/12 with chemotherapy and radiation therapy completed.  Partial colectomy with reversal. Cholecystectomy and hernia repair.  CT CHEST, ABDOMEN AND PELVIS WITH CONTRAST  Technique: Contiguous axial images of the chest  abdomen and pelvis were obtained after IV contrast administration.  Contrast: 100  ml Omnipaque-300  Comparison: 10/08/2011.  CT CHEST  Findings: Lung windows demonstrate 5 mm perifissural right lung nodule on image 33 is unchanged. A 4 mm right middle lobe nodule on image 36 is also similar.  2 mm calcified granuloma left upper lobe.  No new or enlarging nodules.  Soft tissue windows demonstrate no supraclavicular adenopathy. Right-sided Port-A-Cath which terminates at the low SVC. Normal heart size without pericardial or pleural effusion.  No central pulmonary embolism, on this non-dedicated study.  Small stable middle mediastinal nodes.  Right hilar nodal tissue is within normal limits of 1.0 cm, and is unchanged.  Anterior mediastinal thymic tissue is mildly prominent but similar.  IMPRESSION:  1. No acute process or evidence of metastatic disease in the chest. 2.  Stable small pulmonary nodules,  including the right middle lobe nodule described on the prior exam.  CT ABDOMEN AND PELVIS  Findings:  Moderate hepatic steatosis.  No focal liver lesion.  A splenule.  Normal stomach, pancreas, right adrenal gland.  Mild left adrenal nodularity. Cholecystectomy without biliary ductal dilatation.  Interpolar right renal calculus.  Normal left kidney. A precaval node measures 1.0 x 1.4 cm on image 83 versus 0.8 x 1.2 cm on the prior.  Just cephalad to this, as an aortocaval node which measures 1.2 cm on image 79 versus 1.0 cm on the prior exam.  Fascial thickening within the perirectal space is improved and likely treatment related. Normal terminal ileum and appendix. Small bowel loops are normal in caliber.  There are probable adhesions, with small bowel loops immediately deep to the ostomy site on image 92. No ascites.  No evidence of omental or peritoneal disease.  Age advanced aortic and branch vessel atherosclerosis.  No pelvic sidewall adenopathy.  Urinary bladder is decompressed.  Normal prostate, without significant free pelvic fluid.  Prior right lower quadrant ostomy reversal.  No acute osseous abnormality.  A disc bulge at L3-L4 is moderate.  IMPRESSION:  1.  Developing retroperitoneal adenopathy, highly suspicious for nodal metastasis. 2.  Hepatic steatosis, without evidence of hepatic metastasis. 3.  Right renal calculus.  Original Report Authenticated By: Consuello Bossier, M.D.   Nm Pet Image Restag (ps) Skull Base To Thigh  02/13/2012  *RADIOLOGY REPORT*  Clinical Data: Subsequent treatment strategy for rectal carcinoma.  NUCLEAR MEDICINE PET SKULL BASE TO THIGH  Fasting Blood Glucose:  99  Technique:  15.6 mCi F-18 FDG was injected intravenously. CT data was obtained and used for attenuation correction and anatomic localization only.  (This was not acquired as a diagnostic CT examination.) Additional exam technical data entered on technologist worksheet.  Comparison:  04/03/2011  Findings:  Neck: No  hypermetabolic lymph nodes in the neck.  Chest:  No hypermetabolic mediastinal or hilar nodes.  No suspicious pulmonary nodules on the CT scan.  Abdomen/Pelvis:  Retroperitoneal lymphadenopathy in the aortocaval space is increased in size now measuring 11 mm in maximum diameter compared to 7 mm, and shows hypermetabolic activity with maximum SUV measuring 5.4.  Increased ill-defined soft tissue fullness is also seen at the anorectal junction which shows new hypermetabolic activity, with maximum SUV measuring 5.1.  [place]:  No focal hypermetabolic activity to suggest skeletal metastasis.  IMPRESSION:  1.  Increased hypermetabolic retroperitoneal lymphadenopathy in the aortocaval space, consistent with metastatic disease. 2.  Increased hypermetabolic soft tissue fullness of the anorectal junction.  Local recurrence of rectal carcinoma cannot be  excluded; suggest correlation with rectal exam.  Original Report Authenticated By: Danae Orleans, M.D.      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  He appears to be amenable for localized salvage therapy.  His options include surgical lymph node dissection, concurrent chemoradiotherapy (3D conformal or IMRT), or stereotactic body radiotherapy.  PLAN:Today, I reviewed the recent findings with [name] and discussed the options.  We spent time reviewing the pros and cons of surgery vs. Radiation and discussed the various forms of radiation.  At this time, we are likely to proceed with concurrent chemoradiotherapy with salvage curative intent.  The required radiation dose in this setting is likely in the 60-70 Gy range, depending on the fractionation, and the location of the lymph nodes between the kidneys, posterior to the small bowel, and anterior to the spinal cord necessitate the use of image-guided intensity modulated radiotherapy.    He has been tentatively set-up for  radiation planning simulation on Friday 8/23 at 8 am with IV Contrast.  I spent 60 minutes minutes face to face with the patient and more than 50% of that time was spent in counseling and/or coordination of care.   ------------------------------------------------  Artist Pais. Kathrynn Running, M.D.

## 2012-02-24 NOTE — Progress Notes (Signed)
Please see the Nurse Progress Note in the MD Initial Consult Encounter for this patient. 

## 2012-02-24 NOTE — Addendum Note (Signed)
Encounter addended by: Tessa Lerner, RN on: 02/24/2012  3:41 PM<BR>     Documentation filed: Charges VN

## 2012-02-24 NOTE — Progress Notes (Signed)
Reference -  ASTRO 2010:  2204 Curative Chemoradiotherapy for Isolated Retroperitoneal Lymph Node Recurrence of Colorectal Cancer  S. Yeo1,2, D. Kim1, T. Kim1, K. Jung1,3, Y. Hong1,3, S. Kim1, J. Park1, H. Choi1, J. Oh1, 1[facility], [place], [place], [address] [facility], [place], [place], [address], [place], [place]  Purpose/Objective(s): Isolated retroperitoneal lymph node recurrence of colorectal cancer may be in an oligometastatic state, making it a candidate for aggressive local treatment with curative-intent. However, resectability has been limited and a surgery was associated with considerable morbidities. We investigated the efficacy of curative chemoradiotherapy for isolated retroperitoneal lymph node recurrence of colorectal cancer.  Materials/Methods: Twenty-two colorectal cancer patients who received three-dimensional conformal radiotherapy (n = 20) or helical tomotherapy (n = 2) between October 2002 and August 2007 for isolated retroperitoneal (para-aortic and common iliac region) lymph node recurrence were analyzed retrospectively. Positron emission tomography-computed tomography was used in confirming isolated recurrence. The median age of the patients was 54 years (range, 27-70), and 50% were males. Radiation dose was 63 Gy in 35 fractions (n = 12) or 55.8 Gy in 31 fractions (n = 8), and 60 Gy in 20 fractions (n = 2) by helical tomotherapy. All patients received concurrent chemotherapy and 16 (72.7%) received adjuvant chemotherapy. Survival was estimated from radiotherapy commencement using the Kaplan-Meier method and the Cox proportional hazard model was used in multivariate analysis.   Results: The treatment response was complete in 13 (59.1%), partial in 6 (27.3%), and stable in 3 (13.6%) patients. Median follow-up for 11 (50%) surviving patients was 32 months (range, 27-61). The 3- and 5-year overall survival  rates were 64.7% and 36.4%, and median overall survival was 41 months. Recurrences developed in 15 (68.2%) patients; outside the retroperitoneum in 13. The 3- and 5-year recurrence-free survival rates were 34.1% and 25.6%, and median recurrence-free survival was 20 months. Response and adjuvant chemotherapy were independent prognostic factors for overall survival. Grade 1 or 2 gastrointestinal toxicities occurred in 18 (81.8%) patients. Gastrointestinal toxicity ? Grade 3 was not observed during follow-up.  Conclusions: Definitive chemoradiotherapy is an effective salvage treatment for isolated retroperitoneal lymph node recurrence of colorectal cancer, yielding outcomes comparable to those of surgical resection but without severe complications. Continued investigation of novel radiotherapy techniques as well as chemotherapy is recommended to improve the response and increase the survival of the patients.

## 2012-02-24 NOTE — Progress Notes (Signed)
Patient here for re consultation of recurrent rectal cancer in the aortacaval lymph node.No other areas of metastasis.Patient has had about a 60% improvement of bowel since having sacral stimulator implant.s/p external beam radiation and 5-fu October 9 thru Nov. 13, 2012.Patient questions whether to have radiation again as states radiation"undone all that surgeon had done in surgery.

## 2012-02-26 ENCOUNTER — Encounter: Payer: Self-pay | Admitting: Hematology and Oncology

## 2012-02-26 ENCOUNTER — Encounter: Payer: Self-pay | Admitting: Oncology

## 2012-02-26 NOTE — Progress Notes (Signed)
Tried to contact patient concerning his xeloda.  The number we have in the system for him is not correct; unable to inform him to contact [facility] (not [facility] as previously noted).

## 2012-02-26 NOTE — Progress Notes (Signed)
Called [facility] [phone] about xeloda shipment.  They have tried to contact the patient but he has not returned their calls.  He needs to call them at [phone]; informed pt.

## 2012-02-27 ENCOUNTER — Telehealth: Payer: Self-pay

## 2012-02-27 NOTE — Telephone Encounter (Signed)
Attempted to call patient without  success to remind to apply emla cream to power port in the morning prior to ct simulation.Answering service states [phone] isn't a working number.

## 2012-02-28 ENCOUNTER — Encounter: Payer: Self-pay | Admitting: Radiation Oncology

## 2012-02-28 ENCOUNTER — Ambulatory Visit
Admission: RE | Admit: 2012-02-28 | Discharge: 2012-02-28 | Disposition: A | Discharge status: Home / Self Care | Payer: BC Managed Care – PPO | Source: Ambulatory Visit | Attending: Radiation Oncology | Admitting: Radiation Oncology

## 2012-02-28 DIAGNOSIS — C20 Malignant neoplasm of rectum: Secondary | ICD-10-CM

## 2012-02-28 DIAGNOSIS — C772 Secondary and unspecified malignant neoplasm of intra-abdominal lymph nodes: Secondary | ICD-10-CM

## 2012-02-28 NOTE — Progress Notes (Signed)
  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  Identity was confirmed.  All relevant records and images related to the planned course of therapy were reviewed.  The patient freely provided informed written consent to proceed with treatment after reviewing the details related to the planned course of therapy. The consent form was witnessed and verified by the simulation staff.  Then, the patient was set-up in a stable reproducible  supine position for radiation therapy on the wing board.  CT images were obtained.  Surface markings were placed.  The CT images were loaded into the planning software.  Then the target and avoidance structures were contoured.  Treatment planning then occurred.  The radiation prescription was entered and confirmed.  A total of zero complex treatment devices were fabricated. I have requested : Intensity Modulated Radiotherapy (IMRT) is medically necessary for this case for the following reason:  Small bowel sparing..  I have ordered:Nutrition Consult  PLAN:  The patient will receive 60 Gy in 20 fraction.  ________________________________  Artist Pais Kathrynn Running, M.D.

## 2012-02-28 NOTE — Addendum Note (Signed)
Encounter addended by: Delynn Flavin, RN on: 02/28/2012  7:09 PM<BR>     Documentation filed: Charges VN

## 2012-03-02 ENCOUNTER — Telehealth: Payer: Self-pay | Admitting: *Deleted

## 2012-03-02 NOTE — Telephone Encounter (Signed)
Spoke with [facility] pharmacy and was informed that pt received Xeloda on Sat. 02/29/12.    Spoke with pt at home and was informed of same info.   Pt stated his radiation will not start until  03/23/12  -  Due to pt will be going on vacation from 9/6 through  9/13.   Instructed pt that nurse will call pt back on 03/03/12 with md's instructions  When to start  Xeloda.   Pt voiced understanding. Pt's   Phone    [phone].

## 2012-03-03 ENCOUNTER — Telehealth: Payer: Self-pay

## 2012-03-03 ENCOUNTER — Other Ambulatory Visit: Payer: Self-pay

## 2012-03-03 DIAGNOSIS — C20 Malignant neoplasm of rectum: Secondary | ICD-10-CM

## 2012-03-03 NOTE — Telephone Encounter (Signed)
No answer

## 2012-03-04 ENCOUNTER — Other Ambulatory Visit: Payer: Self-pay | Admitting: *Deleted

## 2012-03-04 ENCOUNTER — Telehealth: Payer: Self-pay | Admitting: *Deleted

## 2012-03-04 NOTE — Telephone Encounter (Signed)
Spoke with pt today.  Informed pt re:  Per Dr. Dalene Carrow,  NOT to start  Xeloda yet.  Informed pt that a scheduler will contact pt with appts on 03/23/12 with NP.   Pt voiced understanding.

## 2012-03-06 ENCOUNTER — Other Ambulatory Visit: Payer: Self-pay | Admitting: *Deleted

## 2012-03-06 ENCOUNTER — Telehealth: Payer: Self-pay | Admitting: Hematology and Oncology

## 2012-03-06 NOTE — Telephone Encounter (Signed)
S/w pt re new appt for 9/17 @ 8 am lb and LO @ 9 am. Pt has xrt @ 8:20 am.

## 2012-03-11 ENCOUNTER — Encounter: Payer: Self-pay | Admitting: Radiation Oncology

## 2012-03-17 ENCOUNTER — Telehealth: Payer: Self-pay | Admitting: Radiation Oncology

## 2012-03-17 NOTE — Telephone Encounter (Signed)
Per Dr. Broadus John order this writer left a message on Dr. Lonell Face nurses answering machine and emailed Dr. Dalene Carrow with the information that this patient is to receive her initial radiation treatment on 03/23/2012.

## 2012-03-18 ENCOUNTER — Telehealth: Payer: Self-pay | Admitting: Nurse Practitioner

## 2012-03-18 NOTE — Telephone Encounter (Signed)
Received call from Lelon Mast, RN at Dr. Broadus John office.  Pt will begin radiation treament on Monday 9/16.

## 2012-03-19 ENCOUNTER — Telehealth: Payer: Self-pay | Admitting: Nurse Practitioner

## 2012-03-19 ENCOUNTER — Other Ambulatory Visit: Payer: Self-pay | Admitting: Nurse Practitioner

## 2012-03-19 NOTE — Telephone Encounter (Signed)
Instructed patient to begin Xeloda on Monday 9/16, first day of radiation.  Pt verbalized understanding.

## 2012-03-20 ENCOUNTER — Other Ambulatory Visit: Payer: BC Managed Care – PPO | Admitting: Lab

## 2012-03-23 ENCOUNTER — Ambulatory Visit
Admission: RE | Admit: 2012-03-23 | Discharge: 2012-03-23 | Disposition: A | Discharge status: Home / Self Care | Payer: BC Managed Care – PPO | Source: Ambulatory Visit | Attending: Radiation Oncology | Admitting: Radiation Oncology

## 2012-03-23 ENCOUNTER — Ambulatory Visit: Payer: BC Managed Care – PPO | Admitting: Family

## 2012-03-24 ENCOUNTER — Other Ambulatory Visit: Payer: Self-pay | Admitting: *Deleted

## 2012-03-24 ENCOUNTER — Telehealth: Payer: Self-pay | Admitting: Hematology and Oncology

## 2012-03-24 ENCOUNTER — Encounter: Payer: Self-pay | Admitting: Hematology and Oncology

## 2012-03-24 ENCOUNTER — Ambulatory Visit
Admission: RE | Admit: 2012-03-24 | Discharge: 2012-03-24 | Disposition: A | Discharge status: Home / Self Care | Payer: BC Managed Care – PPO | Source: Ambulatory Visit | Attending: Radiation Oncology | Admitting: Radiation Oncology

## 2012-03-24 ENCOUNTER — Ambulatory Visit (HOSPITAL_BASED_OUTPATIENT_CLINIC_OR_DEPARTMENT_OTHER): Payer: BC Managed Care – PPO | Admitting: Hematology and Oncology

## 2012-03-24 ENCOUNTER — Other Ambulatory Visit (HOSPITAL_BASED_OUTPATIENT_CLINIC_OR_DEPARTMENT_OTHER): Payer: BC Managed Care – PPO | Admitting: Lab

## 2012-03-24 VITALS — BP 152/86 | HR 67 | Temp 97.2°F | Resp 20 | Ht 71.0 in | Wt 206.5 lb

## 2012-03-24 DIAGNOSIS — C774 Secondary and unspecified malignant neoplasm of inguinal and lower limb lymph nodes: Secondary | ICD-10-CM

## 2012-03-24 DIAGNOSIS — C20 Malignant neoplasm of rectum: Secondary | ICD-10-CM

## 2012-03-24 LAB — COMPREHENSIVE METABOLIC PANEL (CC13)
ALT: 35 U/L (ref 0–55)
Alkaline Phosphatase: 64 U/L (ref 40–150)
Sodium: 140 mEq/L (ref 136–145)
Total Bilirubin: 0.9 mg/dL (ref 0.20–1.20)
Total Protein: 7 g/dL (ref 6.4–8.3)

## 2012-03-24 LAB — CBC WITH DIFFERENTIAL/PLATELET
BASO%: 0.4 % (ref 0.0–2.0)
LYMPH%: 15.8 % (ref 14.0–49.0)
MCHC: 34.5 g/dL (ref 32.0–36.0)
MCV: 83.2 fL (ref 79.3–98.0)
MONO#: 0.5 10*3/uL (ref 0.1–0.9)
MONO%: 8.5 % (ref 0.0–14.0)
Platelets: 147 10*3/uL (ref 140–400)
RBC: 5.53 10*6/uL (ref 4.20–5.82)
WBC: 5.4 10*3/uL (ref 4.0–10.3)

## 2012-03-24 MED ORDER — PROCHLORPERAZINE MALEATE 10 MG PO TABS: 10.0000 mg | ORAL_TABLET | Freq: Four times a day (QID) | ORAL | Status: DC | PRN

## 2012-03-24 MED ORDER — ONDANSETRON HCL 8 MG PO TABS: 8.0000 mg | ORAL_TABLET | Freq: Two times a day (BID) | ORAL | Status: DC | PRN

## 2012-03-24 MED ORDER — ONDANSETRON 8 MG PO TBDP: 8.0000 mg | ORAL_TABLET | Freq: Three times a day (TID) | ORAL | Status: DC | PRN

## 2012-03-24 NOTE — Progress Notes (Signed)
This office note has been dictated.

## 2012-03-24 NOTE — Telephone Encounter (Signed)
appts made and printed for pt,pt needs closer times together ie appt now on 10/2 not 10/1 as 9;00 was booked       aom

## 2012-03-24 NOTE — Progress Notes (Signed)
CC:   [name], M.D. [name], M.D. [name], M.D. Eric Halsted, MD  IDENTIFYING STATEMENT:  The patient is a 46 year old man with rectal cancer who presents for followup.  INTERVAL HISTORY:  [name] has reconsulted with Dr. Margaretmary Bayley for his isolated recurrent lymph node metastasis from rectal cancer.  He has received and begun Xeloda concomitantly with radiation planning to treat him with 60 Gy in 20 fractions.  The patient notes nausea but no vomiting.  He reports that he has not taken antiemetics.  Otherwise he feels fine but is concerned about his ability to work the nighttime shift whilst he is receiving therapy.  MEDICATIONS:  Xeloda 1000 mg p.o. b.i.d. daily for the duration of radiation.  Rest of medicines reviewed and updated.  ALLERGIES:  Penicillin.  PAST MEDICAL HISTORY/FAMILY HISTORY/SOCIAL HISTORY:  Unchanged.  REVIEW OF SYSTEMS:  10 point review of systems negative.  PHYSICAL EXAM:  General:  The patient is a well-appearing, well- nourished man in no distress.  Vital signs:  Pulse 67, blood pressure 152/86, temperature 97.2, respirations 20, weight 206 pounds.  HEENT: Head is atraumatic, normocephalic.  Sclerae anicteric.  Mouth moist. Neck:  Supple.  Chest:  Clear.  Port:  No signs infection.  CVS: Unremarkable.  Abdomen:  Soft, nontender.  Bowel sounds present. Extremities:  No calf tenderness.  Lymph nodes:  No palpable cervical, axillary adenopathy.  CNS:  Nonfocal.  LAB DATA:  On 03/24/2012 white cell count 5.4, hemoglobin 15.9, hematocrit 46.4, platelets 147, sodium 140, potassium 4.3, chloride 106, CO2 24.  BUN 11, creatinine 0.9, glucose 101, T bilirubin 0.9, alkaline phosphatase 64, AST 24, ALT 35.  IMPRESSION AND PLAN:  [name] is a 46 year old man with recurrent KRAS wild  type rectal cancer isolated to an aortocaval lymph node with no other areas of metastatic disease.  He began localized radiation therapy  with CyberKnife concomitantly with Xeloda (1000 mg p.o. b.i.d. on 03/23/2012).  He already notes nausea.  He was counseled about using antiemetics with Zofran ODT 8 mg b.i.d. with Compazine p.r.n.  He is to keep well hydrated.  He follows up in 2 weeks' time.    ______________________________ Laurice Record, M.D. LIO/MEDQ  D:  03/24/2012  T:  03/24/2012  Job:  [id number]

## 2012-03-24 NOTE — Patient Instructions (Addendum)
[name]  [id number]   [facility] - AFTER VISIT SUMMARY   **RECOMMENDATIONS MADE BY THE CONSULTANT AND ANY TEST    RESULTS WILL BE SENT TO YOUR REFERRING DOCTORS.   YOUR EXAM FINDINGS, LABS AND RESULTS WERE DISCUSSED BY YOUR MD TODAY.    Your Updated drug allergies are: Allergies as of 03/24/2012 - Review Complete 03/24/2012  Allergen Reaction Noted  . Penicillins Itching 07/06/2011    Your current list of medications are: Current Outpatient Prescriptions  Medication Sig Dispense Refill  . capecitabine (XELODA) 500 MG tablet Take 2 tablets (1,000 mg total) by mouth 2 (two) times daily after a meal. Take 2 tabs = 1000 mg po  BID -  To begin with and for the duration of radiation.  168 tablet  0  . dicyclomine (BENTYL) 10 MG capsule Take 10 mg by mouth 3 (three) times daily.       Marland Kitchen GLUCOSAMINE HCL PO Take 1 tablet by mouth daily.      Marland Kitchen oxyCODONE (OXY IR/ROXICODONE) 5 MG immediate release tablet Take 5 mg by mouth every 4 (four) hours as needed. For pain      . Pyridoxine HCl (VITAMIN B-6 PO) Take 1 tablet by mouth daily.      . rosuvastatin (CRESTOR) 5 MG tablet Take 5 mg by mouth at bedtime.      Marland Kitchen loperamide (IMODIUM) 2 MG capsule Take 2 mg by mouth 4 (four) times daily as needed. For diarrhea      . ondansetron (ZOFRAN) 8 MG tablet Take 1 tablet (8 mg total) by mouth every 12 (twelve) hours as needed for nausea.  30 tablet  1     INSTRUCTIONS GIVEN AND DISCUSSED:  See attached schedule   SPECIAL INSTRUCTIONS/FOLLOW-UP:  See above.  I acknowledge that I have been informed and understand all the instructions given to me and received a copy. I do not have any more questions at this time, but understand that I may call the [facility] at [phone] during business hours should I have any further questions or need assistance in obtaining follow-up care.

## 2012-03-25 ENCOUNTER — Ambulatory Visit
Admission: RE | Admit: 2012-03-25 | Discharge: 2012-03-25 | Disposition: A | Discharge status: Home / Self Care | Payer: BC Managed Care – PPO | Source: Ambulatory Visit | Attending: Radiation Oncology | Admitting: Radiation Oncology

## 2012-03-26 ENCOUNTER — Ambulatory Visit
Admission: RE | Admit: 2012-03-26 | Discharge: 2012-03-26 | Disposition: A | Discharge status: Home / Self Care | Payer: BC Managed Care – PPO | Source: Ambulatory Visit | Attending: Radiation Oncology | Admitting: Radiation Oncology

## 2012-03-27 ENCOUNTER — Ambulatory Visit
Admission: RE | Admit: 2012-03-27 | Discharge: 2012-03-27 | Disposition: A | Discharge status: Home / Self Care | Payer: BC Managed Care – PPO | Source: Ambulatory Visit | Attending: Radiation Oncology | Admitting: Radiation Oncology

## 2012-03-27 ENCOUNTER — Encounter: Payer: Self-pay | Admitting: Radiation Oncology

## 2012-03-27 VITALS — BP 139/76 | HR 85 | Temp 98.3°F | Resp 20 | Wt 205.7 lb

## 2012-03-27 DIAGNOSIS — R52 Pain, unspecified: Secondary | ICD-10-CM

## 2012-03-27 DIAGNOSIS — S62339A Displaced fracture of neck of unspecified metacarpal bone, initial encounter for closed fracture: Secondary | ICD-10-CM

## 2012-03-27 DIAGNOSIS — C772 Secondary and unspecified malignant neoplasm of intra-abdominal lymph nodes: Secondary | ICD-10-CM

## 2012-03-27 DIAGNOSIS — C20 Malignant neoplasm of rectum: Secondary | ICD-10-CM

## 2012-03-27 MED ORDER — OXYCODONE HCL 5 MG PO TABS: 5.0000 mg | ORAL_TABLET | ORAL | Status: DC | PRN

## 2012-03-27 NOTE — Progress Notes (Signed)
  Radiation Oncology         [phone] ________________________________  Name: [name] MRN: [id number]  Date: 03/27/2012  DOB: [birth year]  Weekly Radiation Therapy Management  Current Dose: 15 Gy     Planned Dose:  60 Gy  Narrative . . . . . . . . The patient presents for routine under treatment assessment.                                                     The patient is without complaint.                                 Set-up films were reviewed.                                 The chart was checked. Physical Findings. . . Weight essentially stable.  No significant changes. Impression . . . . . . . The patient is  tolerating radiation. Plan . . . . . . . . . . . . Continue treatment as planned.  ________________________________  Artist Pais. Kathrynn Running, M.D.

## 2012-03-27 NOTE — Progress Notes (Signed)
Pt states "ever since radiation Mon. my right side has hurt." States pain 7-8/10, does get relief of pain to 2-3/10 w/ Oxy-IR. He is taking Oxy-IR 5 mg 3x/day, states he has taken more this week than he took all last month. Pt also c/o nausea w/o vomiting,  "diarrhea w/some solid stool", Imodium prn. He states all began after his first radiation tx Mon. Pt states he has not been able to work this week. Taking Zofran q12hr regularly.  Post sim ed completed w/pt and wife. Pt refused "Radiation and You" booklet, states he probably has copy at home from previous radiation tx. All temporary side effects reviewed w/pt, re: nausea, diarrhea, pain, nutrition, fatigue.

## 2012-03-30 ENCOUNTER — Ambulatory Visit
Admission: RE | Admit: 2012-03-30 | Discharge: 2012-03-30 | Disposition: A | Discharge status: Home / Self Care | Payer: BC Managed Care – PPO | Source: Ambulatory Visit | Attending: Radiation Oncology | Admitting: Radiation Oncology

## 2012-03-31 ENCOUNTER — Other Ambulatory Visit (HOSPITAL_BASED_OUTPATIENT_CLINIC_OR_DEPARTMENT_OTHER): Payer: BC Managed Care – PPO | Admitting: Lab

## 2012-03-31 ENCOUNTER — Ambulatory Visit
Admission: RE | Admit: 2012-03-31 | Discharge: 2012-03-31 | Disposition: A | Discharge status: Home / Self Care | Payer: BC Managed Care – PPO | Source: Ambulatory Visit | Attending: Radiation Oncology | Admitting: Radiation Oncology

## 2012-03-31 DIAGNOSIS — C20 Malignant neoplasm of rectum: Secondary | ICD-10-CM

## 2012-03-31 DIAGNOSIS — Z5189 Encounter for other specified aftercare: Secondary | ICD-10-CM

## 2012-03-31 LAB — CBC WITH DIFFERENTIAL/PLATELET
BASO%: 0.3 % (ref 0.0–2.0)
EOS%: 4 % (ref 0.0–7.0)
HCT: 41.3 % (ref 38.4–49.9)
LYMPH%: 17 % (ref 14.0–49.0)
MCH: 28.8 pg (ref 27.2–33.4)
MCHC: 34.5 g/dL (ref 32.0–36.0)
NEUT%: 68.7 % (ref 39.0–75.0)
Platelets: 190 10*3/uL (ref 140–400)

## 2012-04-01 ENCOUNTER — Ambulatory Visit
Admission: RE | Admit: 2012-04-01 | Discharge: 2012-04-01 | Disposition: A | Discharge status: Home / Self Care | Payer: BC Managed Care – PPO | Source: Ambulatory Visit | Attending: Radiation Oncology | Admitting: Radiation Oncology

## 2012-04-02 ENCOUNTER — Ambulatory Visit
Admission: RE | Admit: 2012-04-02 | Discharge: 2012-04-02 | Disposition: A | Discharge status: Home / Self Care | Payer: BC Managed Care – PPO | Source: Ambulatory Visit | Attending: Radiation Oncology | Admitting: Radiation Oncology

## 2012-04-03 ENCOUNTER — Ambulatory Visit: Payer: BC Managed Care – PPO

## 2012-04-06 ENCOUNTER — Encounter: Payer: Self-pay | Admitting: Radiation Oncology

## 2012-04-06 ENCOUNTER — Ambulatory Visit
Admission: RE | Admit: 2012-04-06 | Discharge: 2012-04-06 | Disposition: A | Discharge status: Home / Self Care | Payer: BC Managed Care – PPO | Source: Ambulatory Visit | Attending: Radiation Oncology | Admitting: Radiation Oncology

## 2012-04-06 VITALS — BP 123/78 | HR 78 | Temp 98.2°F | Resp 20 | Wt 207.9 lb

## 2012-04-06 DIAGNOSIS — C772 Secondary and unspecified malignant neoplasm of intra-abdominal lymph nodes: Secondary | ICD-10-CM

## 2012-04-06 NOTE — Progress Notes (Signed)
Pt reports he has multiple soft stools up to #10 q 2-3 days accompanied by abdominal cramps. He states he has urgency w/bm's. He states that "the last bm is always diarrhea". Pt takes Imodium prn. Pt only taking Oxy-IR 1-2 tabs day for abd pain w/good relief. Takes Zofran qam but experiences nausea 1 hr after radiation tx daily, resolves w/i 3-4 hrs.  Pt in for PUT visit, was not tx nor seen Fri.

## 2012-04-06 NOTE — Progress Notes (Signed)
  Radiation Oncology         [phone] ________________________________  Name: [name] MRN: [id number]  Date: 04/06/2012  DOB: [birth year]  Weekly Radiation Therapy Management  Current Dose: 30 Gy     Planned Dose:  60 Gy  Narrative . . . . . . . . The patient presents for routine under treatment assessment.           Pt reports he has multiple soft stools up to #10 q 2-3 days accompanied by abdominal cramps. He states he has urgency w/bm's. He states that "the last bm is always diarrhea". Pt takes Imodium prn. Pt only taking Oxy-IR 1-2 tabs day for abd pain w/good relief. Takes Zofran qam but experiences nausea 1 hr after radiation tx daily, resolves w/i 3-4 hrs.  Pt in for PUT visit, was not tx nor seen Fri.                                     The patient is without complaint.                                 Set-up films were reviewed.                                 The chart was checked. Physical Findings. Ceasar Mons Weights   04/06/12 0853  Weight: 207 lb 14.4 oz (94.303 kg)   Filed Vitals:   04/06/12 0855  BP: 123/78  Pulse: 78  Temp:   Resp:    .  No significant changes. Impression . . . . . . . The patient is  tolerating radiation. Plan . . . . . . . . . . . . Continue treatment as planned.  ________________________________  Artist Pais. Kathrynn Running, M.D.

## 2012-04-07 ENCOUNTER — Ambulatory Visit
Admission: RE | Admit: 2012-04-07 | Discharge: 2012-04-07 | Disposition: A | Discharge status: Home / Self Care | Payer: BC Managed Care – PPO | Source: Ambulatory Visit | Attending: Radiation Oncology | Admitting: Radiation Oncology

## 2012-04-07 ENCOUNTER — Encounter: Payer: Self-pay | Admitting: Hematology and Oncology

## 2012-04-07 NOTE — Progress Notes (Signed)
Put disability and fmla forms on nurse's desk. °

## 2012-04-08 ENCOUNTER — Telehealth: Payer: Self-pay | Admitting: Hematology and Oncology

## 2012-04-08 ENCOUNTER — Ambulatory Visit (HOSPITAL_BASED_OUTPATIENT_CLINIC_OR_DEPARTMENT_OTHER): Payer: BC Managed Care – PPO | Admitting: Family

## 2012-04-08 ENCOUNTER — Encounter: Payer: Self-pay | Admitting: Radiation Oncology

## 2012-04-08 ENCOUNTER — Other Ambulatory Visit (HOSPITAL_BASED_OUTPATIENT_CLINIC_OR_DEPARTMENT_OTHER): Payer: BC Managed Care – PPO | Admitting: Lab

## 2012-04-08 ENCOUNTER — Encounter: Payer: Self-pay | Admitting: Family

## 2012-04-08 ENCOUNTER — Encounter: Payer: Self-pay | Admitting: Hematology and Oncology

## 2012-04-08 ENCOUNTER — Ambulatory Visit
Admission: RE | Admit: 2012-04-08 | Discharge: 2012-04-08 | Disposition: A | Discharge status: Home / Self Care | Payer: BC Managed Care – PPO | Source: Ambulatory Visit | Attending: Radiation Oncology | Admitting: Radiation Oncology

## 2012-04-08 ENCOUNTER — Ambulatory Visit (HOSPITAL_BASED_OUTPATIENT_CLINIC_OR_DEPARTMENT_OTHER): Payer: BC Managed Care – PPO

## 2012-04-08 VITALS — BP 125/79 | HR 62 | Temp 97.8°F | Resp 20 | Ht 71.0 in | Wt 206.4 lb

## 2012-04-08 DIAGNOSIS — C20 Malignant neoplasm of rectum: Secondary | ICD-10-CM

## 2012-04-08 DIAGNOSIS — Z452 Encounter for adjustment and management of vascular access device: Secondary | ICD-10-CM

## 2012-04-08 LAB — CBC WITH DIFFERENTIAL/PLATELET
BASO%: 0.6 % (ref 0.0–2.0)
EOS%: 3.7 % (ref 0.0–7.0)
HCT: 42.8 % (ref 38.4–49.9)
MCH: 28.7 pg (ref 27.2–33.4)
MCHC: 34.4 g/dL (ref 32.0–36.0)
NEUT%: 68.8 % (ref 39.0–75.0)
RDW: 13.5 % (ref 11.0–14.6)
lymph#: 0.5 10*3/uL — ABNORMAL LOW (ref 0.9–3.3)

## 2012-04-08 IMAGING — CT CT ABD-PELV W/ CM
2 of 5 series · 15 of 46 positions shown, 17 images · IV contrast (omnipaque)
Comparison: CT of the chest, abdomen and pelvis performed
06/18/2011

CLINICAL DATA: Fever; postoperative, status post ileostomy reversal
on 06/28/2011.  Assess for intra-abdominal abscess.  History of
colorectal cancer, status post proctectomy, chemotherapy and
radiation therapy.

CT ABDOMEN AND PELVIS WITH CONTRAST
TECHNIQUE: Multidetector CT imaging of the abdomen and pelvis was
performed following the standard protocol during bolus
administration of intravenous contrast.
Contrast: 100mL OMNIPAQUE IOHEXOL 300 MG/ML IV SOLN

[Series 2: abd/pelvis 5.0 b31f · axial · 0.74mm/px · z∈[+683,+1143]mm · 12 of 102 slices shown, 14 images]
[im 5/102  soft-tissue]
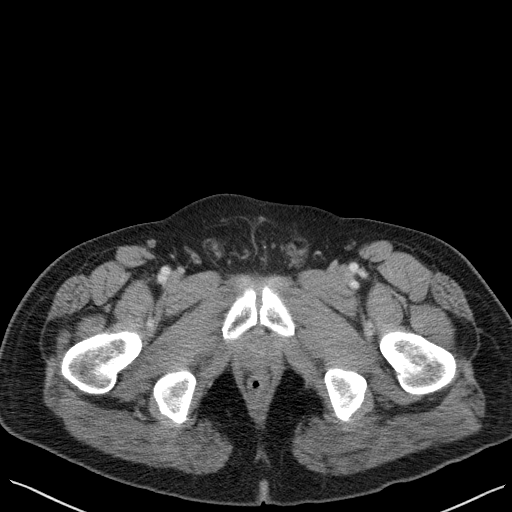
[im 5/102  bone]
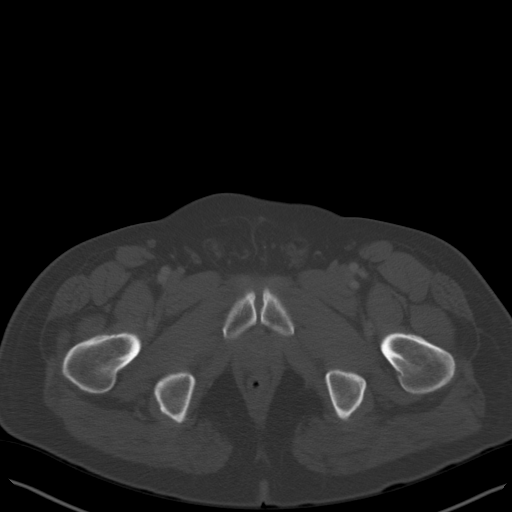
[im 15/102  soft-tissue]
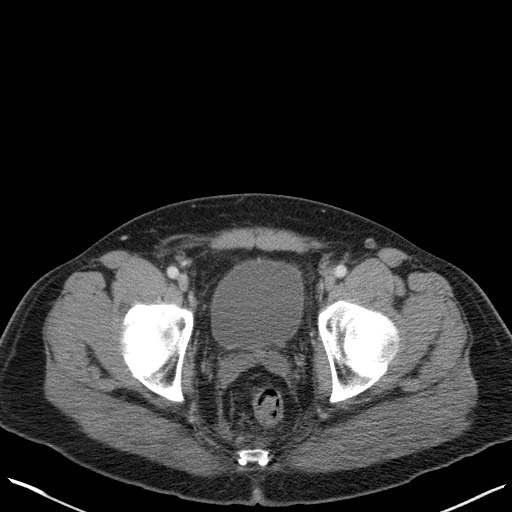
[im 25/102  soft-tissue]
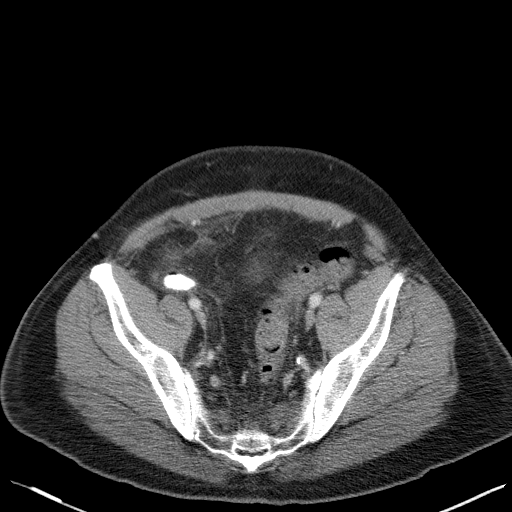
[im 29/102  soft-tissue]
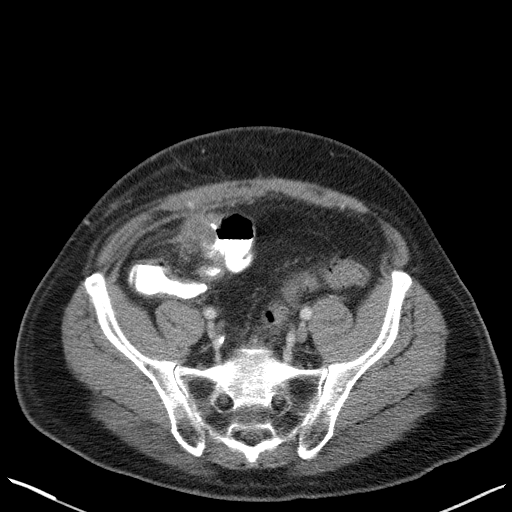
[im 39/102  soft-tissue]
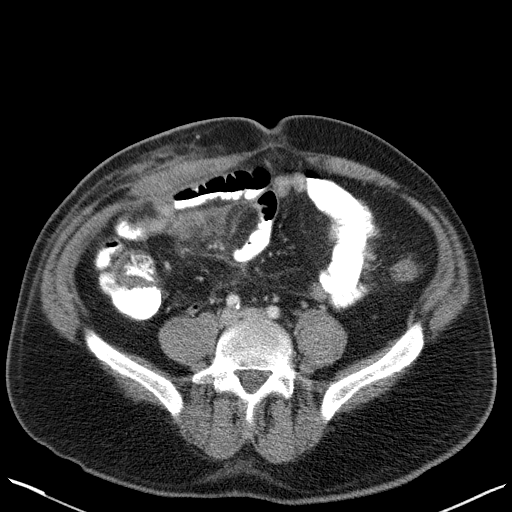
[im 49/102  soft-tissue]
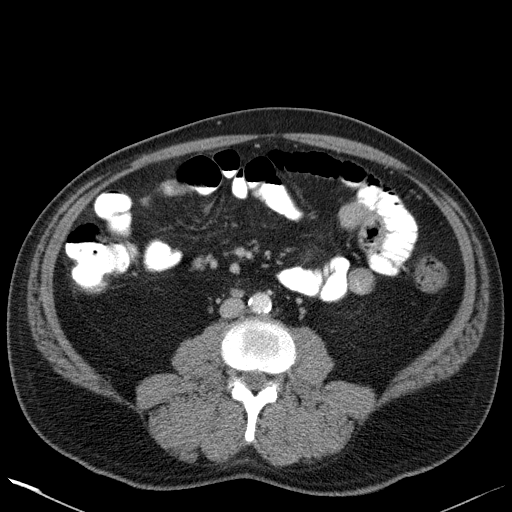
[im 53/102  soft-tissue]
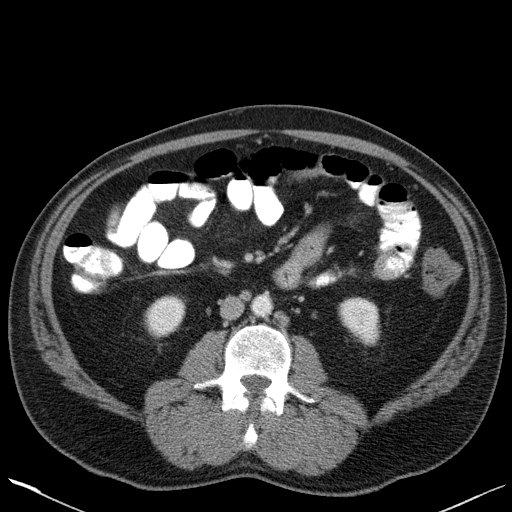
[im 63/102  soft-tissue]
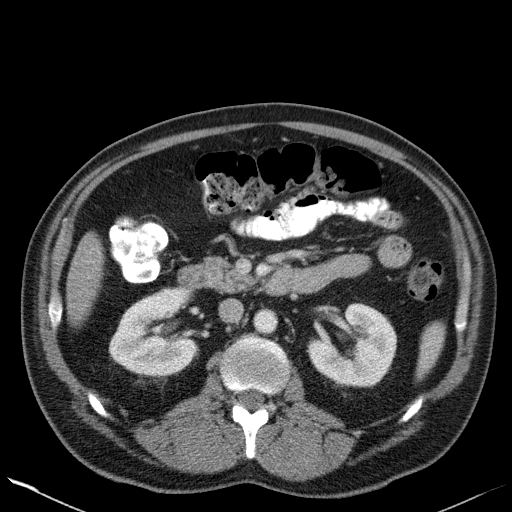
[im 73/102  soft-tissue]
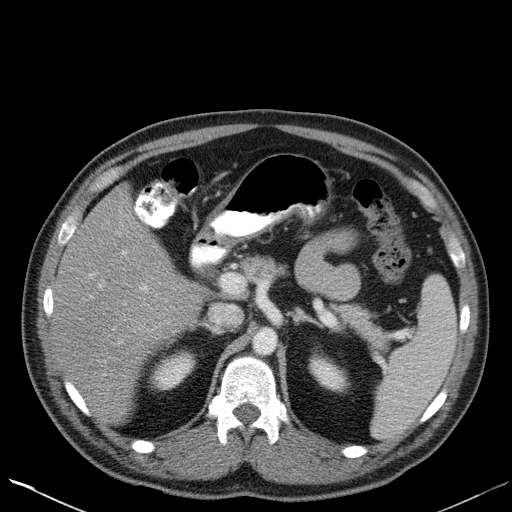
[im 73/102  bone]
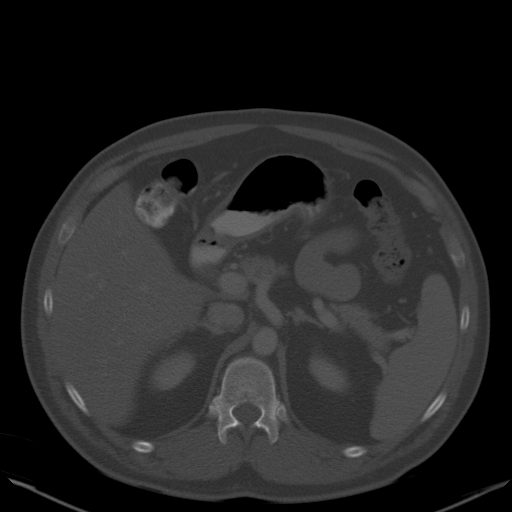
[im 77/102  soft-tissue]
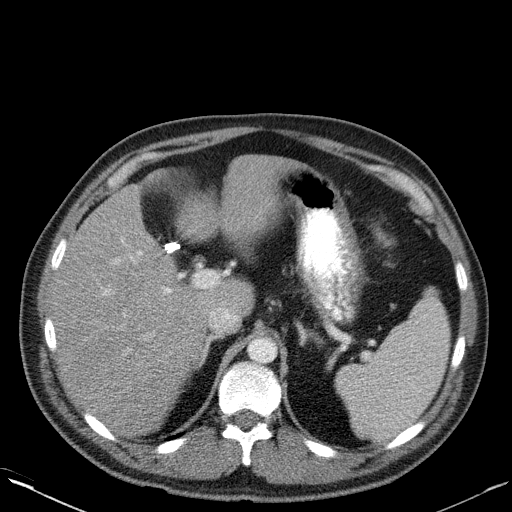
[im 87/102  soft-tissue]
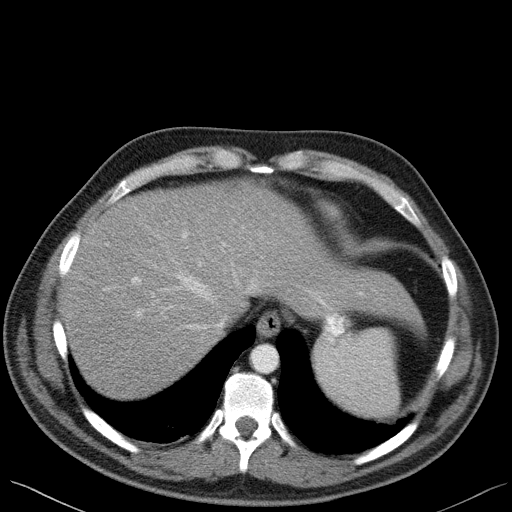
[im 97/102  soft-tissue]
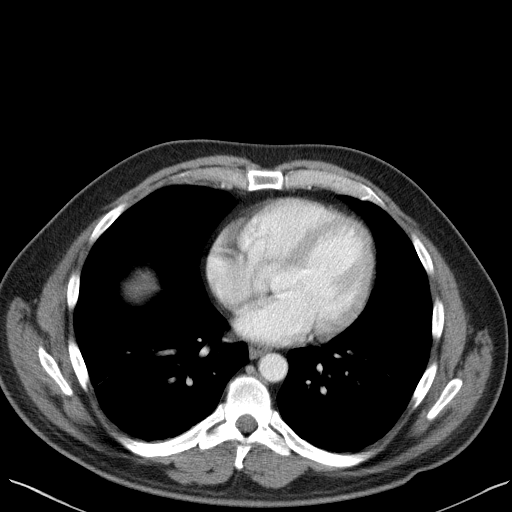

[Series 5: abd/pelvis 3.0 coronal · coronal · 0.84mm/px · 3 of 92 slices shown]
[im 31/92  soft-tissue]
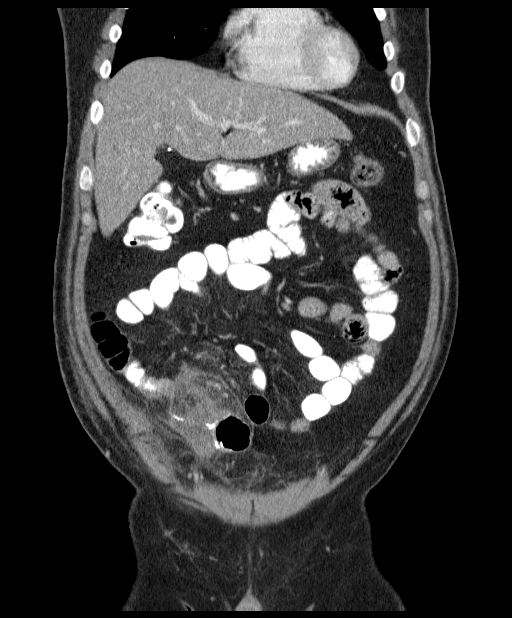
[im 41/92  soft-tissue]
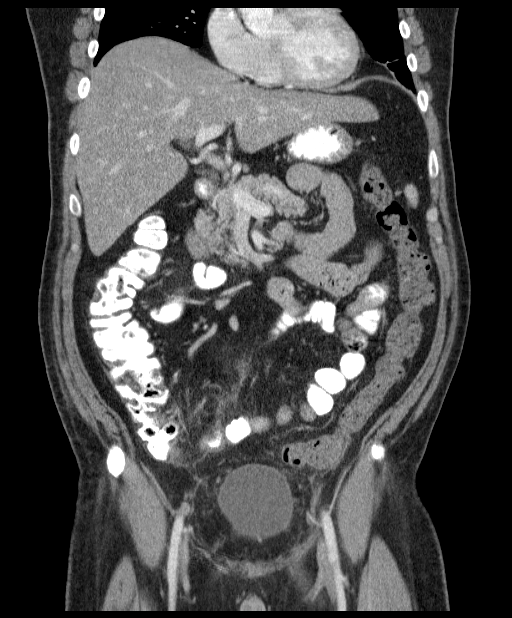
[im 51/92  soft-tissue]
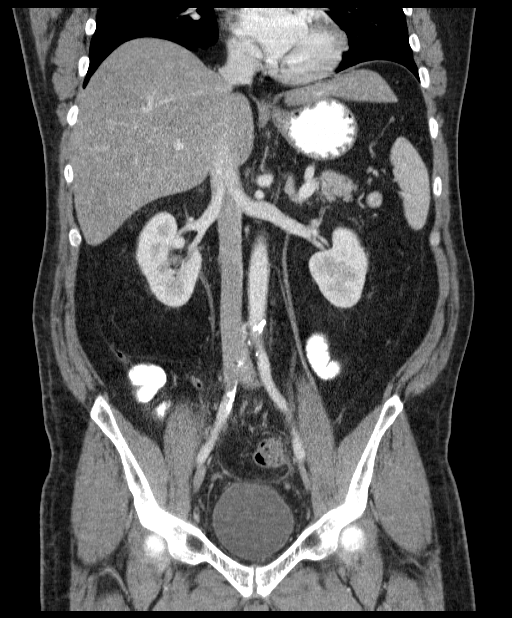

[15 of 46 positions shown; findings below may reference images not displayed]

FINDINGS: Minimal bibasilar atelectasis is noted.

The liver and spleen are unremarkable in appearance.  The patient
is status post cholecystectomy, with clips noted at the gallbladder
fossa.  The pancreas and adrenal glands are unremarkable.

There is a 2 mm stone noted near the upper pole of the right
kidney.  The kidneys are otherwise unremarkable in appearance.
There is no evidence of hydronephrosis.  No obstructing ureteral
stones are seen.  Mild nonspecific perinephric stranding is again
noted bilaterally.

At the site of the patient's ileostomy reversal at the right lower
quadrant, there is a large soft tissue phlegmon and focal ileal
wall thickening.  Phlegmon and fluid tracks superiorly along the
mesentery, without definite intra-abdominal abscess.  There is no
evidence of obstruction; contrast progresses through the region of
inflammation.  There is also mild inflammation of adjacent small
bowel loops.

There is mild peripheral enhancement about a small collection of
fluid along the anterior abdominal wall of the right lower
quadrant, measuring 4.9 x 2.0 x 1.7 cm.  This raises concern for a
focal soft tissue abscess, though a postoperative seroma could have
a similar appearance.  Surrounding soft tissue inflammation is
seen.

The more proximal small bowel is unremarkable in appearance.  The
stomach is within normal limits.  No acute vascular abnormalities
are seen.  Scattered calcification is noted along the distal
abdominal aorta and its branches.

The appendix filled with air and is grossly unremarkable in
appearance; minimal associated soft tissue stranding appears to be
reactive secondary to the right lower quadrant process.  Mild soft
tissue stranding about the sigmoid colon and rectum likely reflects
prior radiation therapy.  The colon is otherwise unremarkable in
appearance.

The bladder is mildly distended and grossly unremarkable
appearance.  The prostate remains normal in size.  No inguinal
lymphadenopathy is seen.

No acute osseous abnormalities are identified.
IMPRESSION: 1.  Mild peripheral enhancement about a small collection of fluid
at the anterior abdominal wall along the right lower quadrant,
measuring 4.9 x 2.0 x 1.7 cm at the site of ileostomy reversal.
This raises concern for a small focal soft tissue abscess, though a
postoperative seroma could have a similar appearance.
2.  Large soft tissue phlegmon and focal ileal wall thickening at
the site of ileostomy reversal, with phlegmon and fluid tracking
superiorly along the mesentery, but no evidence of intra-abdominal
abscess.  No evidence of obstruction; contrast passes across the
region of inflammation.
3.  Mild inflammation of the adjacent small bowel loops noted.
4.  Scattered calcification along the distal abdominal aorta and
its branches.
5.  Mild soft tissue inflammation about the sigmoid colon and
rectum likely reflects prior radiation therapy.
6.  2 mm nonobstructing stone noted near the upper pole of the
right kidney.

## 2012-04-08 MED ORDER — HEPARIN SOD (PORK) LOCK FLUSH 100 UNIT/ML IV SOLN
500.0000 [IU] | Freq: Once | INTRAVENOUS | Status: AC
  Administered 2012-04-08: 500 [IU] via INTRAVENOUS
  Filled 2012-04-08: qty 5

## 2012-04-08 MED ORDER — SODIUM CHLORIDE 0.9 % IJ SOLN
10.0000 mL | INTRAMUSCULAR | Status: DC | PRN
  Administered 2012-04-08: 10 mL via INTRAVENOUS
  Filled 2012-04-08: qty 10

## 2012-04-08 NOTE — Progress Notes (Signed)
Faxed disability form to [facility] @ [phone], faxed fmla form to [employer] @ [phone]; put both in registration desk for patient to pick up.

## 2012-04-08 NOTE — Telephone Encounter (Signed)
Gave pt appt for October 2013 lab before MD visit and flush appt q 8 weeks, pt sent for a flush today

## 2012-04-08 NOTE — Patient Instructions (Addendum)
Use Imodium and increase hydration for loose stools Continue taking Xeloda as prescribed Continue taking Zofran and Compazine for nausea

## 2012-04-08 NOTE — Progress Notes (Signed)
Patient ID: [name], male   DOB: [birth year], 46 y.o.   MRN: [id number] CSN: [id number]  CC: [name], M.D.  [name], M.D.  Adolph Pollack, M.D.  [name], MD  Identifying Statement: [name] is a 46 y.o. 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  He was last seen in our office by Dr. Dalene Carrow on 03/24/2012.  The patient is receiving radiation therapy with CyberKnife directed by Dr. Kathrynn Running.  He is receiving radiation therapy 5 days per week for 4 weeks and he is on his 3rd week of therapy.  The patient is also receiving radiosensitizing Xeloda 1000 mg PO BID that was started on 03/23/2012.  The patient takes Xeloda during the 5 days of radiation therapy, then off for 2 days.  During today's visit, the patient had complaints of increased stool frequency and stool looseness.  The patient was advised to use Imodium as directed and to increase his oral hydration.  [name] is also experiencing increased fatigue.  He has continuing nausea without emesis, but states it is well controlled with Zofran and Compazine.  The patient denies any other symptomatology including chills, fever, unusual bleed, hand-foot disease and constipation.   Medications: Current Outpatient Prescriptions  Medication Sig Dispense Refill  . capecitabine (XELODA) 500 MG tablet Take 2 tablets (1,000 mg total) by mouth 2 (two) times daily after a meal. Take 2 tabs = 1000 mg po  BID -  To begin with and for the duration of radiation.  168 tablet  0  . dicyclomine (BENTYL) 10 MG capsule Take 10 mg by mouth 3 (three) times daily.       Marland Kitchen GLUCOSAMINE HCL PO Take 1 tablet by mouth daily.      Marland Kitchen loperamide (IMODIUM) 2 MG capsule Take 2 mg by mouth 4 (four) times daily as needed. For diarrhea      .  ondansetron (ZOFRAN ODT) 8 MG disintegrating tablet Take 1 tablet (8 mg total) by mouth every 8 (eight) hours as needed for nausea (Pt receiving radiation).  30 tablet  1  . ondansetron (ZOFRAN) 8 MG tablet Take 1 tablet (8 mg total) by mouth every 12 (twelve) hours as needed for nausea.  30 tablet  1  . oxyCODONE (OXY IR/ROXICODONE) 5 MG immediate release tablet Take 1-3 tablets (5-15 mg total) by mouth every 4 (four) hours as needed for pain. For pain  90 tablet  0  . prochlorperazine (COMPAZINE) 10 MG tablet Take 1 tablet (10 mg total) by mouth every 6 (six) hours as needed.  30 tablet  0  . Pyridoxine HCl (VITAMIN B-6 PO) Take 1 tablet by mouth daily.      . rosuvastatin (CRESTOR) 5 MG tablet Take 5 mg by mouth at bedtime.        Allergies: Allergies  Allergen Reactions  . Penicillins Itching    Intermittent reaction per pt    Past Medical History: Past Medical History  Diagnosis Date  . Gout   . Hyperlipemia   . Hemorrhoid   . Colorectal cancer   . S/P radiation therapy 04/16/11 - 05/23/11  . Frequent bowel movements Noted 02/19/12  . Pulmonary nodules 02/19/12    small-stable  . Hepatic steatosis   . Lymphadenopathy, retroperitoneal Pet Scan 02/13/12  aortacaval space  . Status post chemotherapy 04/16/11 - 05/21/11    6 Cycles Oxaliplatin based therapy with 2 cycles of FOLFOX and 4 cycles of XELOX  . Depressed Noted 02/19/12    Dr. Dalene Carrow  . Testicular atrophy     right  . ED (erectile dysfunction)      Family History: Family History  Problem Relation Age of Onset  . Cancer Maternal Grandfather     throat  . Cancer Cousin     breast, maternal  . Cancer Maternal Grandmother     unknown primary    Social History: History  Substance Use Topics  . Smoking status: Former Smoker -- 1.0 packs/day for 18 years    Types: Cigarettes    Quit date: 10/09/1997  . Smokeless tobacco: Never Used  . Alcohol Use: Yes     occasionally    Review of Systems: 10 point review  of systems was completed and is negative except as noted above.   Physical Exam: Blood pressure 125/79, pulse 62, temperature 97.8 F (36.6 C), temperature source Oral, resp. rate 20, height 5\' 11"  (1.803 m), weight 206 lb 6.4 oz (93.622 kg).  General appearance: Alert, cooperative, well nourished, no apparent distress Head: Normocephalic, without obvious abnormality, atraumatic Eyes: Conjunctivae/corneas clear,  PERRLA, EOMI Nose: Nares, septum and mucosa are normal, no drainage or sinus tenderness Neck: No adenopathy, supple, symmetrical, trachea midline, thyroid not enlarged, no tenderness Resp: Clear to auscultation bilaterally Cardio: Regular rate and rhythm, S1, S2 normal, no murmur, click, rub or gallop GI: Soft, not distended,  non-tender, hypoactive bowel sounds, no organomegaly Extremities: Extremities normal, atraumatic, no cyanosis or edema Lymph nodes: Cervical, supraclavicular, and axillary nodes normal Neurologic: Grossly normal   Laboratory Data: Results for orders placed in visit on 04/08/12 (from the past 48 hour(s))  CBC WITH DIFFERENTIAL     Status: Abnormal   Collection Time   04/08/12  8:18 AM      Component Value Range Comment   WBC 2.9 (*) 4.0 - 10.3 10e3/uL    NEUT# 2.0  1.5 - 6.5 10e3/uL    HGB 14.7  13.0 - 17.1 g/dL    HCT 78.2  95.6 - 21.3 %    Platelets 160  140 - 400 10e3/uL    MCV 83.5  79.3 - 98.0 fL    MCH 28.7  27.2 - 33.4 pg    MCHC 34.4  32.0 - 36.0 g/dL    RBC 0.86  5.78 - 4.69 10e6/uL    RDW 13.5  11.0 - 14.6 %    lymph# 0.5 (*) 0.9 - 3.3 10e3/uL    MONO# 0.3  0.1 - 0.9 10e3/uL    Eosinophils Absolute 0.1  0.0 - 0.5 10e3/uL    Basophils Absolute 0.0  0.0 - 0.1 10e3/uL    NEUT% 68.8  39.0 - 75.0 %    LYMPH% 17.8  14.0 - 49.0 %    MONO% 9.1  0.0 - 14.0 %    EOS% 3.7  0.0 - 7.0 %    BASO% 0.6  0.0 - 2.0 %       Impression/Plan: [name] is a 46 year old man with 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 He began localized radiation therapy with CyberKnife concomitantly with Xeloda (1000 mg PO BID on 03/23/2012).  Radiation and Xeloda therapy will continue until 04/20/2012.  We plan to see the patient again  on 04/29/12 for an office visit with Dr. Dalene Carrow.  Laboratories (CMP, CBC and CEA) will be drawn prior to his office visit.  The patient will contact us in the interim if he has any questions or concerns.  The plan of care for this patient was discussed with Dr. Dalene Carrow.   Larina Bras, NP-C 04/08/2012, 9:49 AM

## 2012-04-09 ENCOUNTER — Ambulatory Visit
Admission: RE | Admit: 2012-04-09 | Discharge: 2012-04-09 | Disposition: A | Discharge status: Home / Self Care | Payer: BC Managed Care – PPO | Source: Ambulatory Visit | Attending: Radiation Oncology | Admitting: Radiation Oncology

## 2012-04-09 ENCOUNTER — Encounter: Payer: Self-pay | Admitting: Radiation Oncology

## 2012-04-09 VITALS — BP 139/90 | HR 71 | Temp 97.9°F | Resp 20 | Wt 209.5 lb

## 2012-04-09 DIAGNOSIS — C20 Malignant neoplasm of rectum: Secondary | ICD-10-CM

## 2012-04-09 NOTE — Progress Notes (Signed)
Pt denies pain, nausea, loss of appetite, urinary issues.  Pt continues to have multiple painful bm's q 2-3 days beginning w/soft bm's then diarrhea. He states he "was this way with his first radiation tx. It is no different now." He reports fatigue, naps x 3-4 hrs daily. Pt not working at this time.

## 2012-04-09 NOTE — Progress Notes (Signed)
  Radiation Oncology         [phone] ________________________________  Name: [name] MRN: [id number]  Date: 04/09/2012  DOB: 1965/11/23  Weekly Radiation Therapy Management  Current Dose: 39 Gy     Planned Dose:  60 Gy  Narrative . . . . . . . . The patient presents for routine under treatment assessment.                                                   Pt denies pain, nausea, loss of appetite, urinary issues. Pt continues to have multiple painful bm's q 2-3 days beginning w/soft bm's then diarrhea. He states he "was this way with his first radiation tx. It is no different now." He reports fatigue, naps x 3-4 hrs daily. Pt not working at this time.   The patient is without complaint.                                 Set-up films were reviewed.                                 The chart was checked. Physical Findings. . . Weight essentially stable.  Filed Weights   04/09/12 0850  Weight: 209 lb 8 oz (95.029 kg)   Filed Vitals:   04/09/12 0850  BP: 139/90  Pulse: 71  Temp: 97.9 F (36.6 C)  Resp: 20   No significant changes. Impression . . . . . . . The patient is  tolerating radiation. Plan . . . . . . . . . . . . Continue treatment as planned.  ________________________________  Artist Pais. Kathrynn Running, M.D.

## 2012-04-10 ENCOUNTER — Telehealth: Payer: Self-pay | Admitting: *Deleted

## 2012-04-10 ENCOUNTER — Ambulatory Visit: Payer: BC Managed Care – PPO

## 2012-04-10 ENCOUNTER — Encounter: Payer: Self-pay | Admitting: Radiation Oncology

## 2012-04-10 NOTE — Telephone Encounter (Signed)
Received message from operator re:  Pt had called early this am stating that he had severe diarrhea. Called pt back and was informed that pt is feeling much better now.  Diarrhea had stopped after pt took  Imodium 3 tabs at 3 am,  And again 2 tabs late morning.   Pt stated he has been drinking lots of water and gatorade.   Pt notified radiation dept that he would not be coming for radiation today.    Asked and offered pt to come in for IVF now since pt had diarrhea this am , and also with the weekend coming , pt might need extra hydration.  Pt  Refused  And stated  "  I feel a whole lot better now. "   RN offered pt several times for IVF, but pt continued to refuse.    Pt stated he would call office back if he has any new problems.    Pt thought maybe what he ate last night along with radiation that contributed to his diarrhea -  Not  Xeloda. Pt's   Phone    [phone].

## 2012-04-13 ENCOUNTER — Encounter: Payer: Self-pay | Admitting: Radiation Oncology

## 2012-04-13 ENCOUNTER — Ambulatory Visit
Admission: RE | Admit: 2012-04-13 | Discharge: 2012-04-13 | Disposition: A | Discharge status: Home / Self Care | Payer: BC Managed Care – PPO | Source: Ambulatory Visit | Attending: Radiation Oncology | Admitting: Radiation Oncology

## 2012-04-14 ENCOUNTER — Ambulatory Visit
Admission: RE | Admit: 2012-04-14 | Discharge: 2012-04-14 | Disposition: A | Discharge status: Home / Self Care | Payer: BC Managed Care – PPO | Source: Ambulatory Visit | Attending: Radiation Oncology | Admitting: Radiation Oncology

## 2012-04-14 ENCOUNTER — Encounter: Payer: Self-pay | Admitting: Radiation Oncology

## 2012-04-15 ENCOUNTER — Ambulatory Visit
Admission: RE | Admit: 2012-04-15 | Discharge: 2012-04-15 | Disposition: A | Discharge status: Home / Self Care | Payer: BC Managed Care – PPO | Source: Ambulatory Visit | Attending: Radiation Oncology | Admitting: Radiation Oncology

## 2012-04-15 ENCOUNTER — Encounter: Payer: Self-pay | Admitting: Radiation Oncology

## 2012-04-16 ENCOUNTER — Other Ambulatory Visit: Payer: Self-pay | Admitting: *Deleted

## 2012-04-16 ENCOUNTER — Ambulatory Visit
Admission: RE | Admit: 2012-04-16 | Discharge: 2012-04-16 | Disposition: A | Discharge status: Home / Self Care | Payer: BC Managed Care – PPO | Source: Ambulatory Visit | Attending: Radiation Oncology | Admitting: Radiation Oncology

## 2012-04-16 ENCOUNTER — Encounter: Payer: Self-pay | Admitting: Radiation Oncology

## 2012-04-17 ENCOUNTER — Ambulatory Visit
Admission: RE | Admit: 2012-04-17 | Discharge: 2012-04-17 | Disposition: A | Discharge status: Home / Self Care | Payer: BC Managed Care – PPO | Source: Ambulatory Visit | Attending: Radiation Oncology | Admitting: Radiation Oncology

## 2012-04-17 ENCOUNTER — Encounter: Payer: Self-pay | Admitting: Radiation Oncology

## 2012-04-17 ENCOUNTER — Encounter: Payer: Self-pay | Admitting: Hematology and Oncology

## 2012-04-17 VITALS — BP 127/89 | HR 84 | Temp 98.9°F | Resp 20 | Wt 206.8 lb

## 2012-04-17 DIAGNOSIS — C772 Secondary and unspecified malignant neoplasm of intra-abdominal lymph nodes: Secondary | ICD-10-CM

## 2012-04-17 DIAGNOSIS — C20 Malignant neoplasm of rectum: Secondary | ICD-10-CM

## 2012-04-17 DIAGNOSIS — S62339A Displaced fracture of neck of unspecified metacarpal bone, initial encounter for closed fracture: Secondary | ICD-10-CM

## 2012-04-17 NOTE — Progress Notes (Unsigned)
Put wife's fmla form on nurse's desk. °

## 2012-04-17 NOTE — Progress Notes (Signed)
  Radiation Oncology         [phone] ________________________________  Name: [name] MRN: [id number]  Date: 04/17/2012  DOB: [birth year]  Weekly Radiation Therapy Management  Current Dose: 54 Gy     Planned Dose:  60 Gy  Narrative . . . . . . . . The patient presents for routine under treatment assessment.Pt states he continues to have same pattern w/BM's, will go q 3-4 days w/diarrhea mult times accompanied w/rectal pain. He denies pain today, loss of appetite, has experienced increased fatigue at end of each week. Pt states he had RLQ abd pain, ache yesterday; took Oxy-IR 2 tabs w/100% relief. He states this was the first time he'd taken pain med in 5-6 days. Pt completes tx next Tues; gave FU card   The patient is without complaint.                                 Set-up films were reviewed.                                 The chart was checked. Physical Findings. . .  weight is 206 lb 12.8 oz (93.804 kg). His temperature is 98.9 F (37.2 C). His blood pressure is 127/89 and his pulse is 84. His respiration is 20. . Weight essentially stable.  No significant changes. Impression . . . . . . . The patient is  tolerating radiation. Plan . . . . . . . . . . . . Continue treatment as planned.  ________________________________  Artist Pais. Kathrynn Running, M.D.

## 2012-04-17 NOTE — Progress Notes (Signed)
Pt states he continues to have same pattern w/BM's, will go q 3-4 days w/diarrhea mult times accompanied w/rectal pain. He denies pain today, loss of appetite, has experienced increased fatigue at end of each week. Pt states he had RLQ abd pain, ache yesterday; took Oxy-IR 2 tabs w/100% relief. He states this was the first time he'd taken pain med in 5-6 days. Pt completes tx next Tues; gave FU card.

## 2012-04-20 ENCOUNTER — Ambulatory Visit
Admission: RE | Admit: 2012-04-20 | Discharge: 2012-04-20 | Disposition: A | Discharge status: Home / Self Care | Payer: BC Managed Care – PPO | Source: Ambulatory Visit | Attending: Radiation Oncology | Admitting: Radiation Oncology

## 2012-04-20 ENCOUNTER — Encounter: Payer: Self-pay | Admitting: Radiation Oncology

## 2012-04-21 ENCOUNTER — Encounter: Payer: Self-pay | Admitting: Radiation Oncology

## 2012-04-21 ENCOUNTER — Ambulatory Visit
Admission: RE | Admit: 2012-04-21 | Discharge: 2012-04-21 | Disposition: A | Discharge status: Home / Self Care | Payer: BC Managed Care – PPO | Source: Ambulatory Visit | Attending: Radiation Oncology | Admitting: Radiation Oncology

## 2012-04-21 ENCOUNTER — Encounter: Payer: Self-pay | Admitting: Hematology and Oncology

## 2012-04-21 NOTE — Progress Notes (Signed)
Faxed wife's fmla form to [employer] @ [phone].

## 2012-04-21 NOTE — Progress Notes (Signed)
  Radiation Oncology         [phone] ________________________________  Name: [name] MRN: [id number]  Date: 04/21/2012  DOB: October 17, 1965  End of Treatment Note  Diagnosis:  46 year old gentleman with isolated paraaortic lymph node recurrence from rectal cancer - Stage rT0 N0 M1a - IVa   Indication for treatment:  Curative, salvage para-aortic radiotherapy for isolated oligo metastatic disease       Radiation treatment dates:  03/23/2012-04/21/2012  Site/dose:   The involved gross adenopathy in the perirectal region was treated to 60 gray in 20 fractions of 3 gray  Beams/energy:   Helical Tomotherapy was used to deliver intensity modulated radiotherapy to the target volumes while maximally excluding radiation to the nearby normal organs which included but were not limited to the kidneys, liver, spinal cord, small bowel, and stomach. Image guidance was performed daily with megavoltage CT studies prior to each fraction for enhanced real-time precision  Narrative: The patient tolerated radiation treatment relatively well.   He did experience some nausea as well as episodes of diarrhea during the first week of radiation treatment. However, these seemed to have date after the first week and he tolerated the remainder of his course radiation remarkably well.  Plan: The patient has completed radiation treatment. The patient will return to radiation oncology clinic for routine followup in one month. I advised them to call or return sooner if they have any questions or concerns related to their recovery or treatment. ________________________________  Artist Pais. Kathrynn Running, M.D.

## 2012-04-27 ENCOUNTER — Other Ambulatory Visit (HOSPITAL_BASED_OUTPATIENT_CLINIC_OR_DEPARTMENT_OTHER): Payer: BC Managed Care – PPO | Admitting: Lab

## 2012-04-27 DIAGNOSIS — C20 Malignant neoplasm of rectum: Secondary | ICD-10-CM

## 2012-04-27 LAB — CBC WITH DIFFERENTIAL/PLATELET
BASO%: 0.8 % (ref 0.0–2.0)
Basophils Absolute: 0 10*3/uL (ref 0.0–0.1)
EOS%: 4.6 % (ref 0.0–7.0)
HGB: 13.9 g/dL (ref 13.0–17.1)
MCH: 29.4 pg (ref 27.2–33.4)
MCHC: 34.8 g/dL (ref 32.0–36.0)
RBC: 4.75 10*6/uL (ref 4.20–5.82)
RDW: 14.9 % — ABNORMAL HIGH (ref 11.0–14.6)
lymph#: 0.5 10*3/uL — ABNORMAL LOW (ref 0.9–3.3)

## 2012-04-27 LAB — COMPREHENSIVE METABOLIC PANEL (CC13)
ALT: 27 U/L (ref 0–55)
AST: 20 U/L (ref 5–34)
Albumin: 4.2 g/dL (ref 3.5–5.0)
Calcium: 9.3 mg/dL (ref 8.4–10.4)
Chloride: 109 mEq/L — ABNORMAL HIGH (ref 98–107)
Potassium: 4.4 mEq/L (ref 3.5–5.1)
Sodium: 140 mEq/L (ref 136–145)
Total Protein: 6.9 g/dL (ref 6.4–8.3)

## 2012-04-29 ENCOUNTER — Ambulatory Visit (HOSPITAL_BASED_OUTPATIENT_CLINIC_OR_DEPARTMENT_OTHER): Payer: BC Managed Care – PPO | Admitting: Hematology and Oncology

## 2012-04-29 ENCOUNTER — Encounter: Payer: Self-pay | Admitting: Hematology and Oncology

## 2012-04-29 ENCOUNTER — Other Ambulatory Visit: Payer: Self-pay | Admitting: *Deleted

## 2012-04-29 ENCOUNTER — Telehealth: Payer: Self-pay | Admitting: Hematology and Oncology

## 2012-04-29 VITALS — BP 135/80 | HR 74 | Temp 97.5°F | Resp 20 | Ht 71.0 in | Wt 209.3 lb

## 2012-04-29 DIAGNOSIS — C772 Secondary and unspecified malignant neoplasm of intra-abdominal lymph nodes: Secondary | ICD-10-CM

## 2012-04-29 DIAGNOSIS — C20 Malignant neoplasm of rectum: Secondary | ICD-10-CM

## 2012-04-29 DIAGNOSIS — Z23 Encounter for immunization: Secondary | ICD-10-CM

## 2012-04-29 MED ORDER — INFLUENZA VIRUS VACC SPLIT PF IM SUSP
0.5000 mL | Freq: Once | INTRAMUSCULAR | Status: AC
  Administered 2012-04-29: 0.5 mL via INTRAMUSCULAR
  Filled 2012-04-29: qty 0.5

## 2012-04-29 NOTE — Telephone Encounter (Signed)
appts made and printed for pt aom °

## 2012-04-29 NOTE — Progress Notes (Signed)
This office note has been dictated.

## 2012-04-29 NOTE — Patient Instructions (Addendum)
[name]  [id number]   [facility] - AFTER VISIT SUMMARY   **RECOMMENDATIONS MADE BY THE CONSULTANT AND ANY TEST    RESULTS WILL BE SENT TO YOUR REFERRING DOCTORS.   YOUR EXAM FINDINGS, LABS AND RESULTS WERE DISCUSSED BY YOUR MD TODAY.  YOU CAN GO TO THE [facility] WEB SITE FOR INSTRUCTIONS ON HOW TO ASSESS MY CHART FOR ADDITIONAL INFORMATION AS NEEDED.  Your Updated drug allergies are: Allergies as of 04/29/2012 - Review Complete 04/29/2012  Allergen Reaction Noted  . Penicillins Itching 07/06/2011    Your current list of medications are: Current Outpatient Prescriptions  Medication Sig Dispense Refill  . GLUCOSAMINE HCL PO Take 1 tablet by mouth daily.      . Pyridoxine HCl (VITAMIN B-6 PO) Take 1 tablet by mouth daily.      . rosuvastatin (CRESTOR) 5 MG tablet Take 5 mg by mouth at bedtime.      . dicyclomine (BENTYL) 10 MG capsule Take 10 mg by mouth 3 (three) times daily.       Marland Kitchen loperamide (IMODIUM) 2 MG capsule Take 2 mg by mouth 4 (four) times daily as needed. For diarrhea      . ondansetron (ZOFRAN ODT) 8 MG disintegrating tablet Take 1 tablet (8 mg total) by mouth every 8 (eight) hours as needed for nausea (Pt receiving radiation).  30 tablet  1  . ondansetron (ZOFRAN) 8 MG tablet Take 1 tablet (8 mg total) by mouth every 12 (twelve) hours as needed for nausea.  30 tablet  1  . oxyCODONE (OXY IR/ROXICODONE) 5 MG immediate release tablet Take 1-3 tablets (5-15 mg total) by mouth every 4 (four) hours as needed for pain. For pain  90 tablet  0  . prochlorperazine (COMPAZINE) 10 MG tablet Take 1 tablet (10 mg total) by mouth every 6 (six) hours as needed.  30 tablet  0     INSTRUCTIONS GIVEN AND DISCUSSED:  See attached schedule   SPECIAL INSTRUCTIONS/FOLLOW-UP:  See above.  I acknowledge that I have been informed and understand all the instructions given to me and received a copy.I know to contact the clinic, my physician, or go to the emergency  Department if any problems should occur.   I do not have any more questions at this time, but understand that I may call the [facility] at [phone] during business hours should I have any further questions or need assistance in obtaining follow-up care.

## 2012-04-29 NOTE — Progress Notes (Signed)
Faxed xeloda prescription to [employer] @ [phone] phone # [phone].

## 2012-04-30 ENCOUNTER — Other Ambulatory Visit: Payer: Self-pay | Admitting: *Deleted

## 2012-04-30 DIAGNOSIS — C20 Malignant neoplasm of rectum: Secondary | ICD-10-CM

## 2012-04-30 MED ORDER — CAPECITABINE 500 MG PO TABS: 1500.0000 mg | ORAL_TABLET | Freq: Two times a day (BID) | ORAL | Status: DC

## 2012-04-30 NOTE — Progress Notes (Signed)
CC:   [name], M.D. [name], M.D. Adolph Pollack, M.D.  IDENTIFYING STATEMENT:  Patient is a 46 year old man with recurrent rectal cancer who presents for followup.  INTERVAL HISTORY:  The patient has completed CyberKnife radiotherapy concomitantly with Xeloda, beginning treatment on 03/23/2012 through 04/21/2012.  Besides nausea he tolerated therapy reasonably well.  He is now back at work.  He denies pain.  He is moving his bowels without difficulty.  MEDICATIONS:  Reviewed and updated.  ALLERGIES:  Penicillin.  PAST MEDICAL HISTORY:  Unchanged.  FAMILY HISTORY:  Unchanged.  SOCIAL HISTORY:  Unchanged.  REVIEW OF SYSTEMS:  Ten point review of systems negative.  PHYSICAL EXAM:  The patient is a well-appearing, well-nourished man in no distress.  Vitals:  Pulse 74, blood pressure 135/80, temperature 97.5, respirations 20, weight 209 pounds.  HEENT:  Head is atraumatic, normocephalic.  Sclerae anicteric.  Mouth moist.  Chest:  Clear.  CVS: Unremarkable.  Abdomen:  Soft, nontender.  Bowel sounds present. Extremities:  No edema.  LAB DATA:  On 04/27/2012 white cell count 3.5, hemoglobin 13.9, hematocrit 40.1, platelets 138.  Sodium 140, potassium 4.4, chloride 109, CO2 of 21, BUN 10, creatinine 0.8, glucose 95.  T bili 0.8, alkaline phosphatase 54, AST 20, ALT 27.  CEA 13.7 (58.7).  IMPRESSION AND PLAN:  Mr. 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  He began localized radiation therapy with CyberKnife concomitantly with Xeloda (1000 mg p.o. b.i.d.) on 03/23/2012.  He completed therapy on 04/21/2012.  His CEA levels have  decreased.  He wants to get back to work and cannot work with infusional pump.  I will have him begin Xeloda at a higher dose of 1500 mg p.o. b.i.d. for 14 days followed by a week's rest.  I plan to have him follow up with a CEA  level and a PET scan in about 6 weeks' time.  We will thereafter consider reinitiating treatment with Avastin and FOLFIRI.   ______________________________ Laurice Record, M.D. LIO/MEDQ  D:  04/29/2012  T:  04/30/2012  Job:  [id number]

## 2012-05-07 ENCOUNTER — Telehealth: Payer: Self-pay | Admitting: Nurse Practitioner

## 2012-05-07 NOTE — Telephone Encounter (Signed)
Received call from patient.  States he is scheduled to receive Xeloda tomorrow- 05/08/2012.  Pt inquiring when he is to start?  Information forwarded to Dr. Dalene Carrow.  Informed pt will call him with instructions.

## 2012-05-08 NOTE — Telephone Encounter (Signed)
Spoke with patient.  Advised to start Xeloda today.  Keep all appts as currently scheduled.

## 2012-05-20 DIAGNOSIS — K649 Unspecified hemorrhoids: Secondary | ICD-10-CM | POA: Insufficient documentation

## 2012-05-20 DIAGNOSIS — N500 Atrophy of testis: Secondary | ICD-10-CM | POA: Insufficient documentation

## 2012-05-20 DIAGNOSIS — Z9221 Personal history of antineoplastic chemotherapy: Secondary | ICD-10-CM | POA: Insufficient documentation

## 2012-05-20 DIAGNOSIS — K760 Fatty (change of) liver, not elsewhere classified: Secondary | ICD-10-CM | POA: Insufficient documentation

## 2012-05-20 DIAGNOSIS — R194 Change in bowel habit: Secondary | ICD-10-CM | POA: Insufficient documentation

## 2012-05-20 DIAGNOSIS — C19 Malignant neoplasm of rectosigmoid junction: Secondary | ICD-10-CM | POA: Insufficient documentation

## 2012-05-20 DIAGNOSIS — E785 Hyperlipidemia, unspecified: Secondary | ICD-10-CM | POA: Insufficient documentation

## 2012-05-20 DIAGNOSIS — F329 Major depressive disorder, single episode, unspecified: Secondary | ICD-10-CM | POA: Insufficient documentation

## 2012-05-20 DIAGNOSIS — N529 Male erectile dysfunction, unspecified: Secondary | ICD-10-CM | POA: Insufficient documentation

## 2012-05-20 DIAGNOSIS — R590 Localized enlarged lymph nodes: Secondary | ICD-10-CM | POA: Insufficient documentation

## 2012-05-20 DIAGNOSIS — Z923 Personal history of irradiation: Secondary | ICD-10-CM | POA: Insufficient documentation

## 2012-05-21 ENCOUNTER — Encounter: Payer: Self-pay | Admitting: Radiation Oncology

## 2012-05-21 ENCOUNTER — Ambulatory Visit
Admission: RE | Admit: 2012-05-21 | Discharge: 2012-05-21 | Disposition: A | Discharge status: Home / Self Care | Payer: BC Managed Care – PPO | Source: Ambulatory Visit | Attending: Radiation Oncology | Admitting: Radiation Oncology

## 2012-05-21 VITALS — BP 136/79 | HR 73 | Temp 98.1°F | Resp 20 | Wt 209.7 lb

## 2012-05-21 DIAGNOSIS — C20 Malignant neoplasm of rectum: Secondary | ICD-10-CM

## 2012-05-21 NOTE — Progress Notes (Signed)
  Radiation Oncology         [phone] ________________________________  Name: [name] MRN: [id number]  Date: 05/21/2012  DOB: Nov 24, 1965  Follow-Up Visit Note  CC: Delorse Lek, MD  Delorse Lek, MD  Diagnosis:   46 year old gentleman with isolated paraaortic lymph node recurrence from rectal cancer s/p curative, salvage para-aortic radiotherapy for isolated oligo metastatic disease from 03/23/2012-04/21/2012 to 60 gray in 20 fractions of 3 gray  Interval Since Last Radiation:  1 months  Narrative:  The patient returns today for routine follow-up.  Pt reports fatigue, pain when passing soft BMs daily, large amts in morning, occasionally stools continue through day and become looser. Takes Imodium prn. No loss of appetite, no nausea.       ALLERGIES:  is allergic to penicillins.  Meds: Current Outpatient Prescriptions  Medication Sig Dispense Refill  . capecitabine (XELODA) 500 MG tablet Take 3 tablets (1,500 mg total) by mouth 2 (two) times daily after a meal. Take 3 tabs po BID for 14 days followed by  7 days off.  84 tablet  3  . dicyclomine (BENTYL) 10 MG capsule Take 10 mg by mouth 3 (three) times daily.       Marland Kitchen GLUCOSAMINE HCL PO Take 1 tablet by mouth daily.      Marland Kitchen loperamide (IMODIUM) 2 MG capsule Take 2 mg by mouth 4 (four) times daily as needed. For diarrhea      . ondansetron (ZOFRAN) 8 MG tablet Take 1 tablet (8 mg total) by mouth every 12 (twelve) hours as needed for nausea.  30 tablet  1  . oxyCODONE (OXY IR/ROXICODONE) 5 MG immediate release tablet Take 1-3 tablets (5-15 mg total) by mouth every 4 (four) hours as needed for pain. For pain  90 tablet  0  . Pyridoxine HCl (VITAMIN B-6 PO) Take 1 tablet by mouth daily.      . rosuvastatin (CRESTOR) 5 MG tablet Take 5 mg by mouth at bedtime.      . ondansetron (ZOFRAN ODT) 8 MG disintegrating tablet Take 1 tablet (8 mg total) by mouth every 8 (eight) hours as needed for nausea (Pt receiving radiation).  30  tablet  1  . prochlorperazine (COMPAZINE) 10 MG tablet Take 1 tablet (10 mg total) by mouth every 6 (six) hours as needed.  30 tablet  0    Physical Findings: The patient is in no acute distress. Patient is alert and oriented.  weight is 209 lb 11.2 oz (95.119 kg). His oral temperature is 98.1 F (36.7 C). His blood pressure is 136/79 and his pulse is 73. His respiration is 20. Marland Kitchen  No significant changes.  Impression:  The patient is recovering from the effects of radiation.    Plan:  Follow up in  3 months  _____________________________________  Artist Pais. Kathrynn Running, M.D.

## 2012-05-21 NOTE — Progress Notes (Signed)
Pt reports fatigue, pain when passing soft BMs daily, large amts in morning, occasionally stools continue through day and become looser. Takes Imodium prn. No loss of appetite, no nausea.

## 2012-05-26 ENCOUNTER — Telehealth: Payer: Self-pay | Admitting: *Deleted

## 2012-05-26 NOTE — Telephone Encounter (Signed)
Received message from pt wanting to know if pt should reorder Xeloda meds.  Spoke with pt and was informed that pt had completed  14 days of Xeloda on Fri. 05/22/12.   RN noted from md's notes that further recommendations will be based on PET scan results and CEA level -  Scheduled for 05/29/12.   Informed pt to wait until after scans and f/u with md .  Pt voiced understanding.

## 2012-05-29 ENCOUNTER — Other Ambulatory Visit: Payer: Self-pay | Admitting: Hematology and Oncology

## 2012-05-29 ENCOUNTER — Other Ambulatory Visit (HOSPITAL_BASED_OUTPATIENT_CLINIC_OR_DEPARTMENT_OTHER): Payer: BC Managed Care – PPO | Admitting: Lab

## 2012-05-29 ENCOUNTER — Encounter (HOSPITAL_COMMUNITY)
Admission: RE | Admit: 2012-05-29 | Discharge: 2012-05-29 | Disposition: A | Discharge status: Home / Self Care | Payer: BC Managed Care – PPO | Source: Ambulatory Visit | Attending: Hematology and Oncology | Admitting: Hematology and Oncology

## 2012-05-29 DIAGNOSIS — C20 Malignant neoplasm of rectum: Secondary | ICD-10-CM

## 2012-05-29 DIAGNOSIS — R599 Enlarged lymph nodes, unspecified: Secondary | ICD-10-CM | POA: Insufficient documentation

## 2012-05-29 LAB — COMPREHENSIVE METABOLIC PANEL (CC13)
ALT: 28 U/L (ref 0–55)
CO2: 25 mEq/L (ref 22–29)
Calcium: 9.3 mg/dL (ref 8.4–10.4)
Chloride: 111 mEq/L — ABNORMAL HIGH (ref 98–107)
Creatinine: 0.8 mg/dL (ref 0.7–1.3)
Glucose: 100 mg/dl — ABNORMAL HIGH (ref 70–99)
Total Bilirubin: 0.94 mg/dL (ref 0.20–1.20)
Total Protein: 6.9 g/dL (ref 6.4–8.3)

## 2012-05-29 LAB — CBC WITH DIFFERENTIAL/PLATELET
BASO%: 0.7 % (ref 0.0–2.0)
Eosinophils Absolute: 0.1 10*3/uL (ref 0.0–0.5)
HCT: 38.2 % — ABNORMAL LOW (ref 38.4–49.9)
LYMPH%: 17.9 % (ref 14.0–49.0)
MCHC: 35.7 g/dL (ref 32.0–36.0)
MONO#: 0.3 10*3/uL (ref 0.1–0.9)
NEUT#: 1.9 10*3/uL (ref 1.5–6.5)
Platelets: 141 10*3/uL (ref 140–400)
RBC: 4.4 10*6/uL (ref 4.20–5.82)
WBC: 2.9 10*3/uL — ABNORMAL LOW (ref 4.0–10.3)
lymph#: 0.5 10*3/uL — ABNORMAL LOW (ref 0.9–3.3)

## 2012-05-29 LAB — GLUCOSE, CAPILLARY: Glucose-Capillary: 92 mg/dL (ref 70–99)

## 2012-05-29 MED ORDER — FLUDEOXYGLUCOSE F - 18 (FDG) INJECTION
16.3000 | Freq: Once | INTRAVENOUS | Status: AC | PRN
  Administered 2012-05-29: 16.3 via INTRAVENOUS

## 2012-06-02 ENCOUNTER — Encounter: Payer: Self-pay | Admitting: Hematology and Oncology

## 2012-06-02 ENCOUNTER — Ambulatory Visit (HOSPITAL_BASED_OUTPATIENT_CLINIC_OR_DEPARTMENT_OTHER): Payer: BC Managed Care – PPO | Admitting: Hematology and Oncology

## 2012-06-02 ENCOUNTER — Telehealth: Payer: Self-pay | Admitting: *Deleted

## 2012-06-02 VITALS — BP 139/87 | HR 82 | Temp 97.5°F | Resp 18 | Ht 71.0 in | Wt 210.9 lb

## 2012-06-02 DIAGNOSIS — C20 Malignant neoplasm of rectum: Secondary | ICD-10-CM

## 2012-06-02 DIAGNOSIS — C774 Secondary and unspecified malignant neoplasm of inguinal and lower limb lymph nodes: Secondary | ICD-10-CM

## 2012-06-02 MED ORDER — DEXAMETHASONE 4 MG PO TABS: ORAL_TABLET | ORAL | Status: DC

## 2012-06-02 MED ORDER — ONDANSETRON HCL 8 MG PO TABS: ORAL_TABLET | ORAL | Status: DC

## 2012-06-02 MED ORDER — ONDANSETRON HCL 8 MG PO TABS: 8.0000 mg | ORAL_TABLET | Freq: Two times a day (BID) | ORAL | Status: DC

## 2012-06-02 MED ORDER — DEXAMETHASONE 4 MG PO TABS: 8.0000 mg | ORAL_TABLET | Freq: Two times a day (BID) | ORAL | Status: DC

## 2012-06-02 NOTE — Progress Notes (Signed)
Called patient to advise him of the Neulasta that is to begin 06/19/12. He said it was too good to be true, nothing is FREE!!  I told him it is to him and only 25.00 per injection after 1st one. The 1st one is covered 100%. He said it was ok to sign for him and he will just come by on 06/17/12 to initial it for me. I will enroll him via website and fax form.

## 2012-06-02 NOTE — Patient Instructions (Addendum)
[name]  [id number]   [facility] - AFTER VISIT SUMMARY   **RECOMMENDATIONS MADE BY THE CONSULTANT AND ANY TEST    RESULTS WILL BE SENT TO YOUR REFERRING DOCTORS.   YOUR EXAM FINDINGS, LABS AND RESULTS WERE DISCUSSED BY YOUR MD TODAY.  YOU CAN GO TO THE [facility] WEB SITE FOR INSTRUCTIONS ON HOW TO ASSESS MY CHART FOR ADDITIONAL INFORMATION AS NEEDED.  Your Updated drug allergies are: Allergies as of 06/02/2012 - Review Complete 05/21/2012  Allergen Reaction Noted  . Penicillins Itching 07/06/2011    Your current list of medications are: Current Outpatient Prescriptions  Medication Sig Dispense Refill  . dicyclomine (BENTYL) 10 MG capsule Take 10 mg by mouth 3 (three) times daily.       Marland Kitchen GLUCOSAMINE HCL PO Take 1 tablet by mouth daily.      Marland Kitchen loperamide (IMODIUM) 2 MG capsule Take 2 mg by mouth 4 (four) times daily as needed. For diarrhea      . ondansetron (ZOFRAN ODT) 8 MG disintegrating tablet Take 1 tablet (8 mg total) by mouth every 8 (eight) hours as needed for nausea (Pt receiving radiation).  30 tablet  1  . ondansetron (ZOFRAN) 8 MG tablet Take 1 tablet (8 mg total) by mouth every 12 (twelve) hours as needed for nausea.  30 tablet  1  . oxyCODONE (OXY IR/ROXICODONE) 5 MG immediate release tablet Take 1-3 tablets (5-15 mg total) by mouth every 4 (four) hours as needed for pain. For pain  90 tablet  0  . prochlorperazine (COMPAZINE) 10 MG tablet Take 1 tablet (10 mg total) by mouth every 6 (six) hours as needed.  30 tablet  0  . Pyridoxine HCl (VITAMIN B-6 PO) Take 1 tablet by mouth daily.      . rosuvastatin (CRESTOR) 5 MG tablet Take 5 mg by mouth at bedtime.         INSTRUCTIONS GIVEN AND DISCUSSED:  See attached schedule   SPECIAL INSTRUCTIONS/FOLLOW-UP:  See above.  I acknowledge that I have been informed and understand all the instructions given to me and received a copy.I know to contact the clinic, my physician, or go to the emergency  Department if any problems should occur.   I do not have any more questions at this time, but understand that I may call the [facility] at [phone] during business hours should I have any further questions or need assistance in obtaining follow-up care.

## 2012-06-02 NOTE — Addendum Note (Signed)
Addended by: Barbara Cower on: 06/02/2012 02:18 PM   Modules accepted: Orders

## 2012-06-02 NOTE — Telephone Encounter (Signed)
Per staff message and POF I have scheduled appts. JW  

## 2012-06-02 NOTE — Progress Notes (Signed)
This office note has been dictated.

## 2012-06-03 ENCOUNTER — Telehealth: Payer: Self-pay | Admitting: Hematology and Oncology

## 2012-06-03 NOTE — Progress Notes (Signed)
CC:   [name], M.D. [name], M.D. [name], M.D.  IDENTIFYING STATEMENT:  The patient is a 46 year old man with recurrent rectal cancer who presents for followup.  INTERVAL HISTORY:  [name] completed a PET scan on May 29, 2012, that showed no hypermetabolic areas in the neck or chest.  The liver, pancreas, adrenal glands, and spleen were unremarkable.  The previously seen shotty hypermetabolic retroperitoneal lymph nodes in the aortocaval space had both decreased in size and hypermetabolic activity when compared to prior PET scan.  There was, however, consistent area of focal hypermetabolic activity in the anorectal junction with an SUV of 5.4.  Again, this has not changed significantly since the patient's last study.  There was no focal activity to suggest skeletal metastases.  CEA level had decreased to 8.3 (13.7).  [name] has no current complaints or concerns.  His energy levels are great.  He has no nausea or vomiting.  He has had no change in bowel function.  He completed Xeloda a week ago.  He denies hand-foot syndrome.  MEDICATIONS:  Reviewed and updated.  ALLERGIES:  Penicillin.  PAST MEDICAL HISTORY/FAMILY HISTORY/SOCIAL HISTORY:  Unchanged.  REVIEW OF SYSTEMS:  A 10-point review of systems negative.  PHYSICAL EXAM:  General:  The patient is a well-appearing, well- nourished man in no distress.  Vitals:  Pulse 82, blood pressure 139/87, temperature 97.5, respirations 18, weight 210 pounds.  HEENT:  Head is atraumatic and normocephalic.  Sclerae anicteric.  Mouth is moist. Neck:  Supple.  Chest:  Clear to percussion and auscultation.  CVS: First and second heart sounds present.  No added sounds or murmurs. Abdomen:  Soft, nontender.  No masses.  Bowel sounds present. Extremities:  No calf tenderness.  Lymph Nodes:  No adenopathy.  CNS: Nonfocal.  LAB DATA:  05/29/2012 white cell count 2.9, hemoglobin 13.7, hematocrit 38.2,  platelets 141.  Sodium 142, potassium 4.3 chloride 111, CO2 25, BUN 16, creatinine 0.8, glucose 100.  T bili 0.9, alkaline phosphatase 66, AST 21, ALT 28.  CEA 8.3.  Results of PET as in interval history.  IMPRESSION AND PLAN:  [name] is a 46 year old man with a recurrent KRAS wild type rectal cancer isolated to aortocaval lymph nodes and PET scan has shown activity at the rectal anastomosis.  He was diagnosed with node-positive rectal cancer, for which he underwent a proctectomy with coloanal anastomosis and ileostomy on September 05, 2010, for a T3 N1 (1 of 18 lymph nodes), moderately differentiated KRAS wild type adenocarcinoma of the rectum.  He went on to receive 6 cycles of oxaliplatin-based therapy with 2 cycles of FOLFOX and 4 cycles of XELOX. He then completed external radiation therapy with continuous infusion 5- FU from April 16, 2011, through May 21, 2011.  He then underwent surveillance and was found to have recurrent tumor isolated to aortocaval lymph nodes with an elevated CEA of 53 in September 2013.  He began localized radiation therapy with CyberKnife concomitantly with Xeloda 1000 mg p.o. b.i.d. on 03/23/2012, completing therapy on 04/21/2012.  His CEA has decreased.  A PET scan, although shows decrease in size and activity at the aortocaval nodal chain, but persistent hypermetabolic activity at the rectal anastomosis.  I recommend beginning a few cycles of FOLFIRI with Erbitux (expresses KRAS wild type).  We spent more than half the time discussing logistics and side effects of therapy.  He was given literature to review.  The patient is keen to proceed and  begins treatment on June 20, 2012, as he would like to go on temporary disability at this time.    ______________________________ Laurice Record, M.D. LIO/MEDQ  D:  06/02/2012  T:  06/03/2012  Job:  [id number]

## 2012-06-03 NOTE — Telephone Encounter (Signed)
All appts made and printed,pt stated that he would stop by to get a sch later

## 2012-06-16 ENCOUNTER — Other Ambulatory Visit: Payer: Self-pay | Admitting: Hematology and Oncology

## 2012-06-17 ENCOUNTER — Other Ambulatory Visit (HOSPITAL_BASED_OUTPATIENT_CLINIC_OR_DEPARTMENT_OTHER): Payer: BC Managed Care – PPO | Admitting: Lab

## 2012-06-17 ENCOUNTER — Ambulatory Visit (HOSPITAL_BASED_OUTPATIENT_CLINIC_OR_DEPARTMENT_OTHER): Payer: BC Managed Care – PPO

## 2012-06-17 ENCOUNTER — Encounter: Payer: Self-pay | Admitting: Hematology and Oncology

## 2012-06-17 VITALS — BP 135/81 | HR 99 | Temp 98.5°F

## 2012-06-17 DIAGNOSIS — Z5112 Encounter for antineoplastic immunotherapy: Secondary | ICD-10-CM

## 2012-06-17 DIAGNOSIS — Z5111 Encounter for antineoplastic chemotherapy: Secondary | ICD-10-CM

## 2012-06-17 DIAGNOSIS — C20 Malignant neoplasm of rectum: Secondary | ICD-10-CM

## 2012-06-17 LAB — CBC WITH DIFFERENTIAL/PLATELET
Basophils Absolute: 0 10*3/uL (ref 0.0–0.1)
Eosinophils Absolute: 0.1 10*3/uL (ref 0.0–0.5)
HCT: 40.5 % (ref 38.4–49.9)
HGB: 14 g/dL (ref 13.0–17.1)
MONO#: 0.4 10*3/uL (ref 0.1–0.9)
NEUT#: 2.8 10*3/uL (ref 1.5–6.5)
NEUT%: 73.5 % (ref 39.0–75.0)
RDW: 15.2 % — ABNORMAL HIGH (ref 11.0–14.6)
lymph#: 0.5 10*3/uL — ABNORMAL LOW (ref 0.9–3.3)

## 2012-06-17 MED ORDER — IRINOTECAN HCL CHEMO INJECTION 100 MG/5ML
183.0000 mg/m2 | Freq: Once | INTRAVENOUS | Status: AC
  Administered 2012-06-17: 400 mg via INTRAVENOUS
  Filled 2012-06-17: qty 20

## 2012-06-17 MED ORDER — ONDANSETRON 16 MG/50ML IVPB (CHCC)
16.0000 mg | Freq: Once | INTRAVENOUS | Status: AC
  Administered 2012-06-17: 16 mg via INTRAVENOUS

## 2012-06-17 MED ORDER — FLUOROURACIL CHEMO INJECTION 2.5 GM/50ML
400.0000 mg/m2 | Freq: Once | INTRAVENOUS | Status: AC
  Administered 2012-06-17: 900 mg via INTRAVENOUS
  Filled 2012-06-17: qty 18

## 2012-06-17 MED ORDER — DEXAMETHASONE SODIUM PHOSPHATE 4 MG/ML IJ SOLN
20.0000 mg | Freq: Once | INTRAMUSCULAR | Status: AC
  Administered 2012-06-17: 20 mg via INTRAVENOUS

## 2012-06-17 MED ORDER — LORAZEPAM 2 MG/ML IJ SOLN
0.5000 mg | Freq: Once | INTRAMUSCULAR | Status: AC
  Administered 2012-06-17: 0.5 mg via INTRAVENOUS

## 2012-06-17 MED ORDER — DEXAMETHASONE SODIUM PHOSPHATE 10 MG/ML IJ SOLN
10.0000 mg | Freq: Once | INTRAMUSCULAR | Status: AC
  Administered 2012-06-17: 10 mg via INTRAVENOUS

## 2012-06-17 MED ORDER — SODIUM CHLORIDE 0.9 % IV SOLN
Freq: Once | INTRAVENOUS | Status: AC
Start: 2012-06-17 — End: 2012-06-17
  Administered 2012-06-17: 09:00:00 via INTRAVENOUS

## 2012-06-17 MED ORDER — SODIUM CHLORIDE 0.9 % IV SOLN
2400.0000 mg/m2 | INTRAVENOUS | Status: DC
  Administered 2012-06-17: 5250 mg via INTRAVENOUS
  Filled 2012-06-17: qty 105

## 2012-06-17 MED ORDER — CETUXIMAB CHEMO IV INJECTION 200 MG/100ML
500.0000 mg/m2 | Freq: Once | INTRAVENOUS | Status: AC
  Administered 2012-06-17: 1100 mg via INTRAVENOUS
  Filled 2012-06-17: qty 550

## 2012-06-17 MED ORDER — ATROPINE SULFATE 1 MG/ML IJ SOLN
0.5000 mg | Freq: Once | INTRAMUSCULAR | Status: AC | PRN
  Administered 2012-06-17: 0.5 mg via INTRAVENOUS

## 2012-06-17 MED ORDER — LEUCOVORIN CALCIUM INJECTION 350 MG
402.0000 mg/m2 | Freq: Once | INTRAVENOUS | Status: AC
  Administered 2012-06-17: 880 mg via INTRAVENOUS
  Filled 2012-06-17: qty 44

## 2012-06-17 MED ORDER — DIPHENHYDRAMINE HCL 50 MG/ML IJ SOLN
50.0000 mg | Freq: Once | INTRAMUSCULAR | Status: AC
  Administered 2012-06-17: 50 mg via INTRAVENOUS

## 2012-06-17 MED ORDER — ATROPINE SULFATE 1 MG/ML IJ SOLN
0.5000 mg | Freq: Once | INTRAMUSCULAR | Status: AC
  Administered 2012-06-17: 0.5 mg via INTRAVENOUS

## 2012-06-17 NOTE — Progress Notes (Signed)
1550---leucovorin/ irinotecan restarted, pt is resting comfortably in bed.  No complaints.  SLJ

## 2012-06-17 NOTE — Progress Notes (Signed)
NOT REQUIRED FOR FOLFIRI/ERBITUX REF #[id number] PER RAPHAEL.

## 2012-06-17 NOTE — Patient Instructions (Signed)
[facility] Discharge Instructions for Patients Receiving Chemotherapy  Today you received the following chemotherapy agents: 5FU, Leucovorin, Camptosar, Erbitux  To help prevent nausea and vomiting after your treatment, we encourage you to take your nausea medication as directed by your MD.  If you develop nausea and vomiting that is not controlled by your nausea medication, call the clinic. If it is after clinic hours your family physician or the after hours number for the clinic or go to the Emergency Department.   BELOW ARE SYMPTOMS THAT SHOULD BE REPORTED IMMEDIATELY:  *FEVER GREATER THAN 100.5 F  *CHILLS WITH OR WITHOUT FEVER  NAUSEA AND VOMITING THAT IS NOT CONTROLLED WITH YOUR NAUSEA MEDICATION  *UNUSUAL SHORTNESS OF BREATH  *UNUSUAL BRUISING OR BLEEDING  TENDERNESS IN MOUTH AND THROAT WITH OR WITHOUT PRESENCE OF ULCERS  *URINARY PROBLEMS  *BOWEL PROBLEMS  UNUSUAL RASH Items with * indicate a potential emergency and should be followed up as soon as possible.  One of the nurses will contact you 24 hours after your treatment. Please let the nurse know about any problems that you may have experienced. Feel free to call the clinic you have any questions or concerns. The clinic phone number is [phone].

## 2012-06-17 NOTE — Progress Notes (Signed)
[id number] post -Erbitux observation: pt tolerated well w/o complaints, no s/s of reaction.

## 2012-06-17 NOTE — Progress Notes (Signed)
1422- Pt c/o abdominal cramping.  Camptosar and leucovorin stopped and 0.5 mg atropine given IV push.  Pt c/o "not feeling good."  Pt states he feels slightly dizzy. Will notify MD.  [phone]- Orders received from Dr. Dalene Carrow, verbal order received and read back to administer another 0.5 mg atropine IV, 0.5 mg ativan IV, and 10 mg decadron IV and then re-assess pt.    1520- Pt reports cramping has subsided and he is feeling some better, just only a little lightheaded.  Will continue to monitor pt before restarting chemo.

## 2012-06-18 ENCOUNTER — Telehealth: Payer: Self-pay | Admitting: *Deleted

## 2012-06-18 NOTE — Telephone Encounter (Signed)
[name] says he feels tired and a little weak today, otherwise is fine.  Denies questions or complaints.  He does report having to change batteries in the pump this morning.  Asked that he call the 1-800 number on the side of the pump to report this to the company.

## 2012-06-18 NOTE — Telephone Encounter (Signed)
Message copied by Augusto Garbe on Thu Jun 18, 2012  1:32 PM ------      Message from: Faith Rogue F      Created: Wed Jun 17, 2012 10:16 AM      Regarding: chemo f/u call       New drugs since 2012- Folfiri, Erbitux            Dr. Dalene Carrow

## 2012-06-19 ENCOUNTER — Ambulatory Visit (HOSPITAL_BASED_OUTPATIENT_CLINIC_OR_DEPARTMENT_OTHER): Payer: BC Managed Care – PPO

## 2012-06-19 ENCOUNTER — Other Ambulatory Visit: Payer: Self-pay | Admitting: *Deleted

## 2012-06-19 VITALS — BP 121/63 | HR 67 | Temp 97.3°F

## 2012-06-19 DIAGNOSIS — C20 Malignant neoplasm of rectum: Secondary | ICD-10-CM

## 2012-06-19 DIAGNOSIS — C774 Secondary and unspecified malignant neoplasm of inguinal and lower limb lymph nodes: Secondary | ICD-10-CM

## 2012-06-19 MED ORDER — SODIUM CHLORIDE 0.9 % IJ SOLN
10.0000 mL | INTRAMUSCULAR | Status: DC | PRN
  Administered 2012-06-19: 10 mL
  Filled 2012-06-19: qty 10

## 2012-06-19 MED ORDER — ONDANSETRON HCL 8 MG PO TABS: 8.0000 mg | ORAL_TABLET | Freq: Two times a day (BID) | ORAL | Status: DC | PRN

## 2012-06-19 MED ORDER — ONDANSETRON HCL 8 MG PO TABS
8.0000 mg | ORAL_TABLET | Freq: Once | ORAL | Status: AC
  Administered 2012-06-19: 8 mg via ORAL

## 2012-06-19 MED ORDER — PEGFILGRASTIM INJECTION 6 MG/0.6ML
6.0000 mg | Freq: Once | SUBCUTANEOUS | Status: AC
  Administered 2012-06-19: 6 mg via SUBCUTANEOUS
  Filled 2012-06-19: qty 0.6

## 2012-06-19 MED ORDER — HEPARIN SOD (PORK) LOCK FLUSH 100 UNIT/ML IV SOLN
500.0000 [IU] | Freq: Once | INTRAVENOUS | Status: AC | PRN
  Administered 2012-06-19: 500 [IU]
  Filled 2012-06-19: qty 5

## 2012-06-19 NOTE — Patient Instructions (Signed)
Call MD for problems.  Pick up prescription from [facility]

## 2012-06-19 NOTE — Progress Notes (Signed)
Patient to flush room for pump dc.  Complains of nausea and asking for po Zofran..  He has taken the Compazine without relief and has tried for three days to get his Zofran from his pharmacy because they have been out of stock.  Talked with Dr. Dalene Carrow and order for Zofran 8mg  po now and Zofran prescription called in to [facility].

## 2012-06-23 ENCOUNTER — Telehealth: Payer: Self-pay | Admitting: Nurse Practitioner

## 2012-06-23 ENCOUNTER — Encounter (HOSPITAL_COMMUNITY): Payer: Self-pay | Admitting: Internal Medicine

## 2012-06-23 ENCOUNTER — Inpatient Hospital Stay (HOSPITAL_COMMUNITY)
Admission: EM | Admit: 2012-06-23 | Discharge: 2012-06-25 | DRG: 813 | Disposition: A | Discharge status: Home / Self Care | Payer: BC Managed Care – PPO | Attending: Internal Medicine | Admitting: Internal Medicine

## 2012-06-23 DIAGNOSIS — Y92009 Unspecified place in unspecified non-institutional (private) residence as the place of occurrence of the external cause: Secondary | ICD-10-CM

## 2012-06-23 DIAGNOSIS — D61818 Other pancytopenia: Secondary | ICD-10-CM | POA: Diagnosis present

## 2012-06-23 DIAGNOSIS — Z880 Allergy status to penicillin: Secondary | ICD-10-CM

## 2012-06-23 DIAGNOSIS — E785 Hyperlipidemia, unspecified: Secondary | ICD-10-CM

## 2012-06-23 DIAGNOSIS — A088 Other specified intestinal infections: Principal | ICD-10-CM | POA: Diagnosis present

## 2012-06-23 DIAGNOSIS — Z6828 Body mass index (BMI) 28.0-28.9, adult: Secondary | ICD-10-CM

## 2012-06-23 DIAGNOSIS — R630 Anorexia: Secondary | ICD-10-CM | POA: Diagnosis present

## 2012-06-23 DIAGNOSIS — I951 Orthostatic hypotension: Secondary | ICD-10-CM

## 2012-06-23 DIAGNOSIS — F32A Depression, unspecified: Secondary | ICD-10-CM

## 2012-06-23 DIAGNOSIS — C20 Malignant neoplasm of rectum: Secondary | ICD-10-CM

## 2012-06-23 DIAGNOSIS — E876 Hypokalemia: Secondary | ICD-10-CM | POA: Diagnosis present

## 2012-06-23 DIAGNOSIS — Z923 Personal history of irradiation: Secondary | ICD-10-CM

## 2012-06-23 DIAGNOSIS — T451X5A Adverse effect of antineoplastic and immunosuppressive drugs, initial encounter: Secondary | ICD-10-CM | POA: Diagnosis present

## 2012-06-23 DIAGNOSIS — Z9221 Personal history of antineoplastic chemotherapy: Secondary | ICD-10-CM

## 2012-06-23 DIAGNOSIS — R197 Diarrhea, unspecified: Secondary | ICD-10-CM

## 2012-06-23 DIAGNOSIS — Z87891 Personal history of nicotine dependence: Secondary | ICD-10-CM

## 2012-06-23 DIAGNOSIS — F3289 Other specified depressive episodes: Secondary | ICD-10-CM | POA: Diagnosis present

## 2012-06-23 DIAGNOSIS — K121 Other forms of stomatitis: Secondary | ICD-10-CM | POA: Diagnosis present

## 2012-06-23 DIAGNOSIS — Z9089 Acquired absence of other organs: Secondary | ICD-10-CM

## 2012-06-23 DIAGNOSIS — F329 Major depressive disorder, single episode, unspecified: Secondary | ICD-10-CM

## 2012-06-23 DIAGNOSIS — K1239 Other oral mucositis (ulcerative): Secondary | ICD-10-CM | POA: Diagnosis present

## 2012-06-23 DIAGNOSIS — C772 Secondary and unspecified malignant neoplasm of intra-abdominal lymph nodes: Secondary | ICD-10-CM | POA: Diagnosis present

## 2012-06-23 LAB — COMPREHENSIVE METABOLIC PANEL
ALT: 24 U/L (ref 0–53)
AST: 16 U/L (ref 0–37)
Albumin: 3.6 g/dL (ref 3.5–5.2)
CO2: 22 mEq/L (ref 19–32)
Calcium: 8.4 mg/dL (ref 8.4–10.5)
Creatinine, Ser: 0.82 mg/dL (ref 0.50–1.35)
GFR calc non Af Amer: >90 mL/min (ref >90)
Sodium: 135 mEq/L (ref 135–145)
Total Protein: 6.4 g/dL (ref 6.0–8.3)

## 2012-06-23 LAB — CBC WITH DIFFERENTIAL/PLATELET
Basophils Absolute: 0 10*3/uL (ref 0.0–0.1)
Basophils Relative: 0 % (ref 0–1)
Eosinophils Absolute: 0.1 10*3/uL (ref 0.0–0.7)
Hemoglobin: 13.6 g/dL (ref 13.0–17.0)
Lymphocytes Relative: 8 % — ABNORMAL LOW (ref 12–46)
MCH: 30 pg (ref 26.0–34.0)
MCHC: 36.4 g/dL — ABNORMAL HIGH (ref 30.0–36.0)
Monocytes Absolute: 0.7 10*3/uL (ref 0.1–1.0)
Neutrophils Relative %: 74 % (ref 43–77)
Platelets: 87 10*3/uL — ABNORMAL LOW (ref 150–400)
WBC Morphology: INCREASED

## 2012-06-23 LAB — URINALYSIS, ROUTINE W REFLEX MICROSCOPIC
Glucose, UA: NEGATIVE mg/dL
Leukocytes, UA: NEGATIVE
Protein, ur: NEGATIVE mg/dL
Specific Gravity, Urine: 1.035 — ABNORMAL HIGH (ref 1.005–1.030)

## 2012-06-23 LAB — URINE MICROSCOPIC-ADD ON

## 2012-06-23 MED ORDER — OXYCODONE HCL 5 MG PO TABS: 5.0000 mg | ORAL_TABLET | ORAL | Status: DC | PRN

## 2012-06-23 MED ORDER — PROCHLORPERAZINE MALEATE 10 MG PO TABS
10.0000 mg | ORAL_TABLET | Freq: Four times a day (QID) | ORAL | Status: DC | PRN
  Filled 2012-06-23: qty 1

## 2012-06-23 MED ORDER — HYDROMORPHONE HCL PF 1 MG/ML IJ SOLN
1.0000 mg | INTRAMUSCULAR | Status: DC | PRN
  Administered 2012-06-23 – 2012-06-24 (×5): 1 mg via INTRAVENOUS
  Filled 2012-06-23 (×5): qty 1

## 2012-06-23 MED ORDER — HEPARIN SODIUM (PORCINE) 5000 UNIT/ML IJ SOLN
5000.0000 [IU] | Freq: Three times a day (TID) | INTRAMUSCULAR | Status: DC
  Administered 2012-06-23 – 2012-06-25 (×5): 5000 [IU] via SUBCUTANEOUS
  Filled 2012-06-23 (×8): qty 1

## 2012-06-23 MED ORDER — SODIUM CHLORIDE 0.9 % IV SOLN
1000.0000 mL | Freq: Once | INTRAVENOUS | Status: AC
  Administered 2012-06-23: 1000 mL via INTRAVENOUS

## 2012-06-23 MED ORDER — LOPERAMIDE HCL 2 MG PO CAPS: 2.0000 mg | ORAL_CAPSULE | Freq: Four times a day (QID) | ORAL | Status: DC | PRN

## 2012-06-23 MED ORDER — SODIUM CHLORIDE 0.9 % IV BOLUS (SEPSIS)
1000.0000 mL | Freq: Once | INTRAVENOUS | Status: AC
  Administered 2012-06-23: 1000 mL via INTRAVENOUS

## 2012-06-23 MED ORDER — ONDANSETRON HCL 4 MG PO TABS: 8.0000 mg | ORAL_TABLET | Freq: Four times a day (QID) | ORAL | Status: DC | PRN

## 2012-06-23 MED ORDER — LOPERAMIDE HCL 2 MG PO CAPS
2.0000 mg | ORAL_CAPSULE | ORAL | Status: DC | PRN
  Administered 2012-06-23 – 2012-06-24 (×3): 2 mg via ORAL
  Filled 2012-06-23 (×3): qty 1

## 2012-06-23 MED ORDER — ONDANSETRON HCL 4 MG/2ML IJ SOLN
4.0000 mg | Freq: Four times a day (QID) | INTRAMUSCULAR | Status: DC | PRN
  Administered 2012-06-24: 4 mg via INTRAVENOUS
  Filled 2012-06-23: qty 2

## 2012-06-23 MED ORDER — POTASSIUM CHLORIDE CRYS ER 20 MEQ PO TBCR
40.0000 meq | EXTENDED_RELEASE_TABLET | Freq: Once | ORAL | Status: AC
  Administered 2012-06-23: 40 meq via ORAL
  Filled 2012-06-23: qty 2

## 2012-06-23 MED ORDER — ATORVASTATIN CALCIUM 10 MG PO TABS
10.0000 mg | ORAL_TABLET | Freq: Every day | ORAL | Status: DC
  Administered 2012-06-23 – 2012-06-24 (×2): 10 mg via ORAL
  Filled 2012-06-23 (×3): qty 1

## 2012-06-23 MED ORDER — ALUM & MAG HYDROXIDE-SIMETH 200-200-20 MG/5ML PO SUSP: 30.0000 mL | Freq: Four times a day (QID) | ORAL | Status: DC | PRN

## 2012-06-23 MED ORDER — POTASSIUM CHLORIDE CRYS ER 20 MEQ PO TBCR
40.0000 meq | EXTENDED_RELEASE_TABLET | Freq: Two times a day (BID) | ORAL | Status: DC
  Administered 2012-06-23: 40 meq via ORAL
  Filled 2012-06-23 (×2): qty 2

## 2012-06-23 MED ORDER — ACETAMINOPHEN 325 MG PO TABS: 650.0000 mg | ORAL_TABLET | Freq: Four times a day (QID) | ORAL | Status: DC | PRN

## 2012-06-23 MED ORDER — SODIUM CHLORIDE 0.9 % IV SOLN
INTRAVENOUS | Status: DC
  Administered 2012-06-23: 23:00:00 via INTRAVENOUS

## 2012-06-23 MED ORDER — ONDANSETRON HCL 4 MG PO TABS: 8.0000 mg | ORAL_TABLET | Freq: Three times a day (TID) | ORAL | Status: DC | PRN

## 2012-06-23 MED ORDER — DEXAMETHASONE 4 MG PO TABS: 4.0000 mg | ORAL_TABLET | Freq: Two times a day (BID) | ORAL | Status: DC

## 2012-06-23 MED ORDER — ONDANSETRON HCL 4 MG/2ML IJ SOLN
INTRAMUSCULAR | Status: AC
  Administered 2012-06-23: 4 mg via INTRAVENOUS
  Filled 2012-06-23: qty 2

## 2012-06-23 MED ORDER — DICYCLOMINE HCL 10 MG PO CAPS
10.0000 mg | ORAL_CAPSULE | Freq: Three times a day (TID) | ORAL | Status: DC
  Administered 2012-06-23 – 2012-06-25 (×5): 10 mg via ORAL
  Filled 2012-06-23 (×7): qty 1

## 2012-06-23 MED ORDER — ACETAMINOPHEN 650 MG RE SUPP: 650.0000 mg | Freq: Four times a day (QID) | RECTAL | Status: DC | PRN

## 2012-06-23 NOTE — H&P (Signed)
[facility] History and Physical  SHERWIN HOLLINGSHED WGN:[id number] DOB: [birth year] DOA: 06/23/2012  Referring physician: Dr. Jeraldine Loots PCP: Delorse Lek, MD  Specialists: Dr. Raynelle Bring  Chief Complaint: severe diarrhea  HPI: [name] is a 46 y.o. male  Past medical history of rectal cancer status post surgery with recurrence on fourth cycle of chemotherapy, but this last chemotherapy last week. They comes in for severe diarrhea and dizzy upon standing. He relates this started 3 days prior to admission and has not improved. He has not had any vomiting but has had some nausea has only been drinking minimally and has remained anorexic. He relates 3 of his family members have an acute diarrheal episode that lasted 3 days. He relates no fever, chills blood in her stools, no hematemesis. He relates some abdominal pain in the suprapubic area no rebound or guarding. It improves after bowel movements.  Review of Systems: The patient denies anorexia, fever, weight loss,, vision loss, decreased hearing, hoarseness, chest pain, syncope, dyspnea on exertion, peripheral edema, balance deficits, hemoptysis,, melena, hematochezia, severe indigestion/heartburn, hematuria, incontinence, genital sores, muscle weakness, suspicious skin lesions, transient blindness, difficulty walking, depression, unusual weight change, abnormal bleeding, enlarged lymph nodes, angioedema, and breast masses.    Past Medical History  Diagnosis Date  . Gout   . Hyperlipemia   . Hemorrhoid   . Colorectal cancer   . S/P radiation therapy 04/16/11 - 05/23/11  . Frequent bowel movements Noted 02/19/12  . Pulmonary nodules 02/19/12    small-stable  . Hepatic steatosis   . Lymphadenopathy, retroperitoneal Pet Scan 02/13/12    aortacaval space  . Status post chemotherapy 04/16/11 - 05/21/11    6 Cycles Oxaliplatin based therapy with 2 cycles of FOLFOX and 4 cycles of XELOX  . Depressed Noted 02/19/12    Dr. Dalene Carrow  . Testicular  atrophy     right  . ED (erectile dysfunction)    Past Surgical History  Procedure Date  . Proctectomy 08/16/2010     low anterior resection,coloanal anastomosis and ileostomy  . Inguinal hernia repair     RIGHT  . Ileostomy     reversal 06/28/11  . Ileostomy reversal   . Cholecystectomy   . Portacath placement   . Sacral nerve stimulator placement 12/13/11    Decrease incontinence   Social History:  reports that he quit smoking about 14 years ago. His smoking use included Cigarettes. He has a 18 pack-year smoking history. He has never used smokeless tobacco. He reports that he drinks alcohol. He reports that he uses illicit drugs (Marijuana). Visit all of it can perform all his ADLs  Allergies  Allergen Reactions  . Penicillins Itching    Intermittent reaction per pt    Family History  Problem Relation Age of Onset  . Cancer Maternal Grandfather     throat  . Cancer Cousin     breast, maternal  . Cancer Maternal Grandmother     unknown primary  . Other Mother   . Other Father     Prior to Admission medications   Medication Sig Start Date End Date Taking? Authorizing Provider  dexamethasone (DECADRON) 4 MG tablet Take 4 mg by mouth 2 (two) times daily with a meal. Takes the day following chemo for three days   Yes Historical Provider, MD  dicyclomine (BENTYL) 10 MG capsule Take 10 mg by mouth 3 (three) times daily.    Yes Historical Provider, MD  GLUCOSAMINE HCL PO Take 1 tablet by mouth  daily.   Yes Historical Provider, MD  loperamide (IMODIUM) 2 MG capsule Take 2 mg by mouth 4 (four) times daily as needed. For diarrhea   Yes Historical Provider, MD  ondansetron (ZOFRAN) 8 MG tablet 8 mg. Take two times a day starting the day after chemo for 3 days. Then take two times a day as needed for nausea or vomiting. 06/02/12 06/23/12 Yes Lauretta I Odogwu, MD  ondansetron (ZOFRAN) 8 MG tablet Take 8 mg by mouth every 12 (twelve) hours as needed. Take 8 mg po BID  X 3  Days after  chemo , and as needed for nausea. 06/19/12  Yes Lauretta I Odogwu, MD  oxyCODONE (OXY IR/ROXICODONE) 5 MG immediate release tablet Take 5-15 mg by mouth every 4 (four) hours as needed. For pain 03/27/12  Yes [name], MD  prochlorperazine (COMPAZINE) 10 MG tablet Take 10 mg by mouth every 6 (six) hours as needed. Nausea 03/24/12  Yes Lauretta I Odogwu, MD  Pyridoxine HCl (VITAMIN B-6 PO) Take 1 tablet by mouth daily.   Yes Historical Provider, MD  rosuvastatin (CRESTOR) 5 MG tablet Take 5 mg by mouth at bedtime.   Yes Historical Provider, MD   Physical Exam: Filed Vitals:   06/23/12 1640 06/23/12 1831  BP: 122/91 128/73  Pulse: 101   Temp: 99.4 F (37.4 C)   TempSrc: Oral   Resp:  20  SpO2: 100% 99%     General:  Awake alert and oriented x3 no acute distress  Eyes: Anicteric  ENT: Dry mucous membranes with ulcer on the left lower lip inside his mouth  Neck: No JVD  Cardiovascular: Regular rate and rhythm with positive S1 and S2 no murmurs rubs gallops  Respiratory: Good air movement clear to auscultation  Abdomen: Positive bowel sounds tender in the suprapubic area in the left lower quadrant no rebound or guarding.  Skin: He has denies macular papular lesions phase and his shoulder that started about a week ago.  Musculoskeletal: Intact  Psychiatric: Appropriate  Neurologic: Awake alert and oriented x4 coherent for language  Labs on Admission:  Basic Metabolic Panel:  Lab 06/23/12 1610  NA 135  K 3.2*  CL 103  CO2 22  GLUCOSE 113*  BUN 14  CREATININE 0.82  CALCIUM 8.4  MG --  PHOS --   Liver Function Tests:  Lab 06/23/12 1640  AST 16  ALT 24  ALKPHOS 93  BILITOT 1.0  PROT 6.4  ALBUMIN 3.6    Lab 06/23/12 1640  LIPASE 34  AMYLASE --   No results found for this basename: AMMONIA:5 in the last 168 hours CBC:  Lab 06/23/12 1640 06/17/12 0844  WBC 4.8 3.8*  NEUTROABS 3.6 2.8  HGB 13.6 14.0  HCT 37.4* 40.5  MCV 82.4 86.4  PLT 87* 143    Cardiac Enzymes: No results found for this basename: CKTOTAL:5,CKMB:5,CKMBINDEX:5,TROPONINI:5 in the last 168 hours  BNP (last 3 results) No results found for this basename: PROBNP:3 in the last 8760 hours CBG: No results found for this basename: GLUCAP:5 in the last 168 hours  Radiological Exams on Admission: No results found.  EKG: None  Assessment/Plan Acute diarrhea - No blood in stools, no fever nor leukocytosis. Family is really insisting for the patient to be observed in the hospital. I will go ahead and continue his loperamide from home. He relates this weakness has gotten worse. This most likely chemotherapy induced (stomatitis). As he has a small ulcer in his mouth. We'll  go ahead and give him aggressive IV fluids. The ED has only given him a liter of normal saline, we'll go ahead and give him 2 additional bolus of normal saline, then continue on 25 cc an hour. Monitor his electrolytes especially his potassium and replete as tolerated.  Orthostatic hypotension: - This most likely simply to severe diarrhea. His renal function is at baseline. Will continue IV fluids aggressively continue a regular diet. Avoid dairy products. -  check a urinary sodium and urinary creatinine   Rectal cancer -We'll inform Dr. Dorinda Hill this admission.   Code Status: Full (must indicate code status--if unknown or must be presumed, indicate so) Family Communication: Wife at bedside who was is a Engineer, civil (consulting) at [employer] Disposition Plan: observation  Time spent: [address] Rosine Beat [facility] Pager [phone] If 7PM-7AM, please contact night-coverage [email or web address] Password [email or web address] 06/23/2012, 7:00 PM

## 2012-06-23 NOTE — ED Notes (Addendum)
Pt observed having large emesis while on bedside comode having diarrhea stool medicated per order Dr Jeraldine Loots Zofran 4 mg iv

## 2012-06-23 NOTE — ED Notes (Signed)
Attempted to call report.  Nurse unavailable. 

## 2012-06-23 NOTE — ED Provider Notes (Signed)
History     CSN: [id number]  Arrival date & time 06/23/12  1528   First MD Initiated Contact with Patient 06/23/12 1534      Chief Complaint  Patient presents with  . Diarrhea    (Consider location/radiation/quality/duration/timing/severity/associated sxs/prior treatment) HPI  This patient with rectal cancer, now presents with concerns of ongoing diarrhea.  Symptoms began approximately 3 days ago.  Since onset symptoms have been persistent, worsening.  No relief with Imodium, or anything else.  No clear precipitant.  No clear exacerbating factors.  The patient is concurrently anorexic, week, dizzy. The patient's rectal cancer is recurrent, with a recent initiation of new chemotherapy.  He denies ongoing fever, complains of chills. He denies confusion, disorientation, syncope, chest pain.  He does endorse mild dyspnea.  Past Medical History  Diagnosis Date  . Gout   . Hyperlipemia   . Hemorrhoid   . Colorectal cancer   . S/P radiation therapy 04/16/11 - 05/23/11  . Frequent bowel movements Noted 02/19/12  . Pulmonary nodules 02/19/12    small-stable  . Hepatic steatosis   . Lymphadenopathy, retroperitoneal Pet Scan 02/13/12    aortacaval space  . Status post chemotherapy 04/16/11 - 05/21/11    6 Cycles Oxaliplatin based therapy with 2 cycles of FOLFOX and 4 cycles of XELOX  . Depressed Noted 02/19/12    Dr. Dalene Carrow  . Testicular atrophy     right  . ED (erectile dysfunction)     Past Surgical History  Procedure Date  . Proctectomy 08/16/2010     low anterior resection,coloanal anastomosis and ileostomy  . Inguinal hernia repair     RIGHT  . Ileostomy     reversal 06/28/11  . Ileostomy reversal   . Cholecystectomy   . Portacath placement   . Sacral nerve stimulator placement 12/13/11    Decrease incontinence    Family History  Problem Relation Age of Onset  . Cancer Maternal Grandfather     throat  . Cancer Cousin     breast, maternal  . Cancer Maternal  Grandmother     unknown primary    History  Substance Use Topics  . Smoking status: Former Smoker -- 1.0 packs/day for 18 years    Types: Cigarettes    Quit date: 10/09/1997  . Smokeless tobacco: Never Used  . Alcohol Use: Yes     Comment: occasionally      Review of Systems  Constitutional:       Per HPI, otherwise negative  HENT:       Per HPI, otherwise negative  Eyes: Negative.   Respiratory:       Per HPI, otherwise negative  Cardiovascular:       Per HPI, otherwise negative  Gastrointestinal: Negative for vomiting.  Genitourinary: Negative.   Musculoskeletal:       Per HPI, otherwise negative  Skin: Negative.   Neurological: Negative for syncope.    Allergies  Penicillins  Home Medications   Current Outpatient Rx  Name  Route  Sig  Dispense  Refill  . DEXAMETHASONE 4 MG PO TABS   Oral   Take 4 mg by mouth 2 (two) times daily with a meal. Takes the day following chemo for three days         . DICYCLOMINE HCL 10 MG PO CAPS   Oral   Take 10 mg by mouth 3 (three) times daily.          Marland Kitchen GLUCOSAMINE HCL PO   Oral  Take 1 tablet by mouth daily.         Marland Kitchen LOPERAMIDE HCL 2 MG PO CAPS   Oral   Take 2 mg by mouth 4 (four) times daily as needed. For diarrhea         . ONDANSETRON HCL 8 MG PO TABS      8 mg. Take two times a day starting the day after chemo for 3 days. Then take two times a day as needed for nausea or vomiting.         Marland Kitchen ONDANSETRON HCL 8 MG PO TABS   Oral   Take 8 mg by mouth every 12 (twelve) hours as needed. Take 8 mg po BID  X 3  Days after chemo , and as needed for nausea.         . OXYCODONE HCL 5 MG PO TABS   Oral   Take 5-15 mg by mouth every 4 (four) hours as needed. For pain         . PROCHLORPERAZINE MALEATE 10 MG PO TABS   Oral   Take 10 mg by mouth every 6 (six) hours as needed. Nausea         . VITAMIN B-6 PO   Oral   Take 1 tablet by mouth daily.         Marland Kitchen ROSUVASTATIN CALCIUM 5 MG PO TABS    Oral   Take 5 mg by mouth at bedtime.           BP 122/91  Pulse 101  Temp 99.4 F (37.4 C) (Oral)  SpO2 100%  Physical Exam  Nursing note and vitals reviewed. Constitutional: He is oriented to person, place, and time. He appears well-developed. He appears distressed.       Patient is on a portable commode, throughout his ED visit  HENT:  Head: Normocephalic and atraumatic.  Eyes: Conjunctivae normal and EOM are normal.  Cardiovascular: Normal rate and regular rhythm.   Pulmonary/Chest: Effort normal. No stridor. No respiratory distress.  Abdominal: Normal appearance.  Musculoskeletal: He exhibits no edema.  Neurological: He is alert and oriented to person, place, and time.  Skin: Skin is warm. He is diaphoretic.  Psychiatric: He has a normal mood and affect.    ED Course  Procedures (including critical care time)  Labs Reviewed  CBC WITH DIFFERENTIAL - Abnormal; Notable for the following:    HCT 37.4 (*)     MCHC 36.4 (*)     All other components within normal limits  COMPREHENSIVE METABOLIC PANEL - Abnormal; Notable for the following:    Potassium 3.2 (*)     Glucose, Bld 113 (*)     All other components within normal limits  LIPASE, BLOOD  URINALYSIS, ROUTINE W REFLEX MICROSCOPIC   No results found.   No diagnosis found.   Update: Patient remains on commode - copious watery diarrhea produced 6:26 PM Patient remains on the commode.  He requests pain meds.  We discussed all results thus far.  Labs notable for mild hypokalemia  MDM  This patient with colorectal cancer presents with ongoing diarrhea.  Notably, throughout the patient's entire emergency department stay he remained largely on the commode.  The patient was intolerant of by mouth, with consistent artery diarrhea.  Absent fever, leukocytosis, there is low suspicion for significant infection, though the patient is immunosuppressed with recent chemotherapy, and this is a consideration.  Given the  persistency of diarrhea, by mouth intolerance, the discomfort, and  his mild polypoid abnormal was placed in observation for continued rehydration.  Gerhard Munch, MD 06/23/12 2356

## 2012-06-23 NOTE — ED Notes (Signed)
Pt continuing to have water like stools on bedside commode.

## 2012-06-23 NOTE — ED Notes (Signed)
Colon cancer patient who had chemo on Wednesday and a bone marrow booster on Friday.  He started with diarrhea, nausea, SOB, dizziness, and weakness three days ago and it has worsened until today when he decided to come in.  Pt is stage 4 cancer.

## 2012-06-23 NOTE — Telephone Encounter (Signed)
Received message from patient requesting call.  Patient audibly short of breath. Stated he is dizzy and having trouble breathing.  States he is having severe, constant diarrhea.  RN instructed patient to call 911 immediately for emergency care.  Patient stated he was waiting for his father to come and get him.  RN advised patient not to wait, to call 911 immediately.  Patient stated he would.  This RN also called 911 and ensured patient had called.  911 dispatcher stated patient was on line with another dispatcher at that time.

## 2012-06-23 NOTE — ED Notes (Signed)
MD at bedside. 

## 2012-06-23 NOTE — ED Notes (Signed)
[id number]<JY> Expected date:<BR> Expected time:<BR> Means of arrival:<BR> Comments:<BR> Colon CA/diarhea

## 2012-06-24 ENCOUNTER — Telehealth: Payer: Self-pay | Admitting: Hematology and Oncology

## 2012-06-24 ENCOUNTER — Telehealth: Payer: Self-pay | Admitting: Nurse Practitioner

## 2012-06-24 ENCOUNTER — Other Ambulatory Visit: Payer: Self-pay | Admitting: Nurse Practitioner

## 2012-06-24 DIAGNOSIS — D61818 Other pancytopenia: Secondary | ICD-10-CM | POA: Diagnosis present

## 2012-06-24 DIAGNOSIS — E876 Hypokalemia: Secondary | ICD-10-CM | POA: Diagnosis present

## 2012-06-24 DIAGNOSIS — K121 Other forms of stomatitis: Secondary | ICD-10-CM | POA: Diagnosis present

## 2012-06-24 LAB — COMPREHENSIVE METABOLIC PANEL
Albumin: 2.7 g/dL — ABNORMAL LOW (ref 3.5–5.2)
Alkaline Phosphatase: 59 U/L (ref 39–117)
BUN: 7 mg/dL (ref 6–23)
Calcium: 7.4 mg/dL — ABNORMAL LOW (ref 8.4–10.5)
GFR calc Af Amer: >90 mL/min (ref >90)
Potassium: 3.5 mEq/L (ref 3.5–5.1)
Sodium: 134 mEq/L — ABNORMAL LOW (ref 135–145)
Total Protein: 5 g/dL — ABNORMAL LOW (ref 6.0–8.3)

## 2012-06-24 LAB — CBC
HCT: 31.4 % — ABNORMAL LOW (ref 39.0–52.0)
Hemoglobin: 11.3 g/dL — ABNORMAL LOW (ref 13.0–17.0)
MCV: 83.7 fL (ref 78.0–100.0)
RDW: 13.7 % (ref 11.5–15.5)
WBC: 3.2 10*3/uL — ABNORMAL LOW (ref 4.0–10.5)

## 2012-06-24 LAB — CREATININE, URINE, RANDOM: Creatinine, Urine: 258 mg/dL

## 2012-06-24 LAB — CLOSTRIDIUM DIFFICILE BY PCR: Toxigenic C. Difficile by PCR: NEGATIVE

## 2012-06-24 MED ORDER — POTASSIUM CHLORIDE IN NACL 20-0.9 MEQ/L-% IV SOLN: INTRAVENOUS | Status: DC

## 2012-06-24 MED ORDER — MAGIC MOUTHWASH
5.0000 mL | Freq: Three times a day (TID) | ORAL | Status: DC
  Administered 2012-06-24 – 2012-06-25 (×3): 5 mL via ORAL
  Filled 2012-06-24 (×7): qty 5

## 2012-06-24 MED ORDER — POTASSIUM CHLORIDE IN NACL 40-0.9 MEQ/L-% IV SOLN
INTRAVENOUS | Status: DC
  Administered 2012-06-24: 75 mL/h via INTRAVENOUS
  Administered 2012-06-25: 04:00:00 via INTRAVENOUS
  Filled 2012-06-24 (×3): qty 1000

## 2012-06-24 NOTE — Telephone Encounter (Signed)
Received call from patient.  He was admitted to inpatient status, room 1514.  States his diarrhea is continuing and he continues to feel poorly.  His attending MD informed him he will likely need to stay a few more days.  Pt is scheduled for f/u on Friday 12/20.  Pt inquired if Dr. Dalene Carrow could see him while inpatient.  RN informed pt that Dr. Dalene Carrow out of office today, however, she will return tomorrow and will review information at that time.

## 2012-06-24 NOTE — Telephone Encounter (Signed)
Pt called and wants to move 12/20 lb/LO to 12/19. Message forwarded to desk nurse.

## 2012-06-24 NOTE — Progress Notes (Signed)
[facility] PROGRESS NOTE  [name] ZOX:[id number] DOB: 21-Mar-1966 DOA: 06/23/2012 PCP: Delorse Lek, MD  Brief narrative: [name] is a 46 year old man with a PMH of recurrent rectal cancer status post proctectomy with coloanal anastomosis and ileostomy 09/05/10, status post chemotherapy and XRT with rcurrent tumor in aortocaval lymph nodes 03/2012, under the care of Dr. Dalene Carrow, who was admitted 06/23/12 with diarrhea and dizziness.  Assessment/Plan: Principal Problem:  *Acute diarrhea  C. Diff negative.  Continue Immodium.  Likely viral gastroenteritis. Active Problems:  Rectal cancer  Dr. Dalene Carrow out of town.    Orthostatic hypotension  Continue IVF.  Stomatitis  Start magic mouthwash.  Hypokalemia  Supplemented.  Add KCL to IVF.  Pancytopenia  Likely the sequelae of prior chemotherapy.  Monitor.  Code Status: Full. Family Communication: None at bedside. Disposition Plan: Home when stable.   Medical Consultants:  None.  Other Consultants:  None.  Anti-infectives:  None.  HPI/Subjective: [name] says he has had over 40 loose stools in the past 24 hours.  He has had some episodes of N/V after taking the potassium pills.  He reports that his family has been sick with similar symptoms, but that they had resolution of symptoms much more quickly.   Objective: Filed Vitals:   06/23/12 2115 06/23/12 2116 06/23/12 2117 06/24/12 0544  BP: 136/80 136/64 109/71 120/69  Pulse: 96 106 107 85  Temp: 98.9 F (37.2 C)   98.6 F (37 C)  TempSrc: Oral   Oral  Resp: 18   18  Height: 5\' 11"  (1.803 m)     Weight: 91.9 kg (202 lb 9.6 oz)     SpO2: 97%   97%   No intake or output data in the 24 hours ending 06/24/12 1339  Exam: Gen:  NAD Cardiovascular:  RRR, No M/R/G Respiratory:  Lungs CTAB Gastrointestinal:  Abdomen soft, NT/ND, + BS Extremities:  No C/E/C  Data Reviewed: Basic Metabolic Panel:  Lab 06/24/12 8119 06/23/12 1640  NA 134* 135   K 3.5 3.2*  CL 107 103  CO2 23 22  GLUCOSE 111* 113*  BUN 7 14  CREATININE 0.72 0.82  CALCIUM 7.4* 8.4  MG -- --  PHOS -- --   GFR Estimated Creatinine Clearance: 130.8 ml/min (by C-G formula based on Cr of 0.72). Liver Function Tests:  Lab 06/24/12 0640 06/23/12 1640  AST 13 16  ALT 19 24  ALKPHOS 59 93  BILITOT 0.5 1.0  PROT 5.0* 6.4  ALBUMIN 2.7* 3.6    Lab 06/23/12 1640  LIPASE 34  AMYLASE --    CBC:  Lab 06/24/12 0640 06/23/12 1640  WBC 3.2* 4.8  NEUTROABS -- 3.6  HGB 11.3* 13.6  HCT 31.4* 37.4*  MCV 83.7 82.4  PLT 57* 87*   Microbiology Recent Results (from the past 240 hour(s))  CLOSTRIDIUM DIFFICILE BY PCR     Status: Normal   Collection Time   06/24/12 12:27 AM      Component Value Range Status Comment   C difficile by pcr NEGATIVE  NEGATIVE Final      Procedures and Diagnostic Studies: Nm Pet Image Restag (ps) Skull Base To Thigh  05/29/2012  *RADIOLOGY REPORT*  Clinical Data: Subsequent treatment strategy for metastatic rectal carcinoma.  NUCLEAR MEDICINE PET SKULL BASE TO THIGH  Fasting Blood Glucose:  92  Technique:  16.3 mCi F-18 FDG was injected intravenously. CT data was obtained and used for attenuation correction and anatomic localization only.  (  This was not acquired as a diagnostic CT examination.) Additional exam technical data entered on technologist worksheet.  Comparison:  02/13/2012  Findings:  Neck: No hypermetabolic lymph nodes in the neck.  Chest:  No hypermetabolic mediastinal or hilar nodes.  A sub centimeter nodule in the inferior right middle lobe is stable compared with previous exams on 02/13/2012 and 02/07/2012, and shows no hypermetabolic activity.  Abdomen/Pelvis:  No abnormal hypermetabolic activity within the liver, pancreas, adrenal glands, or spleen. Previously seen shotty hypermetabolic retroperitoneal lymph nodes in the aortocaval space show decrease in both size and hypermetabolic activity since prior exam.  No other  hypermetabolic adenopathy identified.  Focal hypermetabolic activity at the anorectal junction is again noted with maximum SUV of 5.4, which is not significantly change since previous study.  Skeleton:  No focal hypermetabolic activity to suggest skeletal metastasis.  IMPRESSION:  1.  Decreased size and hypermetabolic activity of shotty retroperitoneal lymph nodes in the aortocaval space. 2.  Stable hypermetabolic activity at the anorectal junction. 3.  No evidence of new or progressive disease.   Original Report Authenticated By: Myles Rosenthal, M.D.     Scheduled Meds:    . atorvastatin  10 mg Oral QHS  . dicyclomine  10 mg Oral TID  . heparin  5,000 Units Subcutaneous Q8H  . potassium chloride  40 mEq Oral BID   Continuous Infusions:    . sodium chloride 125 mL/hr at 06/23/12 2308    Time spent: 30 minutes.   LOS: 1 day   RAMA,CHRISTINA  [facility] Pager [phone].  If 8PM-8AM, please contact night-coverage at [email or web address], password [facility] 06/24/2012, 1:39 PM

## 2012-06-25 ENCOUNTER — Other Ambulatory Visit: Payer: Self-pay | Admitting: Hematology and Oncology

## 2012-06-25 DIAGNOSIS — D696 Thrombocytopenia, unspecified: Secondary | ICD-10-CM

## 2012-06-25 LAB — BASIC METABOLIC PANEL
CO2: 26 mEq/L (ref 19–32)
Chloride: 106 mEq/L (ref 96–112)
Creatinine, Ser: 0.77 mg/dL (ref 0.50–1.35)
GFR calc Af Amer: >90 mL/min (ref >90)
Potassium: 3.4 mEq/L — ABNORMAL LOW (ref 3.5–5.1)
Sodium: 138 mEq/L (ref 135–145)

## 2012-06-25 LAB — CBC
HCT: 33.1 % — ABNORMAL LOW (ref 39.0–52.0)
Hemoglobin: 11.7 g/dL — ABNORMAL LOW (ref 13.0–17.0)
MCV: 84.9 fL (ref 78.0–100.0)
RBC: 3.9 MIL/uL — ABNORMAL LOW (ref 4.22–5.81)
RDW: 13.8 % (ref 11.5–15.5)
WBC: 2.1 10*3/uL — ABNORMAL LOW (ref 4.0–10.5)

## 2012-06-25 MED ORDER — HEPARIN SOD (PORK) LOCK FLUSH 100 UNIT/ML IV SOLN
500.0000 [IU] | INTRAVENOUS | Status: AC | PRN
  Administered 2012-06-25: 500 [IU]

## 2012-06-25 MED ORDER — POTASSIUM CHLORIDE 10 MEQ/50ML IV SOLN
10.0000 meq | INTRAVENOUS | Status: AC
  Administered 2012-06-25 (×4): 10 meq via INTRAVENOUS
  Filled 2012-06-25 (×4): qty 50

## 2012-06-25 NOTE — Discharge Summary (Signed)
Physician Discharge Summary  [name] AOZ:[id number] DOB: [birth year] DOA: 06/23/2012  PCP: Delorse Lek, MD  Admit date: 06/23/2012 Discharge date: 06/25/2012  Recommendations for Outpatient Follow-up:  1. F/U electrolytes, CBC in 1 week.  Discharge Diagnoses:  Principal Problem:  *Acute diarrhea likely from viral gastroenteritis in the setting of immune suppression from prior chemotherapy Active Problems:   Rectal cancer   Orthostatic hypotension   Stomatitis   Hypokalemia   Pancytopenia   Discharge Condition: Improved.  Diet recommendation: Low sodium, heart healthy.  History of present illness:  Mr. [name] is a 46 year old man with a PMH of recurrent rectal cancer status post proctectomy with coloanal anastomosis and ileostomy 09/05/10, status post chemotherapy and XRT with rcurrent tumor in aortocaval lymph nodes 03/2012, under the care of Dr. Dalene Carrow, who was admitted 06/23/12 with diarrhea and dizziness.  Of note, multiple family members had GI illness prior to the patient's coming down with his symptoms.  Hospital Course by problem:  Principal Problem:  *Acute diarrhea likely viral gastroenteritis in the setting of immune suppression from prior chemotherapy C. Diff negative. Continue Immodium as needed.  Push PO fluids until diarrhea completely resolved.   Likely viral gastroenteritis given known sick contacts. Active Problems:  Rectal cancer  Dr. Dalene Carrow saw patient 06/25/12. F/U with her post-discharge. Orthostatic hypotension  Treated with IVF. Stomatitis  Given magic mouthwash. Hypokalemia  Supplemented.  Pancytopenia  Likely the sequelae of prior chemotherapy.   Procedures:  None.  Consultations:  Dr. Arlan Organ, Oncology  Discharge Exam: Filed Vitals:   06/25/12 0600  BP: 105/64  Pulse: 73  Temp: 98.5 F (36.9 C)  Resp: 18   Filed Vitals:   06/24/12 0544 06/24/12 1418 06/24/12 2302 06/25/12 0600  BP: 120/69 111/69 132/67  105/64  Pulse: 85 77 88 73  Temp: 98.6 F (37 C) 98.6 F (37 C) 98.2 F (36.8 C) 98.5 F (36.9 C)  TempSrc: Oral Oral Oral Oral  Resp: 18 18 18 18   Height:      Weight:      SpO2: 97% 99% 96% 97%    Gen:  NAD Cardiovascular:  RRR, No M/R/G Respiratory: Lungs CTAB Gastrointestinal: Abdomen soft, NT/ND with normal active bowel sounds. Extremities: No C/E/C   Discharge Instructions  Discharge Orders    Future Appointments: Provider: Department: Dept Phone: Center:   06/26/2012 11:00 AM Dava Najjar [facility] MEDICAL ONCOLOGY [phone] None   06/26/2012 11:30 AM Laurice Record, MD [facility] MEDICAL ONCOLOGY [phone] None   07/02/2012 10:30 AM [facility] MEDICAL ONCOL[facility]Y [phone] None   07/04/2012 11:00 AM [facility] Inj Nurse [facility] MEDICAL ONCOLOGY [phone] None   07/15/2012 9:00 AM [facility] MED[facility] ONCOLOG[facility] [phone] None   07/17/2012 3:00 PM [facility] Flush Nurse [facility] MEDICAL ONCOLOGY [phone] None   08/20/2012 9:15 AM [name], MD [facility] RADIATION ONCOLOGY [phone] None   09/23/2012 2:00 PM [facility] Flush Nurse [facility] MEDICAL ONCOLOGY [phone] None   11/18/2012 2:00 PM [facility] Flush Nurse [facility] MEDICAL ONCOLOGY [phone] None   01/13/2013 2:00 PM [facility] Flush Nurse [facility] MEDICAL ONCOLOGY [phone] None     Future Orders Please Complete By Expires   Diet - low sodium heart healthy      Scheduling Instructions:   Drink plenty of fluids (drinks with electrolytes, broth) until  diarrhea resolved.   Increase activity slowly      Call MD for:  temperature >100.4      Call MD for:  persistant nausea and vomiting      Call MD for:      Scheduling Instructions:   Persistent diarrhea.       Medication List      As of 06/25/2012 12:28 PM    TAKE these medications         dexamethasone 4 MG tablet   Commonly known as: DECADRON   Take 4 mg by mouth 2 (two) times daily with a meal. Takes the day following chemo for three days      dicyclomine 10 MG capsule   Commonly known as: BENTYL   Take 10 mg by mouth 3 (three) times daily.      GLUCOSAMINE HCL PO   Take 1 tablet by mouth daily.      loperamide 2 MG capsule   Commonly known as: IMODIUM   Take 2 mg by mouth 4 (four) times daily as needed. For diarrhea      ondansetron 8 MG tablet   Commonly known as: ZOFRAN   Take 8 mg by mouth every 12 (twelve) hours as needed. Take 8 mg po BID  X 3  Days after chemo , and as needed for nausea.      oxyCODONE 5 MG immediate release tablet   Commonly known as: Oxy IR/ROXICODONE   Take 5-15 mg by mouth every 4 (four) hours as needed. For pain      prochlorperazine 10 MG tablet   Commonly known as: COMPAZINE   Take 10 mg by mouth every 6 (six) hours as needed. Nausea      rosuvastatin 5 MG tablet   Commonly known as: CRESTOR   Take 5 mg by mouth at bedtime.      VITAMIN B-6 PO   Take 1 tablet by mouth daily.          The results of significant diagnostics from this hospitalization (including imaging, microbiology, ancillary and laboratory) are listed below for reference.    Significant Diagnostic Studies: Nm Pet Image Restag (ps) Skull Base To Thigh  05/29/2012  *RADIOLOGY REPORT*  Clinical Data: Subsequent treatment strategy for metastatic rectal carcinoma.  NUCLEAR MEDICINE PET SKULL BASE TO THIGH  Fasting Blood Glucose:  92  Technique:  16.3 mCi F-18 FDG was injected intravenously. CT data was obtained and used for attenuation correction and anatomic localization only.  (This was not acquired as a diagnostic CT examination.) Additional exam technical data entered on technologist worksheet.  Comparison:  02/13/2012  Findings:  Neck: No hypermetabolic lymph nodes in the neck.  Chest:  No  hypermetabolic mediastinal or hilar nodes.  A sub centimeter nodule in the inferior right middle lobe is stable compared with previous exams on 02/13/2012 and 02/07/2012, and shows no hypermetabolic activity.  Abdomen/Pelvis:  No abnormal hypermetabolic activity within the liver, pancreas, adrenal glands, or spleen. Previously seen shotty hypermetabolic retroperitoneal lymph nodes in the aortocaval space show decrease in both size and hypermetabolic activity since prior exam.  No other hypermetabolic adenopathy identified.  Focal hypermetabolic activity at the anorectal junction is again noted with maximum SUV of 5.4, which is not significantly change since previous study.  Skeleton:  No focal hypermetabolic activity to suggest skeletal metastasis.  IMPRESSION:  1.  Decreased size and hypermetabolic activity of shotty retroperitoneal lymph nodes in the aortocaval space. 2.  Stable hypermetabolic activity  at the anorectal junction. 3.  No evidence of new or progressive disease.   Original Report Authenticated By: Myles Rosenthal, M.D.     Microbiology: Recent Results (from the past 240 hour(s))  CLOSTRIDIUM DIFFICILE BY PCR     Status: Normal   Collection Time   06/24/12 12:27 AM      Component Value Range Status Comment   C difficile by pcr NEGATIVE  NEGATIVE Final      Labs: Basic Metabolic Panel:  Lab 06/25/12 1610 06/24/12 0640 06/23/12 1640  NA 138 134* 135  K 3.4* 3.5 3.2*  CL 106 107 103  CO2 26 23 22   GLUCOSE 93 111* 113*  BUN 4* 7 14  CREATININE 0.77 0.72 0.82  CALCIUM 8.1* 7.4* 8.4  MG -- -- --  PHOS -- -- --   Liver Function Tests:  Lab 06/24/12 0640 06/23/12 1640  AST 13 16  ALT 19 24  ALKPHOS 59 93  BILITOT 0.5 1.0  PROT 5.0* 6.4  ALBUMIN 2.7* 3.6    Lab 06/23/12 1640  LIPASE 34  AMYLASE --   No results found for this basename: AMMONIA:5 in the last 168 hours CBC:  Lab 06/25/12 0530 06/24/12 0640 06/23/12 1640  WBC 2.1* 3.2* 4.8  NEUTROABS -- -- 3.6  HGB 11.7*  11.3* 13.6  HCT 33.1* 31.4* 37.4*  MCV 84.9 83.7 82.4  PLT 62* 57* 87*    Time coordinating discharge: 25 minutes.  Signed:  RAMA,CHRISTINA  Pager [phone] [facility] 06/25/2012, 12:28 PM

## 2012-06-25 NOTE — Progress Notes (Signed)
SUBJECTIVE:  Pt admitted with profound fatigue with diarrhea and hypotension.  C.diff negative. IV hydration, electrolyte replacement and immodium have helped.   Reports one loose stool today.  He feels well. He denies fever or chills.    MEDICATIONS:  Scheduled:   . atorvastatin  10 mg Oral QHS  . dicyclomine  10 mg Oral TID  . heparin  5,000 Units Subcutaneous Q8H  . magic mouthwash  5 mL Oral TID PC & HS  . potassium chloride  10 mEq Intravenous Q1 Hr x 4   Continuous:   . 0.9 % NaCl with KCl 40 mEq / L 75 mL/hr at 06/25/12 0415   ZOX:[id number], acetaminophen, alum & mag hydroxide-simeth, HYDROmorphone (DILAUDID) injection, loperamide, ondansetron (ZOFRAN) IV, ondansetron, oxyCODONE, prochlorperazine   PHYSICAL EXAM  Vital signs in last 24 hours: Temp:  [98.2 F (36.8 C)-98.6 F (37 C)] 98.5 F (36.9 C) (12/19 0600) Pulse Rate:  [73-88] 73  (12/19 0600) Resp:  [18] 18  (12/19 0600) BP: (105-132)/(64-69) 105/64 mmHg (12/19 0600) SpO2:  [96 %-99 %] 97 % (12/19 0600)  Intake/Output from previous day: 12/18 0701 - 12/19 0700 In: 2104.5 [I.V.:2102.5; IV Piggyback:2] Out: -  Intake/Output this shift: Total I/O In: 240 [P.O.:240] Out: -   General appearance: alert and cooperative Resp: clear to auscultation bilaterally and normal percussion bilaterally Cardio: regular rate and rhythm, S1, S2 normal, no murmur, click, rub or gallop GI: soft, non-tender; bowel sounds normal; no masses,  no organomegaly Extremities: extremities normal, atraumatic, no cyanosis or edema Port - accessed without erythema.   LABS:  CBC    Component Value Date/Time   WBC 2.1* 06/25/2012 0530   WBC 3.8* 06/17/2012 0844   RBC 3.90* 06/25/2012 0530   RBC 4.69 06/17/2012 0844   HGB 11.7* 06/25/2012 0530   HGB 14.0 06/17/2012 0844   HCT 33.1* 06/25/2012 0530   HCT 40.5 06/17/2012 0844   PLT 62* 06/25/2012 0530   PLT 143 06/17/2012 0844   MCV 84.9 06/25/2012 0530   MCV 86.4  06/17/2012 0844   MCH 30.0 06/25/2012 0530   MCH 29.9 06/17/2012 0844   MCHC 35.3 06/25/2012 0530   MCHC 34.6 06/17/2012 0844   RDW 13.8 06/25/2012 0530   RDW 15.2* 06/17/2012 0844   LYMPHSABS 0.4* 06/23/2012 1640   LYMPHSABS 0.5* 06/17/2012 0844   MONOABS 0.7 06/23/2012 1640   MONOABS 0.4 06/17/2012 0844   EOSABS 0.1 06/23/2012 1640   EOSABS 0.1 06/17/2012 0844   BASOSABS 0.0 06/23/2012 1640   BASOSABS 0.0 06/17/2012 0844     Basename 06/25/12 0530 06/24/12 0640  NA 138 134*  K 3.4* 3.5  CL 106 107  CO2 26 23  GLUCOSE 93 111*  BUN 4* 7  CREATININE 0.77 0.72  CALCIUM 8.1* 7.4*     XRAYS/RESULTS: No results found.   ASSESSMENT and PLAN: 46 year old man with recurrent KRAS wild type rectal.  Began cycle 1 of FOLFIRI with Erbitux on 07/02/12. Treatment complicated by diarrhea which is resolving. Thrombocytopenia   With no evidence of bleeding.  Monitor labs.   Arlan Organ I., MD 06/25/2012

## 2012-06-26 ENCOUNTER — Ambulatory Visit (HOSPITAL_BASED_OUTPATIENT_CLINIC_OR_DEPARTMENT_OTHER): Payer: BC Managed Care – PPO

## 2012-06-26 ENCOUNTER — Ambulatory Visit (HOSPITAL_BASED_OUTPATIENT_CLINIC_OR_DEPARTMENT_OTHER): Payer: BC Managed Care – PPO | Admitting: Hematology and Oncology

## 2012-06-26 ENCOUNTER — Encounter: Payer: Self-pay | Admitting: Hematology and Oncology

## 2012-06-26 ENCOUNTER — Other Ambulatory Visit (HOSPITAL_BASED_OUTPATIENT_CLINIC_OR_DEPARTMENT_OTHER): Payer: BC Managed Care – PPO | Admitting: Lab

## 2012-06-26 VITALS — BP 134/78 | HR 76 | Temp 98.4°F | Resp 20 | Ht 71.0 in | Wt 199.6 lb

## 2012-06-26 DIAGNOSIS — C20 Malignant neoplasm of rectum: Secondary | ICD-10-CM

## 2012-06-26 DIAGNOSIS — R197 Diarrhea, unspecified: Secondary | ICD-10-CM

## 2012-06-26 DIAGNOSIS — C19 Malignant neoplasm of rectosigmoid junction: Secondary | ICD-10-CM

## 2012-06-26 DIAGNOSIS — R21 Rash and other nonspecific skin eruption: Secondary | ICD-10-CM

## 2012-06-26 LAB — CBC WITH DIFFERENTIAL/PLATELET
Basophils Absolute: 0 10*3/uL (ref 0.0–0.1)
EOS%: 2 % (ref 0.0–7.0)
Eosinophils Absolute: 0.1 10*3/uL (ref 0.0–0.5)
HCT: 36.7 % — ABNORMAL LOW (ref 38.4–49.9)
HGB: 12.9 g/dL — ABNORMAL LOW (ref 13.0–17.1)
MCH: 29.9 pg (ref 27.2–33.4)
MCV: 85 fL (ref 79.3–98.0)
MONO%: 16.8 % — ABNORMAL HIGH (ref 0.0–14.0)
NEUT#: 2.3 10*3/uL (ref 1.5–6.5)
NEUT%: 67.5 % (ref 39.0–75.0)
RDW: 14.1 % (ref 11.0–14.6)

## 2012-06-26 MED ORDER — POTASSIUM CHLORIDE CRYS ER 20 MEQ PO TBCR
40.0000 meq | EXTENDED_RELEASE_TABLET | Freq: Once | ORAL | Status: AC
  Administered 2012-06-26: 40 meq via ORAL
  Filled 2012-06-26: qty 2

## 2012-06-26 MED ORDER — SODIUM CHLORIDE 0.9 % IV SOLN
INTRAVENOUS | Status: AC
  Administered 2012-06-26: 14:00:00 via INTRAVENOUS

## 2012-06-26 MED ORDER — CLINDAMYCIN PHOSPHATE 1 % EX GEL: Freq: Two times a day (BID) | CUTANEOUS | Status: DC

## 2012-06-26 MED ORDER — LOPERAMIDE HCL 2 MG PO TABS
2.0000 mg | ORAL_TABLET | Freq: Once | ORAL | Status: AC
  Administered 2012-06-26: 2 mg via ORAL
  Filled 2012-06-26: qty 1

## 2012-06-26 NOTE — Patient Instructions (Signed)
Dehydration, Adult Dehydration means your body does not have as much fluid as it needs. Your kidneys, brain, and heart will not work properly without the right amount of fluids and salt.  HOME CARE  Ask your doctor how to replace body fluid losses (rehydrate).  Drink enough fluids to keep your pee (urine) clear or pale yellow.  Drink small amounts of fluids often if you feel sick to your stomach (nauseous) or throw up (vomit).  Eat like you normally do.  Avoid:  Foods or drinks high in sugar.  Bubbly (carbonated) drinks.  Juice.  Very hot or cold fluids.  Drinks with caffeine.  Fatty, greasy foods.  Alcohol.  Tobacco.  Eating too much.  Gelatin desserts.  Wash your hands to avoid spreading germs (bacteria, viruses).  Only take medicine as told by your doctor.  Keep all doctor visits as told. GET HELP RIGHT AWAY IF:   You cannot drink something without throwing up.  You get worse even with treatment.  Your vomit has blood in it or looks greenish.  Your poop (stool) has blood in it or looks black and tarry.  You have not peed in 6 to 8 hours.  You pee a small amount of very dark pee.  You have a fever.  You pass out (faint).  You have belly (abdominal) pain that gets worse or stays in one spot (localizes).  You have a rash, stiff neck, or bad headache.  You get easily annoyed, sleepy, or are hard to wake up.  You feel weak, dizzy, or very thirsty. MAKE SURE YOU:   Understand these instructions.  Will watch your condition.  Will get help right away if you are not doing well or get worse. Document Released: 04/20/2009 Document Revised: 09/16/2011 Document Reviewed: 02/11/2011 Exit[employer] Patient Information 2013 [employer].  

## 2012-06-26 NOTE — Patient Instructions (Addendum)
[name]  [id number]   [facility] - AFTER VISIT SUMMARY   **RECOMMENDATIONS MADE BY THE CONSULTANT AND ANY TEST    RESULTS WILL BE SENT TO YOUR REFERRING DOCTORS.   YOUR EXAM FINDINGS, LABS AND RESULTS WERE DISCUSSED BY YOUR MD TODAY.  YOU CAN GO TO THE [place] WEB SITE FOR INSTRUCTIONS ON HOW TO ASSESS MY CHART FOR ADDITIONAL INFORMATION AS NEEDED.  Your Updated drug allergies are: Allergies as of 06/26/2012 - Review Complete 06/23/2012  Allergen Reaction Noted  . Penicillins Itching 07/06/2011    Your current list of medications are: Current Outpatient Prescriptions  Medication Sig Dispense Refill  . dicyclomine (BENTYL) 10 MG capsule Take 10 mg by mouth 3 (three) times daily.       Marland Kitchen GLUCOSAMINE HCL PO Take 1 tablet by mouth daily.      Marland Kitchen loperamide (IMODIUM) 2 MG capsule Take 2 mg by mouth 4 (four) times daily as needed. For diarrhea      . ondansetron (ZOFRAN) 8 MG tablet Take 8 mg by mouth every 12 (twelve) hours as needed. Take 8 mg po BID  X 3  Days after chemo , and as needed for nausea.      . ondansetron (ZOFRAN-ODT) 8 MG disintegrating tablet       . oxyCODONE (OXY IR/ROXICODONE) 5 MG immediate release tablet Take 5-15 mg by mouth every 4 (four) hours as needed. For pain      . prochlorperazine (COMPAZINE) 10 MG tablet Take 10 mg by mouth every 6 (six) hours as needed. Nausea      . Pyridoxine HCl (VITAMIN B-6 PO) Take 1 tablet by mouth daily.      . rosuvastatin (CRESTOR) 5 MG tablet Take 5 mg by mouth at bedtime.      . clindamycin (CLINDAGEL) 1 % gel Apply topically 2 (two) times daily. Apply to areas with rash.  60 g  1     INSTRUCTIONS GIVEN AND DISCUSSED:  See attached schedule   SPECIAL INSTRUCTIONS/FOLLOW-UP:  See above.  I acknowledge that I have been informed and understand all the instructions given to me and received a copy.I know to contact the clinic, my physician, or go to the emergency Department if any problems should occur.    I do not have any more questions at this time, but understand that I may call the [facility] at [phone] during business hours should I have any further questions or need assistance in obtaining follow-up care.

## 2012-06-26 NOTE — Progress Notes (Signed)
This office note has been dictated.

## 2012-06-27 ENCOUNTER — Telehealth: Payer: Self-pay | Admitting: *Deleted

## 2012-06-27 ENCOUNTER — Ambulatory Visit: Payer: BC Managed Care – PPO

## 2012-06-27 NOTE — Telephone Encounter (Signed)
Received call from pt this am stating that he was not going to make it for his infusion.  He reports that he is taking care of his granddaughter & he is eating & doesn't think he needs the fluids.

## 2012-06-27 NOTE — Telephone Encounter (Signed)
Per last office visit patient to have chemo on 1/2. Per Trey Paula ok to double book a chair. Melissa to call paitent.   JMW

## 2012-06-27 NOTE — Progress Notes (Signed)
CC:   [name], M.D. [name], M.D. [name], M.D.  IDENTIFYING STATEMENT:  The patient is a 46 year old man with recurrent rectal cancer who presents for followup.  INTERVAL HISTORY:  [name] received his 1st cycle of FOLFIRI with Erbitux for recurrent rectal cancer on June 17, 2012,  He was, unfortunately, hospitalized for grade 2-3 diarrhea.  He was managed with IV fluids and electrolytes replenished.  He was discharged yesterday. He notes episode of diarrhea early this morning.  He did not take Imodium.  He is afebrile.  He has had no nausea, vomiting, mucositis, or hand-foot syndrome.  His is beginning to have a rash on his face.  It is not bothering him that much.  MEDICATIONS:  Reviewed.  ALLERGIES:  Penicillin.  PHYSICAL EXAMINATION:  General:  Alert and oriented x3.  Vitals:  Pulse 76, blood pressure 134/78, temperature 98.4, respirations 20, weight 199 pounds.  HEENT:  Head is atraumatic, normocephalic.  Sclerae anicteric. Mouth moist.  Chest:  Clear.  Abdomen:  Soft.  Bowel sounds present. Extremities:  No edema.  Skin:  Notes edematous pinpoint lesions on his nose and forehead.  Nontender.  LABORATORY DATA:  White cell count 3.5, hemoglobin 12.9, hematocrit 36.7, platelets 86.  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  He went on to receive 6 cycles of carboplatin-based therapy with 2 cycles FOLFOX and 4 cycles of XELOX.  He then completed external radiation therapy with continuous infusion 5-FU from April 16, 2011,  through May 21, 2011.  He then underwent surveillance and was found to have recurrent tumor located to an aortocaval lymph node with an elevated CEA of 53 in September 2013. He began localized radiation therapy with CyberKnife concomitantly with Xeloda 1000 mg p.o. b.i.d. on 03/23/2012, completing therapy on 04/21/2012.  His CEA level decreased.  PET scan did show activity at the aortocaval node chain with persistent activity at the rectal anastomosis.  He was then initiated on FOLFIRI with Erbitux for which he has received 1 cycle and now complicated by diarrhea.  [name] has agreed to hold chemotherapy for at least 2 weeks and then will reevaluate.  He was counseled about the proper use of Imodium.  He will try and keep himself well hydrated. He will receive IV fluids today and tomorrow in the clinic.  He was prescribed Cleocin ointment to use twice daily for EGFR-induced rash.   ______________________________ Laurice Record, M.D. LIO/MEDQ  D:  06/26/2012  T:  06/27/2012  Job:  [id number]

## 2012-07-02 ENCOUNTER — Inpatient Hospital Stay: Payer: BC Managed Care – PPO

## 2012-07-03 ENCOUNTER — Telehealth: Payer: Self-pay | Admitting: Oncology

## 2012-07-03 NOTE — Telephone Encounter (Signed)
S/w pt today re reassignment and next appt for 12/31. Pt informed that this is lb/fu (KC/FS) to get him re-establishing prior to his next cycle of chemo. Pt did ask if there was anyway he could stay w/LO or go to where she was going. Pt was informed that we do not know where LO will be practicing the only information we have is that as of January she will no longer be with [place]. Pt states ok.

## 2012-07-07 ENCOUNTER — Encounter: Payer: Self-pay | Admitting: Oncology

## 2012-07-07 ENCOUNTER — Other Ambulatory Visit (HOSPITAL_BASED_OUTPATIENT_CLINIC_OR_DEPARTMENT_OTHER): Payer: BC Managed Care – PPO | Admitting: Lab

## 2012-07-07 ENCOUNTER — Ambulatory Visit (HOSPITAL_BASED_OUTPATIENT_CLINIC_OR_DEPARTMENT_OTHER): Payer: BC Managed Care – PPO | Admitting: Oncology

## 2012-07-07 ENCOUNTER — Telehealth: Payer: Self-pay | Admitting: Oncology

## 2012-07-07 VITALS — BP 133/76 | HR 18 | Temp 97.7°F | Resp 98 | Ht 71.0 in | Wt 204.6 lb

## 2012-07-07 DIAGNOSIS — C20 Malignant neoplasm of rectum: Secondary | ICD-10-CM

## 2012-07-07 DIAGNOSIS — R21 Rash and other nonspecific skin eruption: Secondary | ICD-10-CM

## 2012-07-07 LAB — CBC WITH DIFFERENTIAL/PLATELET
Eosinophils Absolute: 0.1 10*3/uL (ref 0.0–0.5)
HCT: 38.2 % — ABNORMAL LOW (ref 38.4–49.9)
LYMPH%: 13.3 % — ABNORMAL LOW (ref 14.0–49.0)
MCHC: 34 g/dL (ref 32.0–36.0)
MCV: 87.4 fL (ref 79.3–98.0)
MONO#: 0.5 10*3/uL (ref 0.1–0.9)
MONO%: 11.7 % (ref 0.0–14.0)
NEUT#: 3.3 10*3/uL (ref 1.5–6.5)
NEUT%: 71.1 % (ref 39.0–75.0)
Platelets: 179 10*3/uL (ref 140–400)
WBC: 4.6 10*3/uL (ref 4.0–10.3)

## 2012-07-07 MED ORDER — DOXYCYCLINE HYCLATE 100 MG PO TABS: 100.0000 mg | ORAL_TABLET | Freq: Two times a day (BID) | ORAL | Status: DC

## 2012-07-07 NOTE — Patient Instructions (Addendum)
Skin Rash:  Continue Cleocin cream to face twice a day. Use moisturizer (Eucerin, Lubriderm, etc) to face hands, feet, neck, back, and chest daily in AM. Use sunscreen(SPF >15) before going outdoors. Use Hydrocortisone 1% applied to the same regions as morning moisturizer at bedtime. Begin Doxycycline 100 mg twice a day (take with food).

## 2012-07-07 NOTE — Progress Notes (Signed)
Hematology and Oncology Follow Up Visit  [name] [id number] [birth year] 46 y.o. 07/07/2012 8:33 PM [name],[name], MDBurnett, [name], MD   Principle Diagnosis: The patient is a 46 year old man with recurrent KRAS wild-type  rectal cancer  Prior Therapy:  1. 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  2. He went on to receive 6 cycles of oxaliplatin-based therapy with 2 cycles FOLFOX and 4 cycles of XELOX.  3. He then completed external radiation therapy with continuous infusion 5-FU from April 16, 2011, through May 21, 2011.  4. He then underwent surveillance and was found to have recurrent tumor located to an aortocaval lymph node with an elevated CEA of 53 in September 2013. He began localized radiation therapy with CyberKnife concomitantly with Xeloda 1000 mg p.o. b.i.d. on 03/23/2012, completing therapy on 04/21/2012. His CEA level decreased. PET scan did show activity at the aortocaval node chain with persistent activity at the rectal anastomosis.   Current therapy: He was started on FOLFIRI with Erbitux on 06/17/12. He received 1 cycle which was complicated by diarrhea. Chemo has been on hold.  Interim History:  [name] presents to the office today for routine follow-up with his wife. Chemotherapy has been placed on hold due to diarrhea. Diarrhea has now resolved. He developed a rash due to Erbitux that has significantly improved at this time. He used Clindamycin gel to his face with improvement. Not routinely using Hydrocortisone or moisturizer at this time. Appetite has improved and he is gaining back the weight that he lost. Denies chest pain, shortness of breath, abdominal pain, nausea, vomiting. He has not noticed any bleeding.  Medications: I have  reviewed the patient's current medications. Current outpatient prescriptions:clindamycin (CLINDAGEL) 1 % gel, Apply topically 2 (two) times daily. Apply to areas with rash., Disp: 60 g, Rfl: 1;  dicyclomine (BENTYL) 10 MG capsule, Take 10 mg by mouth 3 (three) times daily. , Disp: , Rfl: ;  doxycycline (VIBRA-TABS) 100 MG tablet, Take 1 tablet (100 mg total) by mouth 2 (two) times daily., Disp: 60 tablet, Rfl: 2;  GLUCOSAMINE HCL PO, Take 1 tablet by mouth daily., Disp: , Rfl:  loperamide (IMODIUM) 2 MG capsule, Take 2 mg by mouth 4 (four) times daily as needed. For diarrhea, Disp: , Rfl: ;  ondansetron (ZOFRAN) 8 MG tablet, Take 8 mg by mouth every 12 (twelve) hours as needed. Take 8 mg po BID  X 3  Days after chemo , and as needed for nausea., Disp: , Rfl: ;  ondansetron (ZOFRAN-ODT) 8 MG disintegrating tablet, , Disp: , Rfl:  oxyCODONE (OXY IR/ROXICODONE) 5 MG immediate release tablet, Take 5-15 mg by mouth every 4 (four) hours as needed. For pain, Disp: , Rfl: ;  prochlorperazine (COMPAZINE) 10 MG tablet, Take 10 mg by mouth every 6 (six) hours as needed. Nausea, Disp: , Rfl: ;  Pyridoxine HCl (VITAMIN B-6 PO), Take 1 tablet by mouth daily., Disp: , Rfl: ;  rosuvastatin (CRESTOR) 5 MG tablet, Take 5 mg by mouth at bedtime., Disp: , Rfl:   Allergies:  Allergies  Allergen Reactions  . Penicillins Itching    Intermittent reaction per pt    Past Medical History, Surgical history, Social history, and Family History were reviewed and updated.  Review of Systems: Constitutional:  Negative for  fever, chills, night sweats, anorexia, weight loss, pain. Cardiovascular: no chest pain or dyspnea on exertion Respiratory: no cough, shortness of breath, or wheezing Neurological: no TIA or stroke symptoms Dermatological: negative ENT: negative Skin: Negative. Gastrointestinal: no abdominal pain, change in bowel habits, or black or bloody stools Genito-Urinary: no dysuria, trouble voiding, or  hematuria Hematological and Lymphatic: negative Breast: negative for breast lumps Musculoskeletal: negative Remaining ROS negative.  Physical Exam: Blood pressure 133/76, pulse 18, temperature 97.7 F (36.5 C), temperature source Oral, resp. rate 98, height 5\' 11"  (1.803 m), weight 204 lb 9.6 oz (92.806 kg). ECOG: 0 General appearance: alert, cooperative and no distress Head: Normocephalic, without obvious abnormality, atraumatic Neck: no adenopathy, no carotid bruit, no JVD, supple, symmetrical, trachea midline and thyroid not enlarged, symmetric, no tenderness/mass/nodules Lymph nodes: Cervical, supraclavicular, and axillary nodes normal. Heart:regular rate and rhythm, S1, S2 normal, no murmur, click, rub or gallop Lung:chest clear, no wheezing, rales, normal symmetric air entry Abdomen: soft, non-tender, without masses or organomegaly EXT:no erythema, induration, or nodules   Lab Results: Lab Results  Component Value Date   WBC 4.6 07/07/2012   HGB 13.0 07/07/2012   HCT 38.2* 07/07/2012   MCV 87.4 07/07/2012   PLT 179 07/07/2012     Chemistry      Component Value Date/Time   NA 138 06/25/2012 0530   NA 140 05/29/2012 0955   NA 142 02/07/2012 0734   K 3.4* 06/25/2012 0530   K 4.3 05/29/2012 0955   K 4.5 02/07/2012 0734   CL 106 06/25/2012 0530   CL 111* 05/29/2012 0955   CL 104 02/07/2012 0734   CO2 26 06/25/2012 0530   CO2 25 05/29/2012 0955   CO2 26 02/07/2012 0734   BUN 4* 06/25/2012 0530   BUN 16.0 05/29/2012 0955   BUN 13 02/07/2012 0734   CREATININE 0.77 06/25/2012 0530   CREATININE 0.8 05/29/2012 0955   CREATININE 1.0 02/07/2012 0734      Component Value Date/Time   CALCIUM 8.1* 06/25/2012 0530   CALCIUM 9.3 05/29/2012 0955   CALCIUM 9.3 02/07/2012 0734   ALKPHOS 59 06/24/2012 0640   ALKPHOS 66 05/29/2012 0955   ALKPHOS 53 02/07/2012 0734   AST 13 06/24/2012 0640   AST 21 05/29/2012 0955   AST 31 02/07/2012 0734   ALT 19 06/24/2012 0640   ALT 28 05/29/2012 0955    BILITOT 0.5 06/24/2012 0640   BILITOT 0.94 05/29/2012 0955   BILITOT 0.80 02/07/2012 0734      Impression and Plan: This is a 46 year old gentleman with the following issues:  1. Recurrent KRAS wild-type rectal cancer. Currently receiving systemic chemotherapy with FOLFIRI/Erbitux. He developed grade 3 diarrhea and grade 2-3 rash with cycle 1. Recommend that he proceed with cycle 2 of his chemotherapy on 07/09/12. Erbitux will remain at the same dose. Irinotecan, 5-FU, and Leucovorin will be dose reduced by 25% due to diarrhea. The patient is in agreement with this and is ready to proceed with chemo on 07/09/12.  2. Neutropenia prophylaxis. On Neulasta with each cycle of chemo.  3. Skin rash. Due to Erbitux. Continue Clindamycin gel to face. I have added Doxycycline 100 mg BID. I have given him instructions regarding the use of Hydrocortisone, moisturizer, and sunscreen.  4. Diarrhea. Due to chemotherapy. Resolved. Will reduced irinotecan, 5-FU, and Leucovorin by 25%. Recommend that he use Imodium as needed. May need Lomotil in the future if Imodium not effective.  5. Nausea/vomiting prophylaxis. He has  Zofran and Compazine at home.  6. Hyperlipidemia. On Crestor per PCP.  7. Follow-up. In 2 weeks prior to cycle 3.  Case reviewed with Dr Clelia Croft.   Spent more than half the time coordinating care.    Clenton Pare 12/31/20138:33 PM

## 2012-07-07 NOTE — Progress Notes (Signed)
FMLA and disability papers completed and given to the patient.

## 2012-07-07 NOTE — Telephone Encounter (Signed)
Gave patient appt for labs and MD for January 2014, emailed Marcelino Duster regarding chemo,

## 2012-07-09 ENCOUNTER — Other Ambulatory Visit: Payer: Self-pay | Admitting: Oncology

## 2012-07-09 ENCOUNTER — Telehealth: Payer: Self-pay | Admitting: *Deleted

## 2012-07-09 ENCOUNTER — Ambulatory Visit (HOSPITAL_BASED_OUTPATIENT_CLINIC_OR_DEPARTMENT_OTHER): Payer: BC Managed Care – PPO

## 2012-07-09 VITALS — BP 132/75 | HR 71 | Temp 98.7°F

## 2012-07-09 DIAGNOSIS — C20 Malignant neoplasm of rectum: Secondary | ICD-10-CM

## 2012-07-09 DIAGNOSIS — Z5112 Encounter for antineoplastic immunotherapy: Secondary | ICD-10-CM

## 2012-07-09 DIAGNOSIS — Z5111 Encounter for antineoplastic chemotherapy: Secondary | ICD-10-CM

## 2012-07-09 MED ORDER — ONDANSETRON 16 MG/50ML IVPB (CHCC)
16.0000 mg | Freq: Once | INTRAVENOUS | Status: AC
  Administered 2012-07-09: 16 mg via INTRAVENOUS

## 2012-07-09 MED ORDER — SODIUM CHLORIDE 0.9 % IV SOLN
1800.0000 mg/m2 | INTRAVENOUS | Status: DC
  Filled 2012-07-09: qty 79

## 2012-07-09 MED ORDER — ATROPINE SULFATE 1 MG/ML IJ SOLN
0.5000 mg | Freq: Once | INTRAMUSCULAR | Status: AC | PRN
  Administered 2012-07-09: 0.5 mg via INTRAVENOUS

## 2012-07-09 MED ORDER — CETUXIMAB CHEMO IV INJECTION 200 MG/100ML
500.0000 mg/m2 | Freq: Once | INTRAVENOUS | Status: AC
  Administered 2012-07-09: 1100 mg via INTRAVENOUS
  Filled 2012-07-09: qty 550

## 2012-07-09 MED ORDER — IRINOTECAN HCL CHEMO INJECTION 100 MG/5ML
137.0000 mg/m2 | Freq: Once | INTRAVENOUS | Status: AC
  Administered 2012-07-09: 300 mg via INTRAVENOUS
  Filled 2012-07-09: qty 15

## 2012-07-09 MED ORDER — ATROPINE SULFATE 1 MG/ML IJ SOLN
1.0000 mg | Freq: Once | INTRAMUSCULAR | Status: AC
  Administered 2012-07-09: 1 mg via INTRAVENOUS

## 2012-07-09 MED ORDER — DIPHENHYDRAMINE HCL 50 MG/ML IJ SOLN
50.0000 mg | Freq: Once | INTRAMUSCULAR | Status: AC
  Administered 2012-07-09: 50 mg via INTRAVENOUS

## 2012-07-09 MED ORDER — LEUCOVORIN CALCIUM INJECTION 350 MG
301.0000 mg/m2 | Freq: Once | INTRAVENOUS | Status: AC
  Administered 2012-07-09: 660 mg via INTRAVENOUS
  Filled 2012-07-09: qty 33

## 2012-07-09 MED ORDER — DEXAMETHASONE SODIUM PHOSPHATE 10 MG/ML IJ SOLN
20.0000 mg | Freq: Once | INTRAMUSCULAR | Status: AC
  Administered 2012-07-09: 20 mg via INTRAVENOUS

## 2012-07-09 MED ORDER — SODIUM CHLORIDE 0.9 % IV SOLN
3950.0000 mg | INTRAVENOUS | Status: DC
  Administered 2012-07-09: 3950 mg via INTRAVENOUS
  Filled 2012-07-09: qty 79

## 2012-07-09 MED ORDER — FLUOROURACIL CHEMO INJECTION 2.5 GM/50ML
300.0000 mg/m2 | Freq: Once | INTRAVENOUS | Status: AC
  Administered 2012-07-09: 650 mg via INTRAVENOUS
  Filled 2012-07-09: qty 13

## 2012-07-09 MED ORDER — ATROPINE SULFATE 1 MG/ML IJ SOLN
0.5000 mg | Freq: Once | INTRAMUSCULAR | Status: AC
  Administered 2012-07-09: 0.5 mg via INTRAVENOUS

## 2012-07-09 MED ORDER — SODIUM CHLORIDE 0.9 % IV SOLN
Freq: Once | INTRAVENOUS | Status: AC
  Administered 2012-07-09: 12:00:00 via INTRAVENOUS

## 2012-07-09 NOTE — Progress Notes (Signed)
1500-Pt c/o "terrible" crampy abdominal pain.  Irinotecan and Leucovorin paused-dhp, rn 1506- Pt states after pausing chemotherapy, his abdominal pain is some better.  Atropine 0.5mg  IV given.  Clenton Pare, PA aware of abdominal pain.  Will keep chemo paused until cramping subsides per Kristin-dhp, rn 1520- Pt reports his abdominal pain is down to 2-3 from 10+.  He is anxious about restarting chemotherapy.  Clenton Pare, PA aware.  Per VO Kristin, repeat Atropine 0.5mg , then restart chemotherapy-dhp, rn 1530- Pt reports his abdominal pain has completely resolved.  Will restart chemo-dhp, rn 1600-Pt complains of mild abdominal cramping.  Clenton Pare, PA aware.  Per VO Kristin, repeat Atropine 1mg  x 1-dhp, rn 1605- Pt states abdominal cramping is 10/10.  Atropine given-dhp, rn3 1625- Pt states pain resolved.  Chemotherapy restarted.  Okay per Clenton Pare, PA, to change 5FU pump to 44 hours-dhp, rn

## 2012-07-09 NOTE — Telephone Encounter (Signed)
Per staff message and POF I have scheduled appts.  JMW  

## 2012-07-09 NOTE — Patient Instructions (Signed)
[facility] Discharge Instructions for Patients Receiving Chemotherapy  Today you received the following chemotherapy agents Erbitux, Irinotecan, Leucovorin and 5FU  To help prevent nausea and vomiting after your treatment, we encourage you to take your nausea medication as prescribed.   If you develop nausea and vomiting that is not controlled by your nausea medication, call the clinic. If it is after clinic hours your family physician or the after hours number for the clinic or go to the Emergency Department.   BELOW ARE SYMPTOMS THAT SHOULD BE REPORTED IMMEDIATELY:  *FEVER GREATER THAN 100.5 F  *CHILLS WITH OR WITHOUT FEVER  NAUSEA AND VOMITING THAT IS NOT CONTROLLED WITH YOUR NAUSEA MEDICATION  *UNUSUAL SHORTNESS OF BREATH  *UNUSUAL BRUISING OR BLEEDING  TENDERNESS IN MOUTH AND THROAT WITH OR WITHOUT PRESENCE OF ULCERS  *URINARY PROBLEMS  *BOWEL PROBLEMS  UNUSUAL RASH Items with * indicate a potential emergency and should be followed up as soon as possible.  Feel free to call the clinic you have any questions or concerns. The clinic phone number is [phone].   I have been informed and understand all the instructions given to me. I know to contact the clinic, my physician, or go to the Emergency Department if any problems should occur. I do not have any questions at this time, but understand that I may call the clinic during office hours   should I have any questions or need assistance in obtaining follow up care.

## 2012-07-11 ENCOUNTER — Ambulatory Visit (HOSPITAL_BASED_OUTPATIENT_CLINIC_OR_DEPARTMENT_OTHER): Payer: BC Managed Care – PPO

## 2012-07-11 VITALS — BP 138/68 | HR 81 | Temp 98.1°F | Resp 18

## 2012-07-11 DIAGNOSIS — C20 Malignant neoplasm of rectum: Secondary | ICD-10-CM

## 2012-07-11 DIAGNOSIS — Z5189 Encounter for other specified aftercare: Secondary | ICD-10-CM

## 2012-07-11 MED ORDER — PEGFILGRASTIM INJECTION 6 MG/0.6ML
6.0000 mg | Freq: Once | SUBCUTANEOUS | Status: AC
  Administered 2012-07-11: 6 mg via SUBCUTANEOUS

## 2012-07-11 MED ORDER — SODIUM CHLORIDE 0.9 % IJ SOLN
10.0000 mL | INTRAMUSCULAR | Status: DC | PRN
  Administered 2012-07-11: 10 mL
  Filled 2012-07-11: qty 10

## 2012-07-11 MED ORDER — HEPARIN SOD (PORK) LOCK FLUSH 100 UNIT/ML IV SOLN
500.0000 [IU] | Freq: Once | INTRAVENOUS | Status: AC | PRN
  Administered 2012-07-11: 500 [IU]
  Filled 2012-07-11: qty 5

## 2012-07-14 ENCOUNTER — Other Ambulatory Visit: Payer: Self-pay | Admitting: Oncology

## 2012-07-15 ENCOUNTER — Inpatient Hospital Stay: Payer: BC Managed Care – PPO

## 2012-07-15 ENCOUNTER — Telehealth: Payer: Self-pay | Admitting: Oncology

## 2012-07-15 NOTE — Telephone Encounter (Signed)
Talked to patient he is aware of appt on 07/22/12 lab,MD  chemo

## 2012-07-22 ENCOUNTER — Ambulatory Visit (HOSPITAL_BASED_OUTPATIENT_CLINIC_OR_DEPARTMENT_OTHER): Payer: BC Managed Care – PPO | Admitting: Oncology

## 2012-07-22 ENCOUNTER — Telehealth: Payer: Self-pay | Admitting: *Deleted

## 2012-07-22 ENCOUNTER — Other Ambulatory Visit (HOSPITAL_BASED_OUTPATIENT_CLINIC_OR_DEPARTMENT_OTHER): Payer: BC Managed Care – PPO | Admitting: Lab

## 2012-07-22 ENCOUNTER — Telehealth: Payer: Self-pay | Admitting: Oncology

## 2012-07-22 ENCOUNTER — Ambulatory Visit (HOSPITAL_BASED_OUTPATIENT_CLINIC_OR_DEPARTMENT_OTHER): Payer: BC Managed Care – PPO

## 2012-07-22 VITALS — BP 121/73 | HR 77 | Temp 98.7°F | Resp 20 | Ht 71.0 in | Wt 203.3 lb

## 2012-07-22 DIAGNOSIS — C19 Malignant neoplasm of rectosigmoid junction: Secondary | ICD-10-CM

## 2012-07-22 DIAGNOSIS — C20 Malignant neoplasm of rectum: Secondary | ICD-10-CM

## 2012-07-22 DIAGNOSIS — Z5111 Encounter for antineoplastic chemotherapy: Secondary | ICD-10-CM

## 2012-07-22 DIAGNOSIS — R21 Rash and other nonspecific skin eruption: Secondary | ICD-10-CM

## 2012-07-22 DIAGNOSIS — Z5112 Encounter for antineoplastic immunotherapy: Secondary | ICD-10-CM

## 2012-07-22 LAB — CBC WITH DIFFERENTIAL/PLATELET
Basophils Absolute: 0.1 10*3/uL (ref 0.0–0.1)
Eosinophils Absolute: 0.2 10*3/uL (ref 0.0–0.5)
HCT: 41.4 % (ref 38.4–49.9)
HGB: 14.4 g/dL (ref 13.0–17.1)
LYMPH%: 6.9 % — ABNORMAL LOW (ref 14.0–49.0)
MONO#: 0.3 10*3/uL (ref 0.1–0.9)
NEUT#: 5.4 10*3/uL (ref 1.5–6.5)
Platelets: 109 10*3/uL — ABNORMAL LOW (ref 140–400)
RBC: 4.77 10*6/uL (ref 4.20–5.82)
WBC: 6.4 10*3/uL (ref 4.0–10.3)

## 2012-07-22 LAB — COMPREHENSIVE METABOLIC PANEL (CC13)
ALT: 33 U/L (ref 0–55)
CO2: 26 mEq/L (ref 22–29)
Calcium: 9.2 mg/dL (ref 8.4–10.4)
Chloride: 104 mEq/L (ref 98–107)
Creatinine: 0.8 mg/dL (ref 0.7–1.3)
Glucose: 110 mg/dl — ABNORMAL HIGH (ref 70–99)
Total Protein: 6.9 g/dL (ref 6.4–8.3)

## 2012-07-22 LAB — MAGNESIUM (CC13): Magnesium: 2.6 mg/dl — ABNORMAL HIGH (ref 1.5–2.5)

## 2012-07-22 MED ORDER — SODIUM CHLORIDE 0.9 % IV SOLN
Freq: Once | INTRAVENOUS | Status: AC
  Administered 2012-07-22: 11:00:00 via INTRAVENOUS

## 2012-07-22 MED ORDER — LEUCOVORIN CALCIUM INJECTION 350 MG
301.0000 mg/m2 | Freq: Once | INTRAVENOUS | Status: AC
  Administered 2012-07-22: 660 mg via INTRAVENOUS
  Filled 2012-07-22: qty 33

## 2012-07-22 MED ORDER — DIPHENHYDRAMINE HCL 50 MG/ML IJ SOLN
50.0000 mg | Freq: Once | INTRAMUSCULAR | Status: AC
  Administered 2012-07-22: 50 mg via INTRAVENOUS

## 2012-07-22 MED ORDER — IRINOTECAN HCL CHEMO INJECTION 100 MG/5ML
137.0000 mg/m2 | Freq: Once | INTRAVENOUS | Status: AC
  Administered 2012-07-22: 300 mg via INTRAVENOUS
  Filled 2012-07-22: qty 15

## 2012-07-22 MED ORDER — ATROPINE SULFATE 1 MG/ML IJ SOLN
0.5000 mg | Freq: Once | INTRAMUSCULAR | Status: AC | PRN
  Administered 2012-07-22: 0.5 mg via INTRAVENOUS

## 2012-07-22 MED ORDER — ATROPINE SULFATE 1 MG/ML IJ SOLN
0.5000 mg | Freq: Once | INTRAMUSCULAR | Status: AC
  Administered 2012-07-22: 0.5 mg via INTRAVENOUS

## 2012-07-22 MED ORDER — FLUOROURACIL CHEMO INJECTION 2.5 GM/50ML
300.0000 mg/m2 | Freq: Once | INTRAVENOUS | Status: AC
  Administered 2012-07-22: 650 mg via INTRAVENOUS
  Filled 2012-07-22: qty 13

## 2012-07-22 MED ORDER — DEXAMETHASONE SODIUM PHOSPHATE 4 MG/ML IJ SOLN
20.0000 mg | Freq: Once | INTRAMUSCULAR | Status: AC
  Administered 2012-07-22: 20 mg via INTRAVENOUS

## 2012-07-22 MED ORDER — CETUXIMAB CHEMO IV INJECTION 200 MG/100ML
500.0000 mg/m2 | Freq: Once | INTRAVENOUS | Status: AC
  Administered 2012-07-22: 1100 mg via INTRAVENOUS
  Filled 2012-07-22: qty 550

## 2012-07-22 MED ORDER — ONDANSETRON 16 MG/50ML IVPB (CHCC)
16.0000 mg | Freq: Once | INTRAVENOUS | Status: AC
  Administered 2012-07-22: 16 mg via INTRAVENOUS

## 2012-07-22 MED ORDER — FLUOROURACIL CHEMO INJECTION 5 GM/100ML
1800.0000 mg/m2 | INTRAVENOUS | Status: DC
  Administered 2012-07-22: 3950 mg via INTRAVENOUS
  Filled 2012-07-22: qty 79

## 2012-07-22 NOTE — Progress Notes (Signed)
Hematology and Oncology Follow Up Visit  [name] [id number] 09-Oct-1965 46 y.o. 07/22/2012 9:30 AM [name],[name], MDBurnett, [name], MD   Principle Diagnosis: The patient is a 47 year old man with recurrent KRAS wild-type  rectal cancer  Prior Therapy:  1. He was diagnosed with node-positive rectal cancer, for which he underwent proctectomy with colon anastomosis and ileostomy on August 09, 2010, at [facility] for a T3 N1 (1 of 18 nodes), moderately differentiated KRAS wild-type adenocarcinoma of the rectum.  2. He went on to receive 6 cycles of oxaliplatin-based therapy with 2 cycles FOLFOX and 4 cycles of XELOX.  3. He then completed external radiation therapy with continuous infusion 5-FU from April 16, 2011, through May 21, 2011.  4. He then underwent surveillance and was found to have recurrent tumor located to an aortocaval lymph node with an elevated CEA of 53 in September 2013. He began localized radiation therapy with CyberKnife concomitantly with Xeloda 1000 mg p.o. b.i.d. on 03/23/2012, completing therapy on 04/21/2012. His CEA level decreased. PET scan did show activity at the aortocaval node chain with persistent activity at the rectal anastomosis.   Current therapy: He was started on FOLFIRI with Erbitux on 06/17/12. He received 1 cycle which was complicated by diarrhea. Chemo has been on hold.His chemo was restarted at 24% dose reduction on 12/31. He is here for cycle 3.   Interim History:  Mr. [name] presents to the office today for routine follow-up with his wife. Chemotherapy has been well tolerated at reduced doses. Diarrhea has now resolved. He developed a rash due to Erbitux that has significantly improved at this time. He used Clindamycin gel to his face with improvement. Not routinely using Hydrocortisone or moisturizer at this time. Appetite has improved and he is gaining back the weight that he lost. Denies chest pain, shortness of breath, abdominal pain,  nausea, vomiting. He has not noticed any bleeding. He did a lot better with the reduced dose FOLFIRI.   Medications: I have reviewed the patient's current medications. Current outpatient prescriptions:clindamycin (CLINDAGEL) 1 % gel, Apply topically 2 (two) times daily. Apply to areas with rash., Disp: 60 g, Rfl: 1;  dicyclomine (BENTYL) 10 MG capsule, Take 10 mg by mouth 3 (three) times daily. , Disp: , Rfl: ;  doxycycline (VIBRA-TABS) 100 MG tablet, Take 1 tablet (100 mg total) by mouth 2 (two) times daily., Disp: 60 tablet, Rfl: 2;  GLUCOSAMINE HCL PO, Take 1 tablet by mouth daily., Disp: , Rfl:  loperamide (IMODIUM) 2 MG capsule, Take 2 mg by mouth 4 (four) times daily as needed. For diarrhea, Disp: , Rfl: ;  ondansetron (ZOFRAN) 8 MG tablet, Take 8 mg by mouth every 12 (twelve) hours as needed. Take 8 mg po BID  X 3  Days after chemo , and as needed for nausea., Disp: , Rfl: ;  ondansetron (ZOFRAN-ODT) 8 MG disintegrating tablet, , Disp: , Rfl:  oxyCODONE (OXY IR/ROXICODONE) 5 MG immediate release tablet, Take 5-15 mg by mouth every 4 (four) hours as needed. For pain, Disp: , Rfl: ;  prochlorperazine (COMPAZINE) 10 MG tablet, Take 10 mg by mouth every 6 (six) hours as needed. Nausea, Disp: , Rfl: ;  Pyridoxine HCl (VITAMIN B-6 PO), Take 1 tablet by mouth daily., Disp: , Rfl: ;  rosuvastatin (CRESTOR) 5 MG tablet, Take 5 mg by mouth at bedtime., Disp: , Rfl:   Allergies:  Allergies  Allergen Reactions  . Penicillins Itching    Intermittent reaction per  pt    Past Medical History, Surgical history, Social history, and Family History were reviewed and updated.  Review of Systems: Constitutional:  Negative for fever, chills, night sweats, anorexia, weight loss, pain. Cardiovascular: no chest pain or dyspnea on exertion Respiratory: no cough, shortness of breath, or wheezing Neurological: no TIA or stroke symptoms Dermatological: negative ENT: negative Skin: Negative. Gastrointestinal: no  abdominal pain, change in bowel habits, or black or bloody stools Genito-Urinary: no dysuria, trouble voiding, or hematuria Hematological and Lymphatic: negative Breast: negative for breast lumps Musculoskeletal: negative Remaining ROS negative.  Physical Exam: Blood pressure 121/73, pulse 77, temperature 98.7 F (37.1 C), temperature source Oral, resp. rate 20, height 5\' 11"  (1.803 m), weight 203 lb 4.8 oz (92.216 kg). ECOG: 0 General appearance: alert, cooperative and no distress Head: Normocephalic, without obvious abnormality, atraumatic Neck: no adenopathy, no carotid bruit, no JVD, supple, symmetrical, trachea midline and thyroid not enlarged, symmetric, no tenderness/mass/nodules Lymph nodes: Cervical, supraclavicular, and axillary nodes normal. Heart:regular rate and rhythm, S1, S2 normal, no murmur, click, rub or gallop Lung:chest clear, no wheezing, rales, normal symmetric air entry Abdomen: soft, non-tender, without masses or organomegaly EXT:no erythema, induration, or nodules   Lab Results: Lab Results  Component Value Date   WBC 6.4 07/22/2012   HGB 14.4 07/22/2012   HCT 41.4 07/22/2012   MCV 86.8 07/22/2012   PLT 109* 07/22/2012     Chemistry      Component Value Date/Time   NA 138 06/25/2012 0530   NA 140 05/29/2012 0955   NA 142 02/07/2012 0734   K 3.4* 06/25/2012 0530   K 4.3 05/29/2012 0955   K 4.5 02/07/2012 0734   CL 106 06/25/2012 0530   CL 111* 05/29/2012 0955   CL 104 02/07/2012 0734   CO2 26 06/25/2012 0530   CO2 25 05/29/2012 0955   CO2 26 02/07/2012 0734   BUN 4* 06/25/2012 0530   BUN 16.0 05/29/2012 0955   BUN 13 02/07/2012 0734   CREATININE 0.77 06/25/2012 0530   CREATININE 0.8 05/29/2012 0955   CREATININE 1.0 02/07/2012 0734      Component Value Date/Time   CALCIUM 8.1* 06/25/2012 0530   CALCIUM 9.3 05/29/2012 0955   CALCIUM 9.3 02/07/2012 0734   ALKPHOS 59 06/24/2012 0640   ALKPHOS 66 05/29/2012 0955   ALKPHOS 53 02/07/2012 0734   AST 13 06/24/2012  0640   AST 21 05/29/2012 0955   AST 31 02/07/2012 0734   ALT 19 06/24/2012 0640   ALT 28 05/29/2012 0955   BILITOT 0.5 06/24/2012 0640   BILITOT 0.94 05/29/2012 0955   BILITOT 0.80 02/07/2012 0734      Impression and Plan: This is a 47 year old gentleman with the following issues:  1. Recurrent KRAS wild-type rectal cancer. Currently receiving systemic chemotherapy with FOLFIRI/Erbitux. He developed grade 3 diarrhea and grade 2-3 rash with cycle 1. Did better with cycle 2. I would  recommend that he proceed with cycle 3 of his chemotherapy on 1/15. Erbitux will remain at the same dose. Irinotecan, 5-FU, and Leucovorin will be dose reduced by 25% due to diarrhea. He will proceed with cycle 4 on 1/29 and CT scan on 08/17/2012.   2. Neutropenia prophylaxis. On Neulasta with each cycle of chemo.  3. Skin rash. Due to Erbitux. Continue Clindamycin gel to face. On  Doxycycline 100 mg BID as well.  I have given him instructions regarding the use of Hydrocortisone, moisturizer, and sunscreen.  4. Diarrhea. Due  to chemotherapy. Resolved.   5. Nausea/vomiting prophylaxis. He has Zofran and Compazine at home.  6. Hyperlipidemia. On Crestor per PCP.  7. Follow-up. In 2 weeks prior to cycle 4.      Nova Evett 1/15/20149:30 AM

## 2012-07-22 NOTE — Patient Instructions (Signed)
Patient aware of next appointment; discharged home with no complaints. 

## 2012-07-22 NOTE — Telephone Encounter (Signed)
Gave pt apt for lab,MD, ML and chemo for January and February 2014, gave pt oral contrast for CT

## 2012-07-22 NOTE — Telephone Encounter (Signed)
Per staff message and POF I have scheduled appts.  JMW  

## 2012-07-24 ENCOUNTER — Ambulatory Visit (HOSPITAL_BASED_OUTPATIENT_CLINIC_OR_DEPARTMENT_OTHER): Payer: BC Managed Care – PPO

## 2012-07-24 VITALS — BP 121/79 | HR 76 | Temp 97.1°F

## 2012-07-24 DIAGNOSIS — C20 Malignant neoplasm of rectum: Secondary | ICD-10-CM

## 2012-07-24 DIAGNOSIS — Z5189 Encounter for other specified aftercare: Secondary | ICD-10-CM

## 2012-07-24 MED ORDER — PEGFILGRASTIM INJECTION 6 MG/0.6ML
6.0000 mg | Freq: Once | SUBCUTANEOUS | Status: AC
  Administered 2012-07-24: 6 mg via SUBCUTANEOUS
  Filled 2012-07-24: qty 0.6

## 2012-07-24 MED ORDER — HEPARIN SOD (PORK) LOCK FLUSH 100 UNIT/ML IV SOLN
500.0000 [IU] | Freq: Once | INTRAVENOUS | Status: AC | PRN
  Administered 2012-07-24: 500 [IU]
  Filled 2012-07-24: qty 5

## 2012-07-24 MED ORDER — SODIUM CHLORIDE 0.9 % IJ SOLN
10.0000 mL | INTRAMUSCULAR | Status: DC | PRN
  Administered 2012-07-24: 10 mL
  Filled 2012-07-24: qty 10

## 2012-07-24 NOTE — Progress Notes (Signed)
Patient he is sore above the port site. No redness, drainage or swelling noted. Port flushes easily with positive blood flow. Clenton Pare, PA notified. Patient instructed to call the office or report to the Emergency Room if the port becomes red, swollen painful or has discharge. Patient verbalized understanding of the above instructions.

## 2012-08-04 ENCOUNTER — Ambulatory Visit (HOSPITAL_BASED_OUTPATIENT_CLINIC_OR_DEPARTMENT_OTHER): Payer: BC Managed Care – PPO | Admitting: Oncology

## 2012-08-04 ENCOUNTER — Encounter: Payer: Self-pay | Admitting: Oncology

## 2012-08-04 ENCOUNTER — Other Ambulatory Visit (HOSPITAL_BASED_OUTPATIENT_CLINIC_OR_DEPARTMENT_OTHER): Payer: BC Managed Care – PPO

## 2012-08-04 VITALS — BP 143/91 | HR 77 | Temp 97.0°F | Resp 77 | Ht 71.0 in | Wt 202.6 lb

## 2012-08-04 DIAGNOSIS — C20 Malignant neoplasm of rectum: Secondary | ICD-10-CM

## 2012-08-04 DIAGNOSIS — G620 Drug-induced polyneuropathy: Secondary | ICD-10-CM

## 2012-08-04 LAB — CBC WITH DIFFERENTIAL/PLATELET
Basophils Absolute: 0.1 10*3/uL (ref 0.0–0.1)
Eosinophils Absolute: 0.2 10*3/uL (ref 0.0–0.5)
HGB: 15.4 g/dL (ref 13.0–17.1)
MCV: 86.2 fL (ref 79.3–98.0)
MONO#: 0.8 10*3/uL (ref 0.1–0.9)
MONO%: 9 % (ref 0.0–14.0)
NEUT#: 7.2 10*3/uL — ABNORMAL HIGH (ref 1.5–6.5)
RBC: 5.11 10*6/uL (ref 4.20–5.82)
RDW: 14.7 % — ABNORMAL HIGH (ref 11.0–14.6)
WBC: 9.2 10*3/uL (ref 4.0–10.3)
lymph#: 1 10*3/uL (ref 0.9–3.3)

## 2012-08-04 MED ORDER — OXYCODONE HCL 5 MG PO TABS: 5.0000 mg | ORAL_TABLET | ORAL | Status: DC | PRN

## 2012-08-04 NOTE — Progress Notes (Signed)
Hematology and Oncology Follow Up Visit  [name] [id number] 1965/09/22 47 y.o. 08/04/2012 5:16 PM [name],[name], MDBurnett, [name], MD   Principle Diagnosis: The patient is a 47 year old man with recurrent KRAS wild-type  rectal cancer  Prior Therapy:  1. He was diagnosed with node-positive rectal cancer, for which he underwent proctectomy with colon anastomosis and ileostomy on August 09, 2010, at [facility] for a T3 N1 (1 of 18 nodes), moderately differentiated KRAS wild-type adenocarcinoma of the rectum.  2. He went on to receive 6 cycles of oxaliplatin-based therapy with 2 cycles FOLFOX and 4 cycles of XELOX.  3. He then completed external radiation therapy with continuous infusion 5-FU from April 16, 2011, through May 21, 2011.  4. He then underwent surveillance and was found to have recurrent tumor located to an aortocaval lymph node with an elevated CEA of 53 in September 2013. He began localized radiation therapy with CyberKnife concomitantly with Xeloda 1000 mg p.o. b.i.d. on 03/23/2012, completing therapy on 04/21/2012. His CEA level decreased. PET scan did show activity at the aortocaval node chain with persistent activity at the rectal anastomosis.   Current therapy: He was started on FOLFIRI with Erbitux on 06/17/12. He received 1 cycle which was complicated by diarrhea. Chemo has been on hold.His chemo was restarted at 24% dose reduction on 12/31. He is here for cycle 4.   Interim History:  [name] presents to the office today for routine follow-up by himself. Chemotherapy has been well tolerated at reduced doses. Diarrhea has now resolved. He developed a rash due to Erbitux that has significantly improved at this time. He used Clindamycin gel to his face with improvement. Not routinely using Hydrocortisone or moisturizer at this time. Appetite has improved and he is gaining back the weight that he lost. Denies chest pain, shortness of breath, abdominal pain,  nausea, vomiting. He has not noticed any bleeding. He did a lot better with the reduced dose FOLFIRI.   Medications: I have reviewed the patient's current medications. Current outpatient prescriptions:clindamycin (CLINDAGEL) 1 % gel, Apply topically 2 (two) times daily. Apply to areas with rash., Disp: 60 g, Rfl: 1;  dicyclomine (BENTYL) 10 MG capsule, Take 10 mg by mouth 3 (three) times daily. , Disp: , Rfl: ;  doxycycline (VIBRA-TABS) 100 MG tablet, Take 1 tablet (100 mg total) by mouth 2 (two) times daily., Disp: 60 tablet, Rfl: 2;  GLUCOSAMINE HCL PO, Take 1 tablet by mouth daily., Disp: , Rfl:  loperamide (IMODIUM) 2 MG capsule, Take 2 mg by mouth 4 (four) times daily as needed. For diarrhea, Disp: , Rfl: ;  ondansetron (ZOFRAN) 8 MG tablet, Take 8 mg by mouth every 12 (twelve) hours as needed. Take 8 mg po BID  X 3  Days after chemo , and as needed for nausea., Disp: , Rfl: ;  ondansetron (ZOFRAN-ODT) 8 MG disintegrating tablet, , Disp: , Rfl:  oxyCODONE (OXY IR/ROXICODONE) 5 MG immediate release tablet, Take 1 tablet (5 mg total) by mouth every 4 (four) hours as needed. For pain, Disp: 90 tablet, Rfl: 0;  prochlorperazine (COMPAZINE) 10 MG tablet, Take 10 mg by mouth every 6 (six) hours as needed. Nausea, Disp: , Rfl: ;  Pyridoxine HCl (VITAMIN B-6 PO), Take 1 tablet by mouth daily., Disp: , Rfl: ;  rosuvastatin (CRESTOR) 5 MG tablet, Take 5 mg by mouth at bedtime., Disp: , Rfl:   Allergies:  Allergies  Allergen Reactions  . Penicillins Itching  Intermittent reaction per pt    Past Medical History, Surgical history, Social history, and Family History were reviewed and updated.  Review of Systems: Constitutional:  Negative for fever, chills, night sweats, anorexia, weight loss, pain. Cardiovascular: no chest pain or dyspnea on exertion Respiratory: no cough, shortness of breath, or wheezing Neurological: no TIA or stroke symptoms Dermatological: negative ENT: negative Skin:  Negative. Gastrointestinal: no abdominal pain, change in bowel habits, or black or bloody stools Genito-Urinary: no dysuria, trouble voiding, or hematuria Hematological and Lymphatic: negative Breast: negative for breast lumps Musculoskeletal: negative Remaining ROS negative.  Physical Exam: Blood pressure 143/91, pulse 77, temperature 97 F (36.1 C), temperature source Oral, resp. rate 77, height 5\' 11"  (1.803 m), weight 202 lb 9.6 oz (91.899 kg). ECOG: 0 General appearance: alert, cooperative and no distress Head: Normocephalic, without obvious abnormality, atraumatic Neck: no adenopathy, no carotid bruit, no JVD, supple, symmetrical, trachea midline and thyroid not enlarged, symmetric, no tenderness/mass/nodules Lymph nodes: Cervical, supraclavicular, and axillary nodes normal. Heart:regular rate and rhythm, S1, S2 normal, no murmur, click, rub or gallop Lung:chest clear, no wheezing, rales, normal symmetric air entry Abdomen: soft, non-tender, without masses or organomegaly EXT:no erythema, induration, or nodules   Lab Results: Lab Results  Component Value Date   WBC 9.2 08/04/2012   HGB 15.4 08/04/2012   HCT 44.1 08/04/2012   MCV 86.2 08/04/2012   PLT 148 08/04/2012     Chemistry      Component Value Date/Time   NA 141 07/22/2012 0851   NA 138 06/25/2012 0530   NA 142 02/07/2012 0734   K 3.8 07/22/2012 0851   K 3.4* 06/25/2012 0530   K 4.5 02/07/2012 0734   CL 104 07/22/2012 0851   CL 106 06/25/2012 0530   CL 104 02/07/2012 0734   CO2 26 07/22/2012 0851   CO2 26 06/25/2012 0530   CO2 26 02/07/2012 0734   BUN 8.0 07/22/2012 0851   BUN 4* 06/25/2012 0530   BUN 13 02/07/2012 0734   CREATININE 0.8 07/22/2012 0851   CREATININE 0.77 06/25/2012 0530   CREATININE 1.0 02/07/2012 0734      Component Value Date/Time   CALCIUM 9.2 07/22/2012 0851   CALCIUM 8.1* 06/25/2012 0530   CALCIUM 9.3 02/07/2012 0734   ALKPHOS 109 07/22/2012 0851   ALKPHOS 59 06/24/2012 0640   ALKPHOS 53 02/07/2012 0734    AST 22 07/22/2012 0851   AST 13 06/24/2012 0640   AST 31 02/07/2012 0734   ALT 33 07/22/2012 0851   ALT 19 06/24/2012 0640   BILITOT 0.63 07/22/2012 0851   BILITOT 0.5 06/24/2012 0640   BILITOT 0.80 02/07/2012 0734      Impression and Plan: This is a 47 year old gentleman with the following issues:  1. Recurrent KRAS wild-type rectal cancer. Currently receiving systemic chemotherapy with FOLFIRI/Erbitux. He developed grade 3 diarrhea and grade 2-3 rash with cycle 1. Did better with subsequent cycles with dose reduction. I would  recommend that he proceed with cycle 4 of his chemotherapy on 08/05/12. Erbitux will remain at the same dose. Irinotecan, 5-FU, and Leucovorin will be dose reduced by 25% due to diarrhea. He will proceed with cycle 4 on 1/29 and CT scan on 08/17/2012.   2. Neutropenia prophylaxis. On Neulasta with each cycle of chemo.  3. Skin rash. Due to Erbitux. Continue Clindamycin gel to face. On  Doxycycline 100 mg BID as well.  I have given him instructions regarding the use of Hydrocortisone, moisturizer,  and sunscreen.  4. Diarrhea. Due to chemotherapy. Resolved.   5. Nausea/vomiting prophylaxis. He has Zofran and Compazine at home.  6. Hyperlipidemia. On Crestor per PCP.  7. Follow-up. In 2 weeks prior to cycle 5.      [place], [place] 1/28/20145:16 PM

## 2012-08-05 ENCOUNTER — Ambulatory Visit (HOSPITAL_BASED_OUTPATIENT_CLINIC_OR_DEPARTMENT_OTHER): Payer: BC Managed Care – PPO

## 2012-08-05 ENCOUNTER — Other Ambulatory Visit: Payer: BC Managed Care – PPO | Admitting: Lab

## 2012-08-05 VITALS — BP 135/87 | HR 89 | Temp 97.6°F

## 2012-08-05 DIAGNOSIS — Z5112 Encounter for antineoplastic immunotherapy: Secondary | ICD-10-CM

## 2012-08-05 DIAGNOSIS — C20 Malignant neoplasm of rectum: Secondary | ICD-10-CM

## 2012-08-05 DIAGNOSIS — Z5111 Encounter for antineoplastic chemotherapy: Secondary | ICD-10-CM

## 2012-08-05 MED ORDER — ONDANSETRON 16 MG/50ML IVPB (CHCC)
16.0000 mg | Freq: Once | INTRAVENOUS | Status: AC
  Administered 2012-08-05: 16 mg via INTRAVENOUS

## 2012-08-05 MED ORDER — FLUOROURACIL CHEMO INJECTION 2.5 GM/50ML
300.0000 mg/m2 | Freq: Once | INTRAVENOUS | Status: AC
  Administered 2012-08-05: 650 mg via INTRAVENOUS
  Filled 2012-08-05: qty 13

## 2012-08-05 MED ORDER — SODIUM CHLORIDE 0.9 % IJ SOLN
10.0000 mL | INTRAMUSCULAR | Status: DC | PRN
  Filled 2012-08-05: qty 10

## 2012-08-05 MED ORDER — ATROPINE SULFATE 1 MG/ML IJ SOLN
1.0000 mg | Freq: Once | INTRAMUSCULAR | Status: AC | PRN
  Administered 2012-08-05: 1 mg via INTRAVENOUS

## 2012-08-05 MED ORDER — LEUCOVORIN CALCIUM INJECTION 350 MG
301.0000 mg/m2 | Freq: Once | INTRAVENOUS | Status: AC
  Administered 2012-08-05: 660 mg via INTRAVENOUS
  Filled 2012-08-05: qty 33

## 2012-08-05 MED ORDER — SODIUM CHLORIDE 0.9 % IV SOLN
1800.0000 mg/m2 | INTRAVENOUS | Status: DC
  Administered 2012-08-05: 3950 mg via INTRAVENOUS
  Filled 2012-08-05: qty 79

## 2012-08-05 MED ORDER — CETUXIMAB CHEMO IV INJECTION 200 MG/100ML
500.0000 mg/m2 | Freq: Once | INTRAVENOUS | Status: AC
  Administered 2012-08-05: 1100 mg via INTRAVENOUS
  Filled 2012-08-05: qty 550

## 2012-08-05 MED ORDER — DEXAMETHASONE SODIUM PHOSPHATE 4 MG/ML IJ SOLN
20.0000 mg | Freq: Once | INTRAMUSCULAR | Status: AC
  Administered 2012-08-05: 20 mg via INTRAVENOUS

## 2012-08-05 MED ORDER — IRINOTECAN HCL CHEMO INJECTION 100 MG/5ML
137.0000 mg/m2 | Freq: Once | INTRAVENOUS | Status: AC
  Administered 2012-08-05: 300 mg via INTRAVENOUS
  Filled 2012-08-05: qty 15

## 2012-08-05 MED ORDER — DIPHENHYDRAMINE HCL 50 MG/ML IJ SOLN
50.0000 mg | Freq: Once | INTRAMUSCULAR | Status: AC
  Administered 2012-08-05: 50 mg via INTRAVENOUS

## 2012-08-05 MED ORDER — SODIUM CHLORIDE 0.9 % IV SOLN
Freq: Once | INTRAVENOUS | Status: AC
  Administered 2012-08-05: 11:00:00 via INTRAVENOUS

## 2012-08-05 MED ORDER — SODIUM CHLORIDE 0.9 % IV SOLN
Freq: Once | INTRAVENOUS | Status: AC
  Administered 2012-08-05: 13:00:00 via INTRAVENOUS

## 2012-08-05 NOTE — Patient Instructions (Signed)
[facility] Discharge Instructions for Patients Receiving Chemotherapy  Today you received the following chemotherapy agents: erbitux, irinotecan, leucovorin, 59fu, 5 fu pump.  To help prevent nausea and vomiting after your treatment, we encourage you to take your nausea medication. Take it as often as prescribed.     If you develop nausea and vomiting that is not controlled by your nausea medication, call the clinic. If it is after clinic hours your family physician or the after hours number for the clinic or go to the Emergency Department.   BELOW ARE SYMPTOMS THAT SHOULD BE REPORTED IMMEDIATELY:  *FEVER GREATER THAN 100.5 F  *CHILLS WITH OR WITHOUT FEVER  NAUSEA AND VOMITING THAT IS NOT CONTROLLED WITH YOUR NAUSEA MEDICATION  *UNUSUAL SHORTNESS OF BREATH  *UNUSUAL BRUISING OR BLEEDING  TENDERNESS IN MOUTH AND THROAT WITH OR WITHOUT PRESENCE OF ULCERS  *URINARY PROBLEMS  *BOWEL PROBLEMS  UNUSUAL RASH Items with * indicate a potential emergency and should be followed up as soon as possible.  Feel free to call the clinic you have any questions or concerns. The clinic phone number is [phone].   I have been informed and understand all the instructions given to me. I know to contact the clinic, my physician, or go to the Emergency Department if any problems should occur. I do not have any questions at this time, but understand that I may call the clinic during office hours   should I have any questions or need assistance in obtaining follow up care.    __________________________________________  _____________  __________ Signature of Patient or Authorized Representative            Date                   Time    __________________________________________ Nurse's Signature

## 2012-08-06 ENCOUNTER — Other Ambulatory Visit: Payer: Self-pay | Admitting: Certified Registered Nurse Anesthetist

## 2012-08-07 ENCOUNTER — Ambulatory Visit (HOSPITAL_BASED_OUTPATIENT_CLINIC_OR_DEPARTMENT_OTHER): Payer: BC Managed Care – PPO

## 2012-08-07 VITALS — BP 120/83 | HR 101 | Temp 98.0°F

## 2012-08-07 DIAGNOSIS — C20 Malignant neoplasm of rectum: Secondary | ICD-10-CM

## 2012-08-07 MED ORDER — HEPARIN SOD (PORK) LOCK FLUSH 100 UNIT/ML IV SOLN
500.0000 [IU] | Freq: Once | INTRAVENOUS | Status: AC | PRN
  Administered 2012-08-07: 500 [IU]
  Filled 2012-08-07: qty 5

## 2012-08-07 MED ORDER — PEGFILGRASTIM INJECTION 6 MG/0.6ML
6.0000 mg | Freq: Once | SUBCUTANEOUS | Status: AC
  Administered 2012-08-07: 6 mg via SUBCUTANEOUS
  Filled 2012-08-07: qty 0.6

## 2012-08-07 MED ORDER — SODIUM CHLORIDE 0.9 % IJ SOLN
10.0000 mL | INTRAMUSCULAR | Status: DC | PRN
  Administered 2012-08-07: 10 mL
  Filled 2012-08-07: qty 10

## 2012-08-17 ENCOUNTER — Encounter (HOSPITAL_COMMUNITY): Payer: Self-pay

## 2012-08-17 ENCOUNTER — Ambulatory Visit (HOSPITAL_COMMUNITY)
Admission: RE | Admit: 2012-08-17 | Discharge: 2012-08-17 | Disposition: A | Discharge status: Home / Self Care | Payer: BC Managed Care – PPO | Source: Ambulatory Visit | Attending: Oncology | Admitting: Oncology

## 2012-08-17 DIAGNOSIS — Z923 Personal history of irradiation: Secondary | ICD-10-CM | POA: Insufficient documentation

## 2012-08-17 DIAGNOSIS — N200 Calculus of kidney: Secondary | ICD-10-CM | POA: Insufficient documentation

## 2012-08-17 DIAGNOSIS — K7689 Other specified diseases of liver: Secondary | ICD-10-CM | POA: Insufficient documentation

## 2012-08-17 DIAGNOSIS — Z9089 Acquired absence of other organs: Secondary | ICD-10-CM | POA: Insufficient documentation

## 2012-08-17 DIAGNOSIS — Z9049 Acquired absence of other specified parts of digestive tract: Secondary | ICD-10-CM | POA: Insufficient documentation

## 2012-08-17 DIAGNOSIS — C20 Malignant neoplasm of rectum: Secondary | ICD-10-CM | POA: Insufficient documentation

## 2012-08-17 DIAGNOSIS — I700 Atherosclerosis of aorta: Secondary | ICD-10-CM | POA: Insufficient documentation

## 2012-08-17 DIAGNOSIS — R918 Other nonspecific abnormal finding of lung field: Secondary | ICD-10-CM | POA: Insufficient documentation

## 2012-08-17 DIAGNOSIS — Z79899 Other long term (current) drug therapy: Secondary | ICD-10-CM | POA: Insufficient documentation

## 2012-08-17 MED ORDER — IOHEXOL 300 MG/ML  SOLN
100.0000 mL | Freq: Once | INTRAMUSCULAR | Status: AC | PRN
  Administered 2012-08-17: 100 mL via INTRAVENOUS

## 2012-08-18 ENCOUNTER — Encounter: Payer: Self-pay | Admitting: Radiation Oncology

## 2012-08-18 ENCOUNTER — Ambulatory Visit (HOSPITAL_BASED_OUTPATIENT_CLINIC_OR_DEPARTMENT_OTHER): Payer: BC Managed Care – PPO | Admitting: Lab

## 2012-08-18 ENCOUNTER — Other Ambulatory Visit: Payer: BC Managed Care – PPO | Admitting: Lab

## 2012-08-18 ENCOUNTER — Ambulatory Visit (HOSPITAL_BASED_OUTPATIENT_CLINIC_OR_DEPARTMENT_OTHER): Payer: BC Managed Care – PPO | Admitting: Oncology

## 2012-08-18 ENCOUNTER — Telehealth: Payer: Self-pay | Admitting: Oncology

## 2012-08-18 VITALS — BP 146/77 | HR 84 | Temp 97.5°F | Resp 20 | Ht 71.0 in | Wt 203.4 lb

## 2012-08-18 DIAGNOSIS — E785 Hyperlipidemia, unspecified: Secondary | ICD-10-CM

## 2012-08-18 DIAGNOSIS — C20 Malignant neoplasm of rectum: Secondary | ICD-10-CM

## 2012-08-18 DIAGNOSIS — L270 Generalized skin eruption due to drugs and medicaments taken internally: Secondary | ICD-10-CM

## 2012-08-18 DIAGNOSIS — C19 Malignant neoplasm of rectosigmoid junction: Secondary | ICD-10-CM

## 2012-08-18 LAB — CBC WITH DIFFERENTIAL/PLATELET
Basophils Absolute: 0 10*3/uL (ref 0.0–0.1)
Eosinophils Absolute: 0.1 10*3/uL (ref 0.0–0.5)
HGB: 14.2 g/dL (ref 13.0–17.1)
MCV: 85.5 fL (ref 79.3–98.0)
MONO#: 0.5 10*3/uL (ref 0.1–0.9)
MONO%: 6.4 % (ref 0.0–14.0)
NEUT#: 6.3 10*3/uL (ref 1.5–6.5)
RBC: 4.78 10*6/uL (ref 4.20–5.82)
RDW: 14.8 % — ABNORMAL HIGH (ref 11.0–14.6)
WBC: 7.7 10*3/uL (ref 4.0–10.3)
lymph#: 0.7 10*3/uL — ABNORMAL LOW (ref 0.9–3.3)

## 2012-08-18 LAB — COMPREHENSIVE METABOLIC PANEL (CC13)
Albumin: 3.5 g/dL (ref 3.5–5.0)
Alkaline Phosphatase: 115 U/L (ref 40–150)
Calcium: 9.1 mg/dL (ref 8.4–10.4)
Chloride: 106 mEq/L (ref 98–107)
Glucose: 122 mg/dl — ABNORMAL HIGH (ref 70–99)
Potassium: 3.5 mEq/L (ref 3.5–5.1)
Sodium: 142 mEq/L (ref 136–145)
Total Protein: 6.6 g/dL (ref 6.4–8.3)

## 2012-08-18 NOTE — Progress Notes (Signed)
Hematology and Oncology Follow Up Visit  [name] [id number] [birth year] 47 y.o. 08/18/2012 4:39 PM EricBRENT Steele, MDBurnett, Eric Bern, MD   Principle Diagnosis: The patient is Steele 47 year old man with recurrent KRAS wild-type  rectal cancer  Prior Therapy:  1. He was diagnosed with node-positive rectal cancer, for which he underwent proctectomy with colon anastomosis and ileostomy on August 09, 2010, at [facility] for Steele T3 N1 (1 of 18 nodes), moderately differentiated KRAS wild-type adenocarcinoma of the rectum.  2. He went on to receive 6 cycles of oxaliplatin-based therapy with 2 cycles FOLFOX and 4 cycles of XELOX.  3. He then completed external radiation therapy with continuous infusion 5-FU from April 16, 2011, through May 21, 2011.  4. He then underwent surveillance and was found to have recurrent tumor located to an aortocaval lymph node with an elevated CEA of 53 in September 2013. He began localized radiation therapy with CyberKnife concomitantly with Xeloda 1000 mg p.o. b.i.d. on 03/23/2012, completing therapy on 04/21/2012. His CEA level decreased. PET scan did show activity at the aortocaval node chain with persistent activity at the rectal anastomosis.   Current therapy: He was started on FOLFIRI with Erbitux on 06/17/12. He received 1 cycle which was complicated by diarrhea. Chemo has been on hold.His chemo was restarted at 24% dose reduction on 12/31. He is here for cycle 5.   Interim History:  [name] presents to the office today for routine follow-up. Chemotherapy has been well tolerated at reduced doses. Diarrhea has now resolved. He developed Steele rash due to Erbitux that has significantly improved at this time. He used Clindamycin gel to his face with improvement. Not routinely using Hydrocortisone or moisturizer at this time. Appetite has improved and he is gaining back the weight that he lost. Denies chest pain, shortness of breath, abdominal pain, nausea,  vomiting. He has not noticed any bleeding. He does report changes in his bowel habits alternating between constipation and diarrhea.   Medications: I have reviewed the patient's current medications. Current outpatient prescriptions:clindamycin (CLINDAGEL) 1 % gel, Apply topically 2 (two) times daily. Apply to areas with rash., Disp: 60 g, Rfl: 1;  dicyclomine (BENTYL) 10 MG capsule, Take 10 mg by mouth 3 (three) times daily. , Disp: , Rfl: ;  doxycycline (VIBRA-TABS) 100 MG tablet, Take 1 tablet (100 mg total) by mouth 2 (two) times daily., Disp: 60 tablet, Rfl: 2;  GLUCOSAMINE HCL PO, Take 1 tablet by mouth daily., Disp: , Rfl:  loperamide (IMODIUM) 2 MG capsule, Take 2 mg by mouth 4 (four) times daily as needed. For diarrhea, Disp: , Rfl: ;  ondansetron (ZOFRAN) 8 MG tablet, Take 8 mg by mouth every 12 (twelve) hours as needed. Take 8 mg po BID  X 3  Days after chemo , and as needed for nausea., Disp: , Rfl: ;  ondansetron (ZOFRAN-ODT) 8 MG disintegrating tablet, , Disp: , Rfl:  oxyCODONE (OXY IR/ROXICODONE) 5 MG immediate release tablet, Take 1 tablet (5 mg total) by mouth every 4 (four) hours as needed. For pain, Disp: 90 tablet, Rfl: 0;  prochlorperazine (COMPAZINE) 10 MG tablet, Take 10 mg by mouth every 6 (six) hours as needed. Nausea, Disp: , Rfl: ;  Pyridoxine HCl (VITAMIN B-6 PO), Take 1 tablet by mouth daily., Disp: , Rfl: ;  rosuvastatin (CRESTOR) 5 MG tablet, Take 5 mg by mouth at bedtime., Disp: , Rfl:  No current facility-administered medications for this visit. Facility-Administered Medications Ordered in Other Visits:  fluorouracil (ADRUCIL) 3,950 mg in sodium chloride 0.9 % 150 mL chemo infusion, 1,800 mg/m2 (Treatment Plan Actual), Intravenous, 1 day or 1 dose, Myrtis Ser, NP, 3,950 mg at 08/05/12 1516;  sodium chloride 0.9 % injection 10 mL, 10 mL, Intracatheter, PRN, Benjiman Core, MD;  sodium chloride 0.9 % injection 10 mL, 10 mL, Intracatheter, PRN, Myrtis Ser,  NP  Allergies:  Allergies  Allergen Reactions  . Penicillins Itching    Intermittent reaction per pt    Past Medical History, Surgical history, Social history, and Family History were reviewed and updated.  Review of Systems: Constitutional:  Negative for fever, chills, night sweats, anorexia, weight loss, pain. Cardiovascular: no chest pain or dyspnea on exertion Respiratory: no cough, shortness of breath, or wheezing Neurological: no TIA or stroke symptoms Dermatological: negative ENT: negative Skin: Negative. Gastrointestinal: no abdominal pain, change in bowel habits, or black or bloody stools Genito-Urinary: no dysuria, trouble voiding, or hematuria Hematological and Lymphatic: negative Breast: negative for breast lumps Musculoskeletal: negative Remaining ROS negative.  Physical Exam: Blood pressure 146/77, pulse 84, temperature 97.5 F (36.4 C), temperature source Oral, resp. rate 20, height 5\' 11"  (1.803 m), weight 203 lb 6.4 oz (92.262 kg). ECOG: 0 General appearance: alert, cooperative and no distress Head: Normocephalic, without obvious abnormality, atraumatic Neck: no adenopathy, no carotid bruit, no JVD, supple, symmetrical, trachea midline and thyroid not enlarged, symmetric, no tenderness/mass/nodules Lymph nodes: Cervical, supraclavicular, and axillary nodes normal. Heart:regular rate and rhythm, S1, S2 normal, no murmur, click, rub or gallop Lung:chest clear, no wheezing, rales, normal symmetric air entry Abdomen: soft, non-tender, without masses or organomegaly EXT:no erythema, induration, or nodules   Lab Results: Lab Results  Component Value Date   WBC 7.7 08/18/2012   HGB 14.2 08/18/2012   HCT 40.9 08/18/2012   MCV 85.5 08/18/2012   PLT 140 08/18/2012     Chemistry      Component Value Date/Time   NA 142 08/18/2012 1557   NA 138 06/25/2012 0530   NA 142 02/07/2012 0734   K 3.5 08/18/2012 1557   K 3.4* 06/25/2012 0530   K 4.5 02/07/2012 0734   CL 106  08/18/2012 1557   CL 106 06/25/2012 0530   CL 104 02/07/2012 0734   CO2 28 08/18/2012 1557   CO2 26 06/25/2012 0530   CO2 26 02/07/2012 0734   BUN 4.6* 08/18/2012 1557   BUN 4* 06/25/2012 0530   BUN 13 02/07/2012 0734   CREATININE 0.8 08/18/2012 1557   CREATININE 0.77 06/25/2012 0530   CREATININE 1.0 02/07/2012 0734      Component Value Date/Time   CALCIUM 9.1 08/18/2012 1557   CALCIUM 8.1* 06/25/2012 0530   CALCIUM 9.3 02/07/2012 0734   ALKPHOS 115 08/18/2012 1557   ALKPHOS 59 06/24/2012 0640   ALKPHOS 53 02/07/2012 0734   AST 23 08/18/2012 1557   AST 13 06/24/2012 0640   AST 31 02/07/2012 0734   ALT 41 08/18/2012 1557   ALT 19 06/24/2012 0640   BILITOT 0.53 08/18/2012 1557   BILITOT 0.5 06/24/2012 0640   BILITOT 0.80 02/07/2012 0734     CT CHEST, ABDOMEN AND PELVIS WITH CONTRAST  Technique: Contiguous axial images of the chest abdomen and pelvis  were obtained after IV contrast administration.  Contrast: 100 ml Omnipaque-300  Comparison: P E T of 05/29/2012 and 02/13/2012. Most recent  diagnostic CTs of 02/15/2012.  CT CHEST  Findings: Lung windows demonstrate 4 mm right middle lobe lung  nodule on image 34/series 4. Appears slightly more conspicuous  today, favored to be due to differences in slice selection.  More inferolateral right middle lobe 4 mm nodule on image 37/series  4 is unchanged. 2 mm left upper lobe nodule on image 19/series 4  is unchanged. No new nodules.  Soft tissue windows demonstrate no supraclavicular adenopathy. Steele  right-sided Port-Steele-Cath which terminates at the low SVC.  Normal heart size without pericardial or pleural effusion. No  central pulmonary embolism, on this non-dedicated study. No  mediastinal or hilar adenopathy.  IMPRESSION:  1. No acute process or evidence of metastatic disease in the chest.  2. Similar bilateral small pulmonary nodules.  CT ABDOMEN AND PELVIS  Findings: Moderate hepatic steatosis, without focal hepatic  lesion. Steele splenule. Normal  stomach, pancreas. Cholecystectomy  without biliary ductal dilatation. Normal adrenal glands and  kidneys. The right renal calculus described on 02/07/2012 is now  positioned within the distal right ureter on image 114/series 2.  This measures approximately 4 mm.  Mildly age advanced aortic atherosclerosis. No retroperitoneal or  retrocrural adenopathy.  Steele precaval node measures 6 mm on image 84/series 2 versus 7 mm on  05/29/2012. Immediately cephalad precaval node measures 5 mm on  image 81/series 2 versus 7 mm on 05/29/2012.  Normal colon, appendix, and terminal ileum. Enterotomy on image  108/series 2. Small bowel otherwise normal, without ascites. No  evidence of omental or peritoneal disease.  No pelvic sidewall adenopathy. Normal urinary bladder and prostate.  No significant free fluid. Similar presacral fascial thickening  which is likely postoperative. No well-defined mass to suggest  recurrent disease.  No acute osseous abnormality. Disk bulges at L3-L4 and L4-L5.  IMPRESSION:  1. Further decreased size of retroperitoneal nodes since  05/29/2012 PET.  2. No new or progressive disease.  3. Hepatic steatosis.  4. Right-sided urinary tract calculus now positioned within the  distal right ureter. No significant proximal obstruction.     Impression and Plan: This is Steele 47 year old gentleman with the following issues:  1. Recurrent KRAS wild-type rectal cancer. Currently receiving systemic chemotherapy with FOLFIRI/Erbitux. He developed grade 3 diarrhea and grade 2-3 rash with cycle 1. Did better with subsequent cycles with dose reduction.  CT scans reviewed today with the patient and it appears to have good response.  I would  recommend that he proceed with cycle 5 and 6 and repeat his scans in 2 months. I would not treat him after cycle 6 unless he relapses.    2. Neutropenia prophylaxis. On Neulasta with each cycle of chemo.  3. Skin rash. Due to Erbitux. Continue  Clindamycin gel to face. On  Doxycycline 100 mg BID as well.  I have given him instructions regarding the use of Hydrocortisone, moisturizer, and sunscreen.  4. Diarrhea. Due to chemotherapy. Resolved.   5. Nausea/vomiting prophylaxis. He has Zofran and Compazine at home.  6. Hyperlipidemia. On Crestor per PCP.  7. Follow-up. In 2 weeks prior to cycle 6 and CT scan after that in 6 weeks.       [facility] 2/11/20144:39 PM

## 2012-08-18 NOTE — Telephone Encounter (Signed)
Gave pt appt for lab, ML, and d/c pump, emailed Marcelino Duster regarding chemo

## 2012-08-19 ENCOUNTER — Ambulatory Visit (HOSPITAL_BASED_OUTPATIENT_CLINIC_OR_DEPARTMENT_OTHER): Payer: BC Managed Care – PPO

## 2012-08-19 ENCOUNTER — Other Ambulatory Visit: Payer: BC Managed Care – PPO | Admitting: Lab

## 2012-08-19 ENCOUNTER — Telehealth: Payer: Self-pay | Admitting: *Deleted

## 2012-08-19 VITALS — BP 122/75 | HR 81 | Temp 97.1°F

## 2012-08-19 DIAGNOSIS — C20 Malignant neoplasm of rectum: Secondary | ICD-10-CM

## 2012-08-19 DIAGNOSIS — Z5112 Encounter for antineoplastic immunotherapy: Secondary | ICD-10-CM

## 2012-08-19 MED ORDER — LEUCOVORIN CALCIUM INJECTION 350 MG
301.0000 mg/m2 | Freq: Once | INTRAVENOUS | Status: AC
  Administered 2012-08-19: 660 mg via INTRAVENOUS
  Filled 2012-08-19: qty 33

## 2012-08-19 MED ORDER — DIPHENHYDRAMINE HCL 50 MG/ML IJ SOLN
50.0000 mg | Freq: Once | INTRAMUSCULAR | Status: AC
  Administered 2012-08-19: 50 mg via INTRAVENOUS

## 2012-08-19 MED ORDER — SODIUM CHLORIDE 0.9 % IV SOLN: Freq: Once | INTRAVENOUS | Status: DC

## 2012-08-19 MED ORDER — SODIUM CHLORIDE 0.9 % IJ SOLN
10.0000 mL | INTRAMUSCULAR | Status: DC | PRN
  Filled 2012-08-19: qty 10

## 2012-08-19 MED ORDER — ATROPINE SULFATE 1 MG/ML IJ SOLN
1.0000 mg | Freq: Once | INTRAMUSCULAR | Status: AC | PRN
  Administered 2012-08-19: 1 mg via INTRAVENOUS

## 2012-08-19 MED ORDER — ONDANSETRON 16 MG/50ML IVPB (CHCC)
16.0000 mg | Freq: Once | INTRAVENOUS | Status: AC
  Administered 2012-08-19: 16 mg via INTRAVENOUS

## 2012-08-19 MED ORDER — SODIUM CHLORIDE 0.9 % IV SOLN
Freq: Once | INTRAVENOUS | Status: AC
  Administered 2012-08-19: 10:00:00 via INTRAVENOUS

## 2012-08-19 MED ORDER — SODIUM CHLORIDE 0.9 % IV SOLN
1800.0000 mg/m2 | INTRAVENOUS | Status: DC
  Administered 2012-08-19: 3950 mg via INTRAVENOUS
  Filled 2012-08-19: qty 79

## 2012-08-19 MED ORDER — IRINOTECAN HCL CHEMO INJECTION 100 MG/5ML
137.0000 mg/m2 | Freq: Once | INTRAVENOUS | Status: AC
  Administered 2012-08-19: 300 mg via INTRAVENOUS
  Filled 2012-08-19: qty 15

## 2012-08-19 MED ORDER — FLUOROURACIL CHEMO INJECTION 2.5 GM/50ML
300.0000 mg/m2 | Freq: Once | INTRAVENOUS | Status: AC
  Administered 2012-08-19: 650 mg via INTRAVENOUS
  Filled 2012-08-19: qty 13

## 2012-08-19 MED ORDER — DEXAMETHASONE SODIUM PHOSPHATE 4 MG/ML IJ SOLN
20.0000 mg | Freq: Once | INTRAMUSCULAR | Status: AC
  Administered 2012-08-19: 20 mg via INTRAVENOUS

## 2012-08-19 MED ORDER — CETUXIMAB CHEMO IV INJECTION 200 MG/100ML
500.0000 mg/m2 | Freq: Once | INTRAVENOUS | Status: AC
  Administered 2012-08-19: 1100 mg via INTRAVENOUS
  Filled 2012-08-19: qty 550

## 2012-08-19 NOTE — Telephone Encounter (Signed)
Patient request that his appt for tomorrow be canceled and  Moved. I have called Darl Pikes downstairs appt moved and giving to patient.  JMW

## 2012-08-19 NOTE — Patient Instructions (Addendum)
Return for Pump DC 08/21/12 at    [facility] Discharge Instructions for Patients Receiving Chemotherapy  Today you received the following chemotherapy agents: erbitux, leucovorin, irinotecan, 5FU, 5FU pump.  To help prevent nausea and vomiting after your treatment, we encourage you to take your nausea medication.  Take it as often as prescribed.    If you develop nausea and vomiting that is not controlled by your nausea medication, call the clinic. If it is after clinic hours your family physician or the after hours number for the clinic or go to the Emergency Department.   BELOW ARE SYMPTOMS THAT SHOULD BE REPORTED IMMEDIATELY:  *FEVER GREATER THAN 100.5 F  *CHILLS WITH OR WITHOUT FEVER  NAUSEA AND VOMITING THAT IS NOT CONTROLLED WITH YOUR NAUSEA MEDICATION  *UNUSUAL SHORTNESS OF BREATH  *UNUSUAL BRUISING OR BLEEDING  TENDERNESS IN MOUTH AND THROAT WITH OR WITHOUT PRESENCE OF ULCERS  *URINARY PROBLEMS  *BOWEL PROBLEMS  UNUSUAL RASH Items with * indicate a potential emergency and should be followed up as soon as possible.  Feel free to call the clinic you have any questions or concerns. The clinic phone number is [phone].   I have been informed and understand all the instructions given to me. I know to contact the clinic, my physician, or go to the Emergency Department if any problems should occur. I do not have any questions at this time, but understand that I may call the clinic during office hours   should I have any questions or need assistance in obtaining follow up care.    __________________________________________  _____________  __________ Signature of Patient or Authorized Representative            Date                   Time    __________________________________________ Nurse's Signature

## 2012-08-19 NOTE — Telephone Encounter (Signed)
Per staff message and POF I have scheduled appts.  JMW  

## 2012-08-20 ENCOUNTER — Ambulatory Visit
Admission: RE | Admit: 2012-08-20 | Payer: BC Managed Care – PPO | Source: Ambulatory Visit | Admitting: Radiation Oncology

## 2012-08-20 HISTORY — DX: Personal history of irradiation: Z92.3

## 2012-08-21 ENCOUNTER — Encounter: Payer: Self-pay | Admitting: Oncology

## 2012-08-21 ENCOUNTER — Ambulatory Visit (HOSPITAL_BASED_OUTPATIENT_CLINIC_OR_DEPARTMENT_OTHER): Payer: BC Managed Care – PPO

## 2012-08-21 VITALS — BP 130/82 | HR 75 | Temp 98.8°F

## 2012-08-21 DIAGNOSIS — Z5189 Encounter for other specified aftercare: Secondary | ICD-10-CM

## 2012-08-21 DIAGNOSIS — C20 Malignant neoplasm of rectum: Secondary | ICD-10-CM

## 2012-08-21 MED ORDER — HEPARIN SOD (PORK) LOCK FLUSH 100 UNIT/ML IV SOLN
500.0000 [IU] | Freq: Once | INTRAVENOUS | Status: AC | PRN
  Administered 2012-08-21: 500 [IU]
  Filled 2012-08-21: qty 5

## 2012-08-21 MED ORDER — SODIUM CHLORIDE 0.9 % IJ SOLN
10.0000 mL | INTRAMUSCULAR | Status: DC | PRN
  Administered 2012-08-21: 10 mL
  Filled 2012-08-21: qty 10

## 2012-08-21 MED ORDER — PEGFILGRASTIM INJECTION 6 MG/0.6ML
6.0000 mg | Freq: Once | SUBCUTANEOUS | Status: AC
  Administered 2012-08-21: 6 mg via SUBCUTANEOUS
  Filled 2012-08-21: qty 0.6

## 2012-08-21 NOTE — Progress Notes (Signed)
Faxed January and February clinical information to [facility] @ [phone].

## 2012-08-24 ENCOUNTER — Telehealth: Payer: Self-pay | Admitting: Oncology

## 2012-08-24 NOTE — Telephone Encounter (Signed)
Talked to patient and gave him appt for 09/02/12 lab,ML and chemo

## 2012-08-27 ENCOUNTER — Encounter: Payer: Self-pay | Admitting: Radiation Oncology

## 2012-08-27 ENCOUNTER — Ambulatory Visit
Admission: RE | Admit: 2012-08-27 | Discharge: 2012-08-27 | Disposition: A | Discharge status: Home / Self Care | Payer: BC Managed Care – PPO | Source: Ambulatory Visit | Attending: Radiation Oncology | Admitting: Radiation Oncology

## 2012-08-27 VITALS — BP 151/94 | HR 92 | Temp 99.1°F | Resp 20 | Wt 200.6 lb

## 2012-08-27 DIAGNOSIS — C772 Secondary and unspecified malignant neoplasm of intra-abdominal lymph nodes: Secondary | ICD-10-CM

## 2012-08-27 DIAGNOSIS — Z923 Personal history of irradiation: Secondary | ICD-10-CM

## 2012-08-27 NOTE — Progress Notes (Signed)
Patient here follow up rectal ca s/p rad txs 03/23/12-04/21/12 60Gy/49fx Alert oriented x3, has one more chemotherapy infusion, last taken 08/19/12, has had 10 bowel movements today none yesterday, bladder problems stated as well occasionally, takes imodium prn, has no loose stool then has a "blow out then diarrhea", eats reg diet, avoids fresh foods, lettuce, takes meds when not having chemotherapy, pain 2 at present in lower abdomen and groin area b/l"Feels like a bug in your stomach and have diarrhea", .takes zofran and compazine continous queeziness, after taking chemo , gets very fatigued and lays on couch/bed for about 6 days befopre feeling better 10:58 AM

## 2012-08-27 NOTE — Progress Notes (Signed)
Radiation Oncology         [phone] ________________________________  Name: [name] MRN: [id number]  Date: 08/27/2012  DOB: 03-12-1966  Follow-Up Visit Note  CC: Delorse Lek, MD  Laurice Record, MD  Diagnosis:   47 year old gentleman with isolated paraaortic lymph node recurrence from rectal cancer s/p curative, salvage para-aortic radiotherapy for isolated oligo metastatic disease from 03/23/2012-04/21/2012 to 60 gray in 20 fractions of 3 gray  Interval Since Last Radiation:  4  months  Narrative:  The patient returns today for routine follow-up.  He had a CT of the chest abdomen and pelvis. This shows no new metastatic disease and continued resolution of his peri aorta lymph nodes.  He is continuing to receive systemic chemotherapy and has one more planned cycle. He does suffer with bowel irregularity including difficulty with holding stool for any length of time leading to bouts of rectal incontinence.                            ALLERGIES:  is allergic to penicillins.  Meds: Current Outpatient Prescriptions  Medication Sig Dispense Refill  . clindamycin (CLINDAGEL) 1 % gel Apply topically 2 (two) times daily. Apply to areas with rash.  60 g  1  . dicyclomine (BENTYL) 10 MG capsule Take 10 mg by mouth 3 (three) times daily.       Marland Kitchen doxycycline (VIBRA-TABS) 100 MG tablet Take 1 tablet (100 mg total) by mouth 2 (two) times daily.  60 tablet  2  . loperamide (IMODIUM) 2 MG capsule Take 2 mg by mouth 4 (four) times daily as needed. For diarrhea      . ondansetron (ZOFRAN) 8 MG tablet Take 8 mg by mouth every 12 (twelve) hours as needed. Take 8 mg po BID  X 3  Days after chemo , and as needed for nausea.      . ondansetron (ZOFRAN-ODT) 8 MG disintegrating tablet       . oxyCODONE (OXY IR/ROXICODONE) 5 MG immediate release tablet Take 1 tablet (5 mg total) by mouth every 4 (four) hours as needed. For pain  90 tablet  0  . prochlorperazine (COMPAZINE) 10 MG tablet Take 10 mg  by mouth every 6 (six) hours as needed. Nausea      . GLUCOSAMINE HCL PO Take 1 tablet by mouth daily.      . Pyridoxine HCl (VITAMIN B-6 PO) Take 1 tablet by mouth daily.      . rosuvastatin (CRESTOR) 5 MG tablet Take 5 mg by mouth at bedtime.       No current facility-administered medications for this encounter.   Facility-Administered Medications Ordered in Other Encounters  Medication Dose Route Frequency Provider Last Rate Last Dose  . fluorouracil (ADRUCIL) 3,950 mg in sodium chloride 0.9 % 150 mL chemo infusion  1,800 mg/m2 (Treatment Plan Actual) Intravenous 1 day or 1 dose Myrtis Ser, NP   3,950 mg at 08/05/12 1516  . sodium chloride 0.9 % injection 10 mL  10 mL Intracatheter PRN Benjiman Core, MD      . sodium chloride 0.9 % injection 10 mL  10 mL Intracatheter PRN Myrtis Ser, NP        Physical Findings: The patient is in no acute distress. Patient is alert and oriented.  weight is 200 lb 9.6 oz (90.992 kg). His oral temperature is 99.1 F (37.3 C). His blood pressure is 151/94 and his  pulse is 92. His respiration is 20. Marland Kitchen  No significant changes.  Lab Findings: Lab Results  Component Value Date   WBC 7.7 08/18/2012   HGB 14.2 08/18/2012   HCT 40.9 08/18/2012   MCV 85.5 08/18/2012   PLT 140 08/18/2012    Radiographic Findings: Ct Chest W Contrast  08/17/2012  *RADIOLOGY REPORT*  Clinical Data:  Colorectal cancer diagnosed 1/12 with recurrence. Partial colectomy with colostomy reversal.  Currently ongoing chemotherapy.  Prior radiation therapy.  CT CHEST, ABDOMEN AND PELVIS WITH CONTRAST  Technique: Contiguous axial images of the chest abdomen and pelvis were obtained after IV contrast administration.  Contrast: 100  ml Omnipaque-300  Comparison: P E T of 05/29/2012 and 02/13/2012.  Most recent diagnostic CTs of 02/15/2012.  CT CHEST  Findings: Lung windows demonstrate 4 mm right middle lobe lung nodule on image 34/series 4.  Appears slightly more conspicuous today,  favored to be due to differences in slice selection. More inferolateral right middle lobe 4 mm nodule on image 37/series 4 is unchanged.  2 mm left upper lobe nodule on image 19/series 4 is unchanged.  No new nodules.  Soft tissue windows demonstrate no supraclavicular adenopathy.  A right-sided Port-A-Cath which terminates at the low SVC.  Normal heart size without pericardial or pleural effusion.  No central pulmonary embolism, on this non-dedicated study.  No mediastinal or hilar adenopathy.  IMPRESSION:  1. No acute process or evidence of metastatic disease in the chest. 2.  Similar bilateral small pulmonary nodules.  CT ABDOMEN AND PELVIS  Findings:  Moderate hepatic steatosis, without focal hepatic lesion.  A splenule.  Normal stomach, pancreas. Cholecystectomy without biliary ductal dilatation.  Normal adrenal glands and kidneys. The right renal calculus described on 02/07/2012 is now positioned within the distal right ureter on image 114/series 2. This measures approximately 4 mm.  Mildly age advanced aortic atherosclerosis. No retroperitoneal or retrocrural adenopathy.  A precaval node measures 6 mm on image 84/series 2 versus 7 mm on 05/29/2012.  Immediately cephalad precaval node measures 5 mm on image 81/series 2 versus 7 mm on 05/29/2012.  Normal colon, appendix, and terminal ileum.  Enterotomy on image 108/series 2.  Small bowel otherwise normal, without ascites. No evidence of omental or peritoneal disease.  No pelvic sidewall adenopathy. Normal urinary bladder and prostate. No significant free fluid.  Similar presacral fascial thickening which is likely postoperative.  No well-defined mass to suggest recurrent disease.  No acute osseous abnormality.  Disk bulges at L3-L4 and L4-L5.  IMPRESSION:  1.  Further decreased size of retroperitoneal nodes since 05/29/2012 PET. 2.  No new or progressive disease. 3.  Hepatic steatosis. 4.  Right-sided urinary tract calculus now positioned within the distal  right ureter.  No significant proximal obstruction.   Original Report Authenticated By: Jeronimo Greaves, M.D.    Ct Abdomen Pelvis W Contrast  08/17/2012  *RADIOLOGY REPORT*  Clinical Data:  Colorectal cancer diagnosed 1/12 with recurrence. Partial colectomy with colostomy reversal.  Currently ongoing chemotherapy.  Prior radiation therapy.  CT CHEST, ABDOMEN AND PELVIS WITH CONTRAST  Technique: Contiguous axial images of the chest abdomen and pelvis were obtained after IV contrast administration.  Contrast: 100  ml Omnipaque-300  Comparison: P E T of 05/29/2012 and 02/13/2012.  Most recent diagnostic CTs of 02/15/2012.  CT CHEST  Findings: Lung windows demonstrate 4 mm right middle lobe lung nodule on image 34/series 4.  Appears slightly more conspicuous today, favored to be due to differences in slice  selection. More inferolateral right middle lobe 4 mm nodule on image 37/series 4 is unchanged.  2 mm left upper lobe nodule on image 19/series 4 is unchanged.  No new nodules.  Soft tissue windows demonstrate no supraclavicular adenopathy.  A right-sided Port-A-Cath which terminates at the low SVC.  Normal heart size without pericardial or pleural effusion.  No central pulmonary embolism, on this non-dedicated study.  No mediastinal or hilar adenopathy.  IMPRESSION:  1. No acute process or evidence of metastatic disease in the chest. 2.  Similar bilateral small pulmonary nodules.  CT ABDOMEN AND PELVIS  Findings:  Moderate hepatic steatosis, without focal hepatic lesion.  A splenule.  Normal stomach, pancreas. Cholecystectomy without biliary ductal dilatation.  Normal adrenal glands and kidneys. The right renal calculus described on 02/07/2012 is now positioned within the distal right ureter on image 114/series 2. This measures approximately 4 mm.  Mildly age advanced aortic atherosclerosis. No retroperitoneal or retrocrural adenopathy.  A precaval node measures 6 mm on image 84/series 2 versus 7 mm on 05/29/2012.   Immediately cephalad precaval node measures 5 mm on image 81/series 2 versus 7 mm on 05/29/2012.  Normal colon, appendix, and terminal ileum.  Enterotomy on image 108/series 2.  Small bowel otherwise normal, without ascites. No evidence of omental or peritoneal disease.  No pelvic sidewall adenopathy. Normal urinary bladder and prostate. No significant free fluid.  Similar presacral fascial thickening which is likely postoperative.  No well-defined mass to suggest recurrent disease.  No acute osseous abnormality.  Disk bulges at L3-L4 and L4-L5.  IMPRESSION:  1.  Further decreased size of retroperitoneal nodes since 05/29/2012 PET. 2.  No new or progressive disease. 3.  Hepatic steatosis. 4.  Right-sided urinary tract calculus now positioned within the distal right ureter.  No significant proximal obstruction.   Original Report Authenticated By: Jeronimo Greaves, M.D.    Impression:  The patient is recovering from the effects of radiation.  He is continuing to receive chemotherapy. His current rectal symptoms may be multifactorial and related to his previous pelvic radiation, current chemotherapy, or previous surgical interventions. Regardless, patient has undergone rectal manometry evaluation without rectal manometry biofeedback training area and if his bowel incontinence issues do not improve following completion of chemotherapy, perhaps he should be referred back to the [facility] gastroenterology division to discuss biofeedback training.  Plan:  The patient will return to our clinic for routine followup in 3 months or sooner if necessary. If he desires to pursue biofeedback training with rectal manometry, we will contact you and see biofeedback provider Nicole Kindred) directly, call [phone].   _____________________________________  Artist Pais. Kathrynn Running, M.D.

## 2012-09-02 ENCOUNTER — Other Ambulatory Visit: Payer: BC Managed Care – PPO | Admitting: Lab

## 2012-09-02 ENCOUNTER — Other Ambulatory Visit (HOSPITAL_BASED_OUTPATIENT_CLINIC_OR_DEPARTMENT_OTHER): Payer: BC Managed Care – PPO

## 2012-09-02 ENCOUNTER — Ambulatory Visit (HOSPITAL_BASED_OUTPATIENT_CLINIC_OR_DEPARTMENT_OTHER): Payer: BC Managed Care – PPO

## 2012-09-02 ENCOUNTER — Encounter: Payer: Self-pay | Admitting: Oncology

## 2012-09-02 ENCOUNTER — Ambulatory Visit: Payer: BC Managed Care – PPO | Admitting: Oncology

## 2012-09-02 ENCOUNTER — Telehealth: Payer: Self-pay | Admitting: Oncology

## 2012-09-02 ENCOUNTER — Ambulatory Visit (HOSPITAL_BASED_OUTPATIENT_CLINIC_OR_DEPARTMENT_OTHER): Payer: BC Managed Care – PPO | Admitting: Oncology

## 2012-09-02 VITALS — BP 137/78 | HR 83 | Temp 97.7°F | Resp 20 | Ht 71.0 in | Wt 203.9 lb

## 2012-09-02 DIAGNOSIS — C20 Malignant neoplasm of rectum: Secondary | ICD-10-CM

## 2012-09-02 DIAGNOSIS — C19 Malignant neoplasm of rectosigmoid junction: Secondary | ICD-10-CM

## 2012-09-02 DIAGNOSIS — L270 Generalized skin eruption due to drugs and medicaments taken internally: Secondary | ICD-10-CM

## 2012-09-02 DIAGNOSIS — Z5111 Encounter for antineoplastic chemotherapy: Secondary | ICD-10-CM

## 2012-09-02 LAB — COMPREHENSIVE METABOLIC PANEL (CC13)
CO2: 24 mEq/L (ref 22–29)
Creatinine: 0.8 mg/dL (ref 0.7–1.3)
Glucose: 137 mg/dl — ABNORMAL HIGH (ref 70–99)
Total Bilirubin: 0.48 mg/dL (ref 0.20–1.20)

## 2012-09-02 LAB — CBC WITH DIFFERENTIAL/PLATELET
Basophils Absolute: 0.1 10*3/uL (ref 0.0–0.1)
Eosinophils Absolute: 0.2 10*3/uL (ref 0.0–0.5)
HGB: 15.2 g/dL (ref 13.0–17.1)
LYMPH%: 11.2 % — ABNORMAL LOW (ref 14.0–49.0)
MCH: 29.6 pg (ref 27.2–33.4)
MCV: 85.4 fL (ref 79.3–98.0)
MONO%: 8 % (ref 0.0–14.0)
NEUT#: 6 10*3/uL (ref 1.5–6.5)
Platelets: 125 10*3/uL — ABNORMAL LOW (ref 140–400)
RBC: 5.13 10*6/uL (ref 4.20–5.82)

## 2012-09-02 LAB — CEA: CEA: 2.7 ng/mL (ref 0.0–5.0)

## 2012-09-02 LAB — MAGNESIUM (CC13): Magnesium: 2.2 mg/dl (ref 1.5–2.5)

## 2012-09-02 MED ORDER — LEUCOVORIN CALCIUM INJECTION 350 MG
301.0000 mg/m2 | Freq: Once | INTRAVENOUS | Status: AC
  Administered 2012-09-02: 660 mg via INTRAVENOUS
  Filled 2012-09-02: qty 33

## 2012-09-02 MED ORDER — DIPHENHYDRAMINE HCL 50 MG/ML IJ SOLN
50.0000 mg | Freq: Once | INTRAMUSCULAR | Status: AC
  Administered 2012-09-02: 50 mg via INTRAVENOUS

## 2012-09-02 MED ORDER — SODIUM CHLORIDE 0.9 % IV SOLN
Freq: Once | INTRAVENOUS | Status: AC
  Administered 2012-09-02: 11:00:00 via INTRAVENOUS

## 2012-09-02 MED ORDER — CETUXIMAB CHEMO IV INJECTION 200 MG/100ML
500.0000 mg/m2 | Freq: Once | INTRAVENOUS | Status: AC
  Administered 2012-09-02: 1100 mg via INTRAVENOUS
  Filled 2012-09-02: qty 550

## 2012-09-02 MED ORDER — DEXAMETHASONE SODIUM PHOSPHATE 4 MG/ML IJ SOLN
20.0000 mg | Freq: Once | INTRAMUSCULAR | Status: AC
  Administered 2012-09-02: 20 mg via INTRAVENOUS

## 2012-09-02 MED ORDER — FLUOROURACIL CHEMO INJECTION 2.5 GM/50ML
300.0000 mg/m2 | Freq: Once | INTRAVENOUS | Status: AC
  Administered 2012-09-02: 650 mg via INTRAVENOUS
  Filled 2012-09-02: qty 13

## 2012-09-02 MED ORDER — ATROPINE SULFATE 1 MG/ML IJ SOLN
1.0000 mg | Freq: Once | INTRAMUSCULAR | Status: AC | PRN
  Administered 2012-09-02: 1 mg via INTRAVENOUS

## 2012-09-02 MED ORDER — SODIUM CHLORIDE 0.9 % IV SOLN
1800.0000 mg/m2 | INTRAVENOUS | Status: DC
  Administered 2012-09-02: 3950 mg via INTRAVENOUS
  Filled 2012-09-02: qty 79

## 2012-09-02 MED ORDER — ONDANSETRON 16 MG/50ML IVPB (CHCC)
16.0000 mg | Freq: Once | INTRAVENOUS | Status: AC
Start: 2012-09-02 — End: 2012-09-02
  Administered 2012-09-02: 16 mg via INTRAVENOUS

## 2012-09-02 MED ORDER — IRINOTECAN HCL CHEMO INJECTION 100 MG/5ML
137.0000 mg/m2 | Freq: Once | INTRAVENOUS | Status: AC
  Administered 2012-09-02: 300 mg via INTRAVENOUS
  Filled 2012-09-02: qty 15

## 2012-09-02 NOTE — Progress Notes (Signed)
Hematology and Oncology Follow Up Visit  [name] [id number] [birth year] 47 y.o. 09/02/2012 10:29 AM Steele,Eric A, MDBurnett, Eric Bern, MD   Principle Diagnosis: The patient is a 47 year old man with recurrent KRAS wild-type  rectal cancer  Prior Therapy:  1. He was diagnosed with node-positive rectal cancer, for which he underwent proctectomy with colon anastomosis and ileostomy on August 09, 2010, at [facility] for a T3 N1 (1 of 18 nodes), moderately differentiated KRAS wild-type adenocarcinoma of the rectum.  2. He went on to receive 6 cycles of oxaliplatin-based therapy with 2 cycles FOLFOX and 4 cycles of XELOX.  3. He then completed external radiation therapy with continuous infusion 5-FU from April 16, 2011, through May 21, 2011.  4. He then underwent surveillance and was found to have recurrent tumor located to an aortocaval lymph node with an elevated CEA of 53 in September 2013. He began localized radiation therapy with CyberKnife concomitantly with Xeloda 1000 mg p.o. b.i.d. on 03/23/2012, completing therapy on 04/21/2012. His CEA level decreased. PET scan did show activity at the aortocaval node chain with persistent activity at the rectal anastomosis.   Current therapy: He was started on FOLFIRI with Erbitux on 06/17/12. He received 1 cycle which was complicated by diarrhea. Chemo has been on hold.His chemo was restarted at 25% dose reduction on 12/31. He is here for cycle 6.   Interim History:  [name] presents to the office today for routine follow-up. Chemotherapy has been well tolerated at reduced doses. Diarrhea has now resolved. He developed a rash due to Erbitux that has significantly improved at this time. He used Clindamycin gel to his face with improvement. Not routinely using Hydrocortisone or moisturizer at this time. Appetite has improved and he is gaining back the weight that he lost. Denies chest pain, shortness of breath, abdominal pain, nausea,  vomiting. He has not noticed any bleeding. He does report changes in his bowel habits alternating between constipation and diarrhea.   Medications: I have reviewed the patient's current medications. Current outpatient prescriptions:clindamycin (CLINDAGEL) 1 % gel, Apply topically 2 (two) times daily. Apply to areas with rash., Disp: 60 g, Rfl: 1;  dicyclomine (BENTYL) 10 MG capsule, Take 10 mg by mouth 3 (three) times daily. , Disp: , Rfl: ;  doxycycline (VIBRA-TABS) 100 MG tablet, Take 1 tablet (100 mg total) by mouth 2 (two) times daily., Disp: 60 tablet, Rfl: 2;  GLUCOSAMINE HCL PO, Take 1 tablet by mouth daily., Disp: , Rfl:  loperamide (IMODIUM) 2 MG capsule, Take 2 mg by mouth 4 (four) times daily as needed. For diarrhea, Disp: , Rfl: ;  ondansetron (ZOFRAN) 8 MG tablet, Take 8 mg by mouth every 12 (twelve) hours as needed. Take 8 mg po BID  X 3  Days after chemo , and as needed for nausea., Disp: , Rfl: ;  ondansetron (ZOFRAN-ODT) 8 MG disintegrating tablet, , Disp: , Rfl:  oxyCODONE (OXY IR/ROXICODONE) 5 MG immediate release tablet, Take 1 tablet (5 mg total) by mouth every 4 (four) hours as needed. For pain, Disp: 90 tablet, Rfl: 0;  prochlorperazine (COMPAZINE) 10 MG tablet, Take 10 mg by mouth every 6 (six) hours as needed. Nausea, Disp: , Rfl: ;  Pyridoxine HCl (VITAMIN B-6 PO), Take 1 tablet by mouth daily., Disp: , Rfl: ;  rosuvastatin (CRESTOR) 5 MG tablet, Take 5 mg by mouth at bedtime., Disp: , Rfl:  No current facility-administered medications for this visit. Facility-Administered Medications Ordered in Other Visits:  fluorouracil (ADRUCIL) 3,950 mg in sodium chloride 0.9 % 150 mL chemo infusion, 1,800 mg/m2 (Treatment Plan Actual), Intravenous, 1 day or 1 dose, Myrtis Ser, NP, 3,950 mg at 08/05/12 1516;  sodium chloride 0.9 % injection 10 mL, 10 mL, Intracatheter, PRN, Benjiman Core, MD;  sodium chloride 0.9 % injection 10 mL, 10 mL, Intracatheter, PRN, Myrtis Ser,  NP  Allergies:  Allergies  Allergen Reactions  . Penicillins Itching    Intermittent reaction per pt    Past Medical History, Surgical history, Social history, and Family History were reviewed and updated.  Review of Systems: Constitutional:  Negative for fever, chills, night sweats, anorexia, weight loss, pain. Cardiovascular: no chest pain or dyspnea on exertion Respiratory: no cough, shortness of breath, or wheezing Neurological: no TIA or stroke symptoms Dermatological: negative ENT: negative Skin: Negative. Gastrointestinal: no abdominal pain, change in bowel habits, or black or bloody stools Genito-Urinary: no dysuria, trouble voiding, or hematuria Hematological and Lymphatic: negative Breast: negative for breast lumps Musculoskeletal: negative Remaining ROS negative.  Physical Exam: Blood pressure 137/78, pulse 83, temperature 97.7 F (36.5 C), temperature source Oral, resp. rate 20, height 5\' 11"  (1.803 m), weight 203 lb 14.4 oz (92.488 kg). ECOG: 0 General appearance: alert, cooperative and no distress Head: Normocephalic, without obvious abnormality, atraumatic Neck: no adenopathy, no carotid bruit, no JVD, supple, symmetrical, trachea midline and thyroid not enlarged, symmetric, no tenderness/mass/nodules Lymph nodes: Cervical, supraclavicular, and axillary nodes normal. Heart:regular rate and rhythm, S1, S2 normal, no murmur, click, rub or gallop Lung:chest clear, no wheezing, rales, normal symmetric air entry Abdomen: soft, non-tender, without masses or organomegaly EXT:no erythema, induration, or nodules   Lab Results: Lab Results  Component Value Date   WBC 7.7 09/02/2012   HGB 15.2 09/02/2012   HCT 43.8 09/02/2012   MCV 85.4 09/02/2012   PLT 125* 09/02/2012     Chemistry      Component Value Date/Time   NA 142 08/18/2012 1557   NA 138 06/25/2012 0530   NA 142 02/07/2012 0734   K 3.5 08/18/2012 1557   K 3.4* 06/25/2012 0530   K 4.5 02/07/2012 0734   CL  106 08/18/2012 1557   CL 106 06/25/2012 0530   CL 104 02/07/2012 0734   CO2 28 08/18/2012 1557   CO2 26 06/25/2012 0530   CO2 26 02/07/2012 0734   BUN 4.6* 08/18/2012 1557   BUN 4* 06/25/2012 0530   BUN 13 02/07/2012 0734   CREATININE 0.8 08/18/2012 1557   CREATININE 0.77 06/25/2012 0530   CREATININE 1.0 02/07/2012 0734      Component Value Date/Time   CALCIUM 9.1 08/18/2012 1557   CALCIUM 8.1* 06/25/2012 0530   CALCIUM 9.3 02/07/2012 0734   ALKPHOS 115 08/18/2012 1557   ALKPHOS 59 06/24/2012 0640   ALKPHOS 53 02/07/2012 0734   AST 23 08/18/2012 1557   AST 13 06/24/2012 0640   AST 31 02/07/2012 0734   ALT 41 08/18/2012 1557   ALT 19 06/24/2012 0640   BILITOT 0.53 08/18/2012 1557   BILITOT 0.5 06/24/2012 0640   BILITOT 0.80 02/07/2012 0734     Impression and Plan: This is a 47 year old gentleman with the following issues:  1. Recurrent KRAS wild-type rectal cancer. Currently receiving systemic chemotherapy with FOLFIRI/Erbitux. He developed grade 3 diarrhea and grade 2-3 rash with cycle 1. Did better with subsequent cycles with dose reduction.  CT scans after 4 cycles showed a good response. Plan is to proceed  with cycle 6 today without dose modification. CT scan to be done in about 6 weeks. I would not treat him after cycle 6 unless he relapses.    2. Neutropenia prophylaxis. On Neulasta with each cycle of chemo.  3. Skin rash. Due to Erbitux. Continue Clindamycin gel to face. On  Doxycycline 100 mg BID as well.  I have given him instructions regarding the use of Hydrocortisone, moisturizer, and sunscreen.  4. Diarrhea. Due to chemotherapy. Resolved.   5. Nausea/vomiting prophylaxis. He has Zofran and Compazine at home.  6. Hyperlipidemia. On Crestor per PCP.  7. Follow-up. In about 6 weeks after CT scans.      [place], [place] 2/26/201410:29 AM

## 2012-09-02 NOTE — Patient Instructions (Addendum)
[facility] Discharge Instructions for Patients Receiving Chemotherapy  Today you received the following chemotherapy agents Erbitux, Camptosar, Leucovorin, 5 FU To help prevent nausea and vomiting after your treatment, we encourage you to take your nausea medication as prescribed Begin taking it as directed and take it as often as prescribed for the next 48-72 hours after pump d/c.   If you develop nausea and vomiting that is not controlled by your nausea medication, call the clinic. If it is after clinic hours your family physician or the after hours number for the clinic or go to the Emergency Department.   BELOW ARE SYMPTOMS THAT SHOULD BE REPORTED IMMEDIATELY:  *FEVER GREATER THAN 100.5 F  *CHILLS WITH OR WITHOUT FEVER  NAUSEA AND VOMITING THAT IS NOT CONTROLLED WITH YOUR NAUSEA MEDICATION  *UNUSUAL SHORTNESS OF BREATH  *UNUSUAL BRUISING OR BLEEDING  TENDERNESS IN MOUTH AND THROAT WITH OR WITHOUT PRESENCE OF ULCERS  *URINARY PROBLEMS  *BOWEL PROBLEMS  UNUSUAL RASH Items with * indicate a potential emergency and should be followed up as soon as possible.  One of the nurses will contact you 24 hours after your treatment. Please let the nurse know about any problems that you may have experienced. Feel free to call the clinic you have any questions or concerns. The clinic phone number is [phone].   I have been informed and understand all the instructions given to me. I know to contact the clinic, my physician, or go to the Emergency Department if any problems should occur. I do not have any questions at this time, but understand that I may call the clinic during office hours   should I have any questions or need assistance in obtaining follow up care.    __________________________________________  _____________  __________ Signature of Patient or Authorized Representative            Date                    Time    __________________________________________ Nurse's Signature

## 2012-09-04 ENCOUNTER — Ambulatory Visit (HOSPITAL_BASED_OUTPATIENT_CLINIC_OR_DEPARTMENT_OTHER): Payer: BC Managed Care – PPO

## 2012-09-04 ENCOUNTER — Ambulatory Visit: Payer: BC Managed Care – PPO

## 2012-09-04 VITALS — BP 134/77 | HR 74

## 2012-09-04 DIAGNOSIS — C20 Malignant neoplasm of rectum: Secondary | ICD-10-CM

## 2012-09-04 DIAGNOSIS — Z5189 Encounter for other specified aftercare: Secondary | ICD-10-CM

## 2012-09-04 MED ORDER — HEPARIN SOD (PORK) LOCK FLUSH 100 UNIT/ML IV SOLN
500.0000 [IU] | Freq: Once | INTRAVENOUS | Status: AC | PRN
  Administered 2012-09-04: 500 [IU]
  Filled 2012-09-04: qty 5

## 2012-09-04 MED ORDER — PEGFILGRASTIM INJECTION 6 MG/0.6ML
6.0000 mg | Freq: Once | SUBCUTANEOUS | Status: AC
  Administered 2012-09-04: 6 mg via SUBCUTANEOUS
  Filled 2012-09-04: qty 0.6

## 2012-09-04 MED ORDER — SODIUM CHLORIDE 0.9 % IJ SOLN
10.0000 mL | INTRAMUSCULAR | Status: DC | PRN
  Administered 2012-09-04: 10 mL
  Filled 2012-09-04: qty 10

## 2012-09-04 NOTE — Patient Instructions (Signed)
Call MD for problems or concerns 

## 2012-09-16 ENCOUNTER — Encounter: Payer: Self-pay | Admitting: Oncology

## 2012-09-16 NOTE — Progress Notes (Signed)
Put [employer] disability form on nurse's desk. °

## 2012-09-16 NOTE — Progress Notes (Signed)
Faxed disability form to [facility], [place] @ [phone].

## 2012-10-08 ENCOUNTER — Telehealth: Payer: Self-pay | Admitting: *Deleted

## 2012-10-08 NOTE — Telephone Encounter (Signed)
CALLED PATIENT TO INFORM OF FU APPT. BEING CHANGED TO MAY 29 AT 10:00 AM, DUE TO DR. MANNING BEING IN THE OR ON 11-26-12 AT 9:15 AM, NO ANSWER, MAILED APPT. CARD.

## 2012-10-14 ENCOUNTER — Ambulatory Visit (HOSPITAL_COMMUNITY): Payer: BC Managed Care – PPO

## 2012-10-14 ENCOUNTER — Encounter (HOSPITAL_COMMUNITY): Payer: Self-pay

## 2012-10-14 ENCOUNTER — Other Ambulatory Visit: Payer: BC Managed Care – PPO | Admitting: Lab

## 2012-10-14 ENCOUNTER — Ambulatory Visit (HOSPITAL_COMMUNITY)
Admission: RE | Admit: 2012-10-14 | Discharge: 2012-10-14 | Disposition: A | Discharge status: Home / Self Care | Payer: BC Managed Care – PPO | Source: Ambulatory Visit | Attending: Oncology | Admitting: Oncology

## 2012-10-14 DIAGNOSIS — C779 Secondary and unspecified malignant neoplasm of lymph node, unspecified: Secondary | ICD-10-CM | POA: Insufficient documentation

## 2012-10-14 DIAGNOSIS — Z9221 Personal history of antineoplastic chemotherapy: Secondary | ICD-10-CM | POA: Insufficient documentation

## 2012-10-14 DIAGNOSIS — K7689 Other specified diseases of liver: Secondary | ICD-10-CM | POA: Insufficient documentation

## 2012-10-14 DIAGNOSIS — J984 Other disorders of lung: Secondary | ICD-10-CM | POA: Insufficient documentation

## 2012-10-14 DIAGNOSIS — Z87442 Personal history of urinary calculi: Secondary | ICD-10-CM | POA: Insufficient documentation

## 2012-10-14 DIAGNOSIS — Z9089 Acquired absence of other organs: Secondary | ICD-10-CM | POA: Insufficient documentation

## 2012-10-14 DIAGNOSIS — C20 Malignant neoplasm of rectum: Secondary | ICD-10-CM

## 2012-10-14 DIAGNOSIS — R918 Other nonspecific abnormal finding of lung field: Secondary | ICD-10-CM | POA: Insufficient documentation

## 2012-10-14 MED ORDER — IOHEXOL 300 MG/ML  SOLN
100.0000 mL | Freq: Once | INTRAMUSCULAR | Status: AC | PRN
  Administered 2012-10-14: 100 mL via INTRAVENOUS

## 2012-10-15 ENCOUNTER — Ambulatory Visit: Payer: BC Managed Care – PPO | Admitting: Oncology

## 2012-10-15 ENCOUNTER — Other Ambulatory Visit: Payer: BC Managed Care – PPO

## 2012-10-16 ENCOUNTER — Telehealth: Payer: Self-pay | Admitting: Oncology

## 2012-10-21 ENCOUNTER — Encounter: Payer: Self-pay | Admitting: Oncology

## 2012-10-21 NOTE — Progress Notes (Signed)
Recd notice from [facility] of dos 02/13/13-- CT Scans are covered. Will forward to medical recrds.

## 2012-11-03 ENCOUNTER — Encounter: Payer: Self-pay | Admitting: Oncology

## 2012-11-03 NOTE — Progress Notes (Signed)
Faxed clinical information to [employer] @ [phone] °

## 2012-11-20 ENCOUNTER — Ambulatory Visit (HOSPITAL_BASED_OUTPATIENT_CLINIC_OR_DEPARTMENT_OTHER): Payer: BC Managed Care – PPO | Admitting: Oncology

## 2012-11-20 ENCOUNTER — Telehealth: Payer: Self-pay | Admitting: Oncology

## 2012-11-20 ENCOUNTER — Other Ambulatory Visit (HOSPITAL_BASED_OUTPATIENT_CLINIC_OR_DEPARTMENT_OTHER): Payer: BC Managed Care – PPO | Admitting: Lab

## 2012-11-20 VITALS — BP 155/95 | HR 70 | Temp 98.4°F | Resp 18 | Ht 71.0 in | Wt 212.9 lb

## 2012-11-20 DIAGNOSIS — C20 Malignant neoplasm of rectum: Secondary | ICD-10-CM

## 2012-11-20 DIAGNOSIS — E785 Hyperlipidemia, unspecified: Secondary | ICD-10-CM

## 2012-11-20 LAB — CBC WITH DIFFERENTIAL/PLATELET
Basophils Absolute: 0 10*3/uL (ref 0.0–0.1)
EOS%: 3.3 % (ref 0.0–7.0)
HCT: 41.6 % (ref 38.4–49.9)
HGB: 14.1 g/dL (ref 13.0–17.1)
LYMPH%: 25.7 % (ref 14.0–49.0)
MCH: 27.9 pg (ref 27.2–33.4)
MCV: 82.4 fL (ref 79.3–98.0)
MONO%: 12.9 % (ref 0.0–14.0)
NEUT%: 57.3 % (ref 39.0–75.0)
Platelets: 151 10*3/uL (ref 140–400)

## 2012-11-20 LAB — COMPREHENSIVE METABOLIC PANEL (CC13)
AST: 20 U/L (ref 5–34)
Alkaline Phosphatase: 75 U/L (ref 40–150)
BUN: 13 mg/dL (ref 7.0–26.0)
Creatinine: 0.9 mg/dL (ref 0.7–1.3)
Glucose: 103 mg/dl — ABNORMAL HIGH (ref 70–99)

## 2012-11-20 MED ORDER — HEPARIN SOD (PORK) LOCK FLUSH 100 UNIT/ML IV SOLN
500.0000 [IU] | Freq: Once | INTRAVENOUS | Status: AC
  Administered 2012-11-20: 500 [IU] via INTRAVENOUS
  Filled 2012-11-20: qty 5

## 2012-11-20 MED ORDER — SODIUM CHLORIDE 0.9 % IJ SOLN
10.0000 mL | INTRAMUSCULAR | Status: DC | PRN
  Administered 2012-11-20: 10 mL via INTRAVENOUS
  Filled 2012-11-20: qty 10

## 2012-11-20 NOTE — Telephone Encounter (Signed)
gv and printed appt sched and avs for pt...gv pt Barium

## 2012-11-20 NOTE — Progress Notes (Signed)
Hematology and Oncology Follow Up Visit  [name] [id number] March 27, 1966 47 y.o. 11/20/2012 10:16 AM [name],[name], MDBurnett, [name], MD   Principle Diagnosis: The patient is a 47 year old man with recurrent KRAS wild-type  rectal cancer  Prior Therapy:  1. He was diagnosed with node-positive rectal cancer, for which he underwent proctectomy with colon anastomosis and ileostomy on August 09, 2010, at [facility] for a T3 N1 (1 of 18 nodes), moderately differentiated KRAS wild-type adenocarcinoma of the rectum.  2. He went on to receive 6 cycles of oxaliplatin-based therapy with 2 cycles FOLFOX and 4 cycles of XELOX.  3. He then completed external radiation therapy with continuous infusion 5-FU from April 16, 2011, through May 21, 2011.  4. He then underwent surveillance and was found to have recurrent tumor located to an aortocaval lymph node with an elevated CEA of 53 in September 2013. He began localized radiation therapy with CyberKnife concomitantly with Xeloda 1000 mg p.o. b.i.d. on 03/23/2012, completing therapy on 04/21/2012. His CEA level decreased. PET scan did show activity at the aortocaval node chain with persistent activity at the rectal anastomosis.  5. He was started on FOLFIRI with Erbitux on 06/17/12. He received 1 cycle which was complicated by diarrhea. Chemo has been on hold.His chemo was restarted at 25% dose reduction on 12/31. He is S/P 6 cycles completed in 09/2012.   Current therapy: Observation and follow up.   Interim History:  [name] presents to the office today for routine follow-up. Chemotherapy has been well tolerated at reduced doses and since stopping he has done well. Diarrhea has now resolved. He developed a rash due to Erbitux that has significantly improved at this time. He used Clindamycin gel to his face with improvement. Not routinely using Hydrocortisone or moisturizer at this time. Appetite has improved and he is gaining back the weight  that he lost. Denies chest pain, shortness of breath, abdominal pain, nausea, vomiting. He has not noticed any bleeding. He does report changes in his bowel habits alternating between constipation and diarrhea.   Medications: I have reviewed the patient's current medications. Current outpatient prescriptions:clindamycin (CLINDAGEL) 1 % gel, Apply topically 2 (two) times daily. Apply to areas with rash., Disp: 60 g, Rfl: 1;  dicyclomine (BENTYL) 10 MG capsule, Take 10 mg by mouth 3 (three) times daily. , Disp: , Rfl: ;  GLUCOSAMINE HCL PO, Take 1 tablet by mouth daily., Disp: , Rfl: ;  loperamide (IMODIUM) 2 MG capsule, Take 2 mg by mouth 4 (four) times daily as needed. For diarrhea, Disp: , Rfl:  Nutritional Supplements (JUICE PLUS FIBRE PO), Take 1 Container by mouth 6 (six) times daily., Disp: , Rfl: ;  ondansetron (ZOFRAN) 8 MG tablet, Take 8 mg by mouth every 12 (twelve) hours as needed. Take 8 mg po BID  X 3  Days after chemo , and as needed for nausea., Disp: , Rfl: ;  ondansetron (ZOFRAN-ODT) 8 MG disintegrating tablet, , Disp: , Rfl:  oxyCODONE (OXY IR/ROXICODONE) 5 MG immediate release tablet, Take 1 tablet (5 mg total) by mouth every 4 (four) hours as needed. For pain, Disp: 90 tablet, Rfl: 0;  prochlorperazine (COMPAZINE) 10 MG tablet, Take 10 mg by mouth every 6 (six) hours as needed. Nausea, Disp: , Rfl: ;  Pyridoxine HCl (VITAMIN B-6 PO), Take 1 tablet by mouth daily., Disp: , Rfl: ;  rosuvastatin (CRESTOR) 5 MG tablet, Take 5 mg by mouth at bedtime., Disp: , Rfl:  Current  facility-administered medications:sodium chloride 0.9 % injection 10 mL, 10 mL, Intravenous, PRN, Benjiman Core, MD, 10 mL at 11/20/12 2841 Facility-Administered Medications Ordered in Other Visits: fluorouracil (ADRUCIL) 3,950 mg in sodium chloride 0.9 % 150 mL chemo infusion, 1,800 mg/m2 (Treatment Plan Actual), Intravenous, 1 day or 1 dose, Myrtis Ser, NP, 3,950 mg at 08/05/12 1516;  sodium chloride 0.9 % injection 10  mL, 10 mL, Intracatheter, PRN, Benjiman Core, MD;  sodium chloride 0.9 % injection 10 mL, 10 mL, Intracatheter, PRN, Myrtis Ser, NP  Allergies:  Allergies  Allergen Reactions  . Penicillins Itching    Intermittent reaction per pt    Past Medical History, Surgical history, Social history, and Family History were reviewed and updated.  Review of Systems: Constitutional:  Negative for fever, chills, night sweats, anorexia, weight loss, pain. Cardiovascular: no chest pain or dyspnea on exertion Respiratory: no cough, shortness of breath, or wheezing Neurological: no TIA or stroke symptoms Dermatological: negative ENT: negative Skin: Negative. Gastrointestinal: no abdominal pain, change in bowel habits, or black or bloody stools Genito-Urinary: no dysuria, trouble voiding, or hematuria Hematological and Lymphatic: negative Breast: negative for breast lumps Musculoskeletal: negative Remaining ROS negative.  Physical Exam: Blood pressure 155/95, pulse 70, temperature 98.4 F (36.9 C), temperature source Oral, resp. rate 18, height 5\' 11"  (1.803 m), weight 212 lb 14.4 oz (96.571 kg). ECOG: 0 General appearance: alert, cooperative and no distress Head: Normocephalic, without obvious abnormality, atraumatic Neck: no adenopathy, no carotid bruit, no JVD, supple, symmetrical, trachea midline and thyroid not enlarged, symmetric, no tenderness/mass/nodules Lymph nodes: Cervical, supraclavicular, and axillary nodes normal. Heart:regular rate and rhythm, S1, S2 normal, no murmur, click, rub or gallop Lung:chest clear, no wheezing, rales, normal symmetric air entry Abdomen: soft, non-tender, without masses or organomegaly EXT:no erythema, induration, or nodules   Lab Results: Lab Results  Component Value Date   WBC 4.1 11/20/2012   HGB 14.1 11/20/2012   HCT 41.6 11/20/2012   MCV 82.4 11/20/2012   PLT 151 11/20/2012     Chemistry      Component Value Date/Time   NA 143 11/20/2012  0910   NA 138 06/25/2012 0530   NA 142 02/07/2012 0734   K 4.3 11/20/2012 0910   K 3.4* 06/25/2012 0530   K 4.5 02/07/2012 0734   CL 108* 11/20/2012 0910   CL 106 06/25/2012 0530   CL 104 02/07/2012 0734   CO2 24 11/20/2012 0910   CO2 26 06/25/2012 0530   CO2 26 02/07/2012 0734   BUN 13.0 11/20/2012 0910   BUN 4* 06/25/2012 0530   BUN 13 02/07/2012 0734   CREATININE 0.9 11/20/2012 0910   CREATININE 0.77 06/25/2012 0530   CREATININE 1.0 02/07/2012 0734      Component Value Date/Time   CALCIUM 9.2 11/20/2012 0910   CALCIUM 8.1* 06/25/2012 0530   CALCIUM 9.3 02/07/2012 0734   ALKPHOS 75 11/20/2012 0910   ALKPHOS 59 06/24/2012 0640   ALKPHOS 53 02/07/2012 0734   AST 20 11/20/2012 0910   AST 13 06/24/2012 0640   AST 31 02/07/2012 0734   ALT 25 11/20/2012 0910   ALT 19 06/24/2012 0640   BILITOT 0.42 11/20/2012 0910   BILITOT 0.5 06/24/2012 0640   BILITOT 0.80 02/07/2012 0734     CT CHEST, ABDOMEN AND PELVIS WITH CONTRAST  Technique: Multidetector CT imaging of the chest, abdomen and  pelvis was performed following the standard protocol during bolus  administration of intravenous contrast.  Contrast:  OMNIPAQUE IOHEXOL 300 MG/ML SOLN  Comparison: 08/17/2012  CT CHEST  Findings: No evidence of mediastinal or hilar masses. No  adenopathy seen elsewhere within the thorax. No evidence of  pleural or pericardial effusion.  Tiny 6 mm pulmonary nodule with associated scarring in the right  middle lobe on image 33 remains stable. A few other scattered tiny  pulmonary nodules are also unchanged. No new or enlarging  pulmonary nodules masses are identified. No evidence of pulmonary  infiltrate or central endobronchial lesion. No evidence of chest  wall mass or suspicious bone lesions.  IMPRESSION:  Stable exam. No evidence of metastatic disease or other acute  findings within the thorax.  CT ABDOMEN AND PELVIS  Findings: Hepatic steatosis again demonstrated, but no liver  masses are identified. Prior  cholecystectomy again demonstrated,  without evidence of biliary ductal dilatation. The pancreas,  spleen, adrenal glands, and kidneys are normal in appearance. No  evidence of hydronephrosis. Previously seen nonobstructing distal  right ureteral calculus is no longer present.  No soft tissue masses or lymphadenopathy identified within or  pelvis. No evidence of bowel wall thickening, dilatation, or  hernia. No evidence of inflammatory process or abnormal fluid  collections. A few tiny sub centimeter retroperitoneal lymph nodes  are stable. Is identified.  IMPRESSION:  1. Stable exam. No evidence of recurrent or metastatic carcinoma,  or other acute findings.  2. Stable hepatic steatosis.  3. Interval passage of right ureteral calculus since prior exam.    Impression and Plan: This is a 47 year old gentleman with the following issues:  1. Recurrent KRAS wild-type rectal cancer. He is S/P chemotherapy with FOLFIRI/Erbitux. He developed grade 3 diarrhea and grade 2-3 rash with cycle 1. Did better with subsequent cycles with dose reduction.  CT scans on 10/14/2012 showed no disease at this time. For now, we will continue active follow up and repeat CT scan in 4 months.   2. Neutropenia prophylaxis. Non needed at this time.   3. Skin rash. Due to Erbitux. Resolved.   4. Diarrhea. Due to chemotherapy. Resolved.   5. Nausea/vomiting prophylaxis. He has Zofran and Compazine at home.  6. Hyperlipidemia. On Crestor per PCP.  7. Follow-up. In 4 months with  A CT scan.       Lamorris Knoblock 5/16/201410:16 AM

## 2012-11-25 ENCOUNTER — Encounter: Payer: Self-pay | Admitting: Oncology

## 2012-11-25 NOTE — Progress Notes (Signed)
Per Dr. Clelia Croft, patient is finished with Neulasta. [name] will not be needed Neulasta any more for now.   Thanks,   FS

## 2012-11-26 ENCOUNTER — Ambulatory Visit: Payer: BC Managed Care – PPO | Admitting: Radiation Oncology

## 2012-12-03 ENCOUNTER — Ambulatory Visit: Payer: BC Managed Care – PPO | Admitting: Radiation Oncology

## 2012-12-07 ENCOUNTER — Telehealth: Payer: Self-pay | Admitting: Radiation Oncology

## 2012-12-07 NOTE — Telephone Encounter (Signed)
Patient missed 12/03/2012 follow up with Dr. Kathrynn Running. Phoned patient to assess status. Patient states, "I feel fine; I don't feel like anything new is going on." Encouraged patient to continue to follow up with medical oncology but, know that he can reach [place] in the event he has questions or needs. Provided patient with this writer's contact information. Patient verbalized understanding and expressed appreciation for the call.

## 2013-01-01 ENCOUNTER — Other Ambulatory Visit (HOSPITAL_BASED_OUTPATIENT_CLINIC_OR_DEPARTMENT_OTHER): Payer: BC Managed Care – PPO | Admitting: Lab

## 2013-01-01 ENCOUNTER — Ambulatory Visit (HOSPITAL_BASED_OUTPATIENT_CLINIC_OR_DEPARTMENT_OTHER): Payer: BC Managed Care – PPO

## 2013-01-01 VITALS — BP 162/90 | HR 87 | Temp 97.4°F

## 2013-01-01 DIAGNOSIS — C20 Malignant neoplasm of rectum: Secondary | ICD-10-CM

## 2013-01-01 LAB — CBC WITH DIFFERENTIAL/PLATELET
Basophils Absolute: 0 10*3/uL (ref 0.0–0.1)
Eosinophils Absolute: 0.1 10*3/uL (ref 0.0–0.5)
HGB: 14.1 g/dL (ref 13.0–17.1)
LYMPH%: 24.7 % (ref 14.0–49.0)
MCV: 79.6 fL (ref 79.3–98.0)
MONO#: 0.3 10*3/uL (ref 0.1–0.9)
MONO%: 7.8 % (ref 0.0–14.0)
NEUT#: 2.4 10*3/uL (ref 1.5–6.5)
Platelets: 141 10*3/uL (ref 140–400)
RDW: 13 % (ref 11.0–14.6)
WBC: 3.7 10*3/uL — ABNORMAL LOW (ref 4.0–10.3)

## 2013-01-01 LAB — COMPREHENSIVE METABOLIC PANEL (CC13)
Albumin: 3.7 g/dL (ref 3.5–5.0)
Alkaline Phosphatase: 70 U/L (ref 40–150)
BUN: 10.1 mg/dL (ref 7.0–26.0)
CO2: 21 mEq/L — ABNORMAL LOW (ref 22–29)
Calcium: 9 mg/dL (ref 8.4–10.4)
Glucose: 184 mg/dl — ABNORMAL HIGH (ref 70–140)
Potassium: 3.9 mEq/L (ref 3.5–5.1)
Sodium: 141 mEq/L (ref 136–145)
Total Protein: 6.7 g/dL (ref 6.4–8.3)

## 2013-01-01 LAB — CEA: CEA: 5.9 ng/mL — ABNORMAL HIGH (ref 0.0–5.0)

## 2013-01-01 MED ORDER — SODIUM CHLORIDE 0.9 % IJ SOLN
10.0000 mL | INTRAMUSCULAR | Status: DC | PRN
  Administered 2013-01-01: 10 mL via INTRAVENOUS
  Filled 2013-01-01: qty 10

## 2013-01-01 MED ORDER — HEPARIN SOD (PORK) LOCK FLUSH 100 UNIT/ML IV SOLN
500.0000 [IU] | Freq: Once | INTRAVENOUS | Status: AC
  Administered 2013-01-01: 500 [IU] via INTRAVENOUS
  Filled 2013-01-01: qty 5

## 2013-03-23 ENCOUNTER — Encounter (HOSPITAL_COMMUNITY): Payer: Self-pay

## 2013-03-23 ENCOUNTER — Ambulatory Visit (HOSPITAL_BASED_OUTPATIENT_CLINIC_OR_DEPARTMENT_OTHER): Payer: BC Managed Care – PPO

## 2013-03-23 ENCOUNTER — Other Ambulatory Visit: Payer: BC Managed Care – PPO | Admitting: Lab

## 2013-03-23 ENCOUNTER — Ambulatory Visit (HOSPITAL_COMMUNITY)
Admission: RE | Admit: 2013-03-23 | Discharge: 2013-03-23 | Disposition: A | Discharge status: Home / Self Care | Payer: BC Managed Care – PPO | Source: Ambulatory Visit | Attending: Oncology | Admitting: Oncology

## 2013-03-23 ENCOUNTER — Other Ambulatory Visit (HOSPITAL_BASED_OUTPATIENT_CLINIC_OR_DEPARTMENT_OTHER): Payer: BC Managed Care – PPO | Admitting: Lab

## 2013-03-23 VITALS — BP 157/88 | HR 70 | Temp 98.5°F

## 2013-03-23 DIAGNOSIS — C20 Malignant neoplasm of rectum: Secondary | ICD-10-CM

## 2013-03-23 DIAGNOSIS — C19 Malignant neoplasm of rectosigmoid junction: Secondary | ICD-10-CM | POA: Insufficient documentation

## 2013-03-23 DIAGNOSIS — Z452 Encounter for adjustment and management of vascular access device: Secondary | ICD-10-CM

## 2013-03-23 DIAGNOSIS — R918 Other nonspecific abnormal finding of lung field: Secondary | ICD-10-CM | POA: Insufficient documentation

## 2013-03-23 LAB — COMPREHENSIVE METABOLIC PANEL (CC13)
ALT: 25 U/L (ref 0–55)
Albumin: 3.9 g/dL (ref 3.5–5.0)
CO2: 26 mEq/L (ref 22–29)
Chloride: 108 mEq/L (ref 98–109)
Glucose: 104 mg/dl (ref 70–140)
Potassium: 4 mEq/L (ref 3.5–5.1)
Sodium: 142 mEq/L (ref 136–145)
Total Bilirubin: 0.66 mg/dL (ref 0.20–1.20)
Total Protein: 7.2 g/dL (ref 6.4–8.3)

## 2013-03-23 LAB — CBC WITH DIFFERENTIAL/PLATELET
BASO%: 1.5 % (ref 0.0–2.0)
Eosinophils Absolute: 0.1 10*3/uL (ref 0.0–0.5)
LYMPH%: 24.2 % (ref 14.0–49.0)
MCHC: 34.8 g/dL (ref 32.0–36.0)
MONO#: 0.4 10*3/uL (ref 0.1–0.9)
NEUT#: 2.4 10*3/uL (ref 1.5–6.5)
Platelets: 167 10*3/uL (ref 140–400)
RBC: 5.37 10*6/uL (ref 4.20–5.82)
RDW: 13.9 % (ref 11.0–14.6)
WBC: 3.9 10*3/uL — ABNORMAL LOW (ref 4.0–10.3)
lymph#: 1 10*3/uL (ref 0.9–3.3)

## 2013-03-23 MED ORDER — HEPARIN SOD (PORK) LOCK FLUSH 100 UNIT/ML IV SOLN
500.0000 [IU] | Freq: Once | INTRAVENOUS | Status: AC
  Administered 2013-03-23: 500 [IU] via INTRAVENOUS
  Filled 2013-03-23: qty 5

## 2013-03-23 MED ORDER — IOHEXOL 300 MG/ML  SOLN
100.0000 mL | Freq: Once | INTRAMUSCULAR | Status: AC | PRN
  Administered 2013-03-23: 100 mL via INTRAVENOUS

## 2013-03-23 MED ORDER — SODIUM CHLORIDE 0.9 % IJ SOLN
10.0000 mL | INTRAMUSCULAR | Status: DC | PRN
  Administered 2013-03-23: 10 mL via INTRAVENOUS
  Filled 2013-03-23: qty 10

## 2013-03-24 LAB — CEA: CEA: 46.7 ng/mL — ABNORMAL HIGH (ref 0.0–5.0)

## 2013-03-25 ENCOUNTER — Ambulatory Visit (HOSPITAL_BASED_OUTPATIENT_CLINIC_OR_DEPARTMENT_OTHER): Payer: BC Managed Care – PPO | Admitting: Oncology

## 2013-03-25 ENCOUNTER — Telehealth: Payer: Self-pay | Admitting: Oncology

## 2013-03-25 ENCOUNTER — Other Ambulatory Visit: Payer: Self-pay

## 2013-03-25 VITALS — BP 150/93 | HR 74 | Temp 97.8°F | Resp 20 | Ht 71.0 in | Wt 209.5 lb

## 2013-03-25 DIAGNOSIS — C20 Malignant neoplasm of rectum: Secondary | ICD-10-CM

## 2013-03-25 MED ORDER — OXYCODONE HCL 5 MG PO TABS: 5.0000 mg | ORAL_TABLET | ORAL | Status: DC | PRN

## 2013-03-25 NOTE — Progress Notes (Signed)
Hematology and Oncology Follow Up Visit  [name] [id number] 07/28/1965 47 y.o. 03/25/2013 9:34 AM [name],Eric A, MDBurnett, [name], MD   Principle Diagnosis: The patient is a 47 year old man with recurrent KRAS wild-type  rectal cancer  Prior Therapy:  1. He was diagnosed with node-positive rectal cancer, for which he underwent proctectomy with colon anastomosis and ileostomy on August 09, 2010, at [facility] for a T3 N1 (1 of 18 nodes), moderately differentiated KRAS wild-type adenocarcinoma of the rectum.  2. He went on to receive 6 cycles of oxaliplatin-based therapy with 2 cycles FOLFOX and 4 cycles of XELOX.  3. He then completed external radiation therapy with continuous infusion 5-FU from April 16, 2011, through May 21, 2011.  4. He then underwent surveillance and was found to have recurrent tumor located to an aortocaval lymph node with an elevated CEA of 53 in September 2013. He began localized radiation therapy with CyberKnife concomitantly with Xeloda 1000 mg p.o. b.i.d. on 03/23/2012, completing therapy on 04/21/2012. His CEA level decreased. PET scan did show activity at the aortocaval node chain with persistent activity at the rectal anastomosis.  5. He was started on FOLFIRI with Erbitux on 06/17/12. He received 1 cycle which was complicated by diarrhea.His chemo was restarted at 25% dose reduction on 12/31. He is S/P 6 cycles completed in 09/2012. Follow up imaging studies showed CR.   Current therapy: Observation and follow up.   Interim History:  Mr. Silas presents to the office today for routine follow-up with his wife. Since his last visit, he has been feeling about the same. Diarrhea has now resolved but still has the urge to defecate multiple times a day. He also reported some occasional incontinence that have been chronic in nature.  Appetite has improved and he is gaining back the weight that he lost. Denies chest pain, shortness of breath, abdominal  pain, nausea, vomiting. He has not noticed any bleeding. He is not reporting any respiratory symptoms. His performance status and activity level is essentially normal and performing his work-related duties without any issues. He still using oxycodone for abdominal discomfort which have helped him with cramps and pelvic pain at times.  Medications: I have reviewed the patient's current medications. Current Outpatient Prescriptions  Medication Sig Dispense Refill  . dicyclomine (BENTYL) 10 MG capsule Take 10 mg by mouth 3 (three) times daily.       Marland Kitchen GLUCOSAMINE HCL PO Take 1 tablet by mouth daily.      . Pyridoxine HCl (VITAMIN B-6 PO) Take 1 tablet by mouth daily.      . rosuvastatin (CRESTOR) 5 MG tablet Take 5 mg by mouth at bedtime.      . clindamycin (CLINDAGEL) 1 % gel Apply topically 2 (two) times daily. Apply to areas with rash.  60 g  1  . loperamide (IMODIUM) 2 MG capsule Take 2 mg by mouth 4 (four) times daily as needed. For diarrhea      . Nutritional Supplements (JUICE PLUS FIBRE PO) Take 1 Container by mouth 6 (six) times daily.      . ondansetron (ZOFRAN) 8 MG tablet Take 8 mg by mouth every 12 (twelve) hours as needed. Take 8 mg po BID  X 3  Days after chemo , and as needed for nausea.      . ondansetron (ZOFRAN-ODT) 8 MG disintegrating tablet       . oxyCODONE (OXY IR/ROXICODONE) 5 MG immediate release tablet Take 1 tablet (5 mg  total) by mouth every 4 (four) hours as needed. For pain  90 tablet  0  . prochlorperazine (COMPAZINE) 10 MG tablet Take 10 mg by mouth every 6 (six) hours as needed. Nausea       No current facility-administered medications for this visit.   Facility-Administered Medications Ordered in Other Visits  Medication Dose Route Frequency Provider Last Rate Last Dose  . fluorouracil (ADRUCIL) 3,950 mg in sodium chloride 0.9 % 150 mL chemo infusion  1,800 mg/m2 (Treatment Plan Actual) Intravenous 1 day or 1 dose Myrtis Ser, NP   3,950 mg at 08/05/12 1516   . sodium chloride 0.9 % injection 10 mL  10 mL Intracatheter PRN Benjiman Core, MD      . sodium chloride 0.9 % injection 10 mL  10 mL Intracatheter PRN Myrtis Ser, NP         Allergies:  Allergies  Allergen Reactions  . Penicillins Itching    Intermittent reaction per pt    Past Medical History, Surgical history, Social history, and Family History were reviewed and updated.  Review of Systems:  Remaining ROS negative.  Physical Exam: Blood pressure 150/93, pulse 74, temperature 97.8 F (36.6 C), temperature source Oral, resp. rate 20, height 5\' 11"  (1.803 m), weight 209 lb 8 oz (95.029 kg). ECOG: 0 General appearance: alert, cooperative and no distress Head: Normocephalic, without obvious abnormality, atraumatic Neck: no adenopathy, no carotid bruit, no JVD, supple, symmetrical, trachea midline and thyroid not enlarged, symmetric, no tenderness/mass/nodules Lymph nodes: Cervical, supraclavicular, and axillary nodes normal. Heart:regular rate and rhythm, S1, S2 normal, no murmur, click, rub or gallop Lung:chest clear, no wheezing, rales, normal symmetric air entry Abdomen: soft, non-tender, without masses or organomegaly EXT:no erythema, induration, or nodules   Lab Results: Lab Results  Component Value Date   WBC 3.9* 03/23/2013   HGB 15.4 03/23/2013   HCT 44.3 03/23/2013   MCV 82.5 03/23/2013   PLT 167 03/23/2013     Chemistry      Component Value Date/Time   NA 142 03/23/2013 0819   NA 138 06/25/2012 0530   NA 142 02/07/2012 0734   K 4.0 03/23/2013 0819   K 3.4* 06/25/2012 0530   K 4.5 02/07/2012 0734   CL 108* 11/20/2012 0910   CL 106 06/25/2012 0530   CL 104 02/07/2012 0734   CO2 26 03/23/2013 0819   CO2 26 06/25/2012 0530   CO2 26 02/07/2012 0734   BUN 9.5 03/23/2013 0819   BUN 4* 06/25/2012 0530   BUN 13 02/07/2012 0734   CREATININE 0.8 03/23/2013 0819   CREATININE 0.77 06/25/2012 0530   CREATININE 1.0 02/07/2012 0734      Component Value Date/Time   CALCIUM  9.4 03/23/2013 0819   CALCIUM 8.1* 06/25/2012 0530   CALCIUM 9.3 02/07/2012 0734   ALKPHOS 66 03/23/2013 0819   ALKPHOS 59 06/24/2012 0640   ALKPHOS 53 02/07/2012 0734   AST 21 03/23/2013 0819   AST 13 06/24/2012 0640   AST 31 02/07/2012 0734   ALT 25 03/23/2013 0819   ALT 19 06/24/2012 0640   ALT 37 02/07/2012 0734   BILITOT 0.66 03/23/2013 0819   BILITOT 0.5 06/24/2012 0640   BILITOT 0.80 02/07/2012 0734      CT CHEST, ABDOMEN AND PELVIS WITH CONTRAST  Technique: Multidetector CT imaging of the chest, abdomen and  pelvis was performed following the standard protocol during bolus  administration of intravenous contrast.  Contrast: OMNIPAQUE IOHEXOL 300  MG/ML SOLN  Comparison: 10/14/2012  CT CHEST  Findings: No pleural effusion identified. There is no airspace  consolidation identified. Pulmonary nodule in the right middle  lobe measures 8 mm, image 35/series five. This has spiculated  margins and appears increased from 6 mm previously. Adjacent  nodule within the right middle lobe is stable measuring 5 mm.  Within the left upper lobe there is a 4 mm nodule, image 14/series  5. This is increased from 1-2 mm previously.  The heart size is normal. No pericardial effusion. No mediastinal  or hilar adenopathy. There is no enlarged axillary or  supraclavicular adenopathy.  Review of the visualized osseous structures is unremarkable.  IMPRESSION:  1. Two small pulmonary nodules in the right middle lobe and left  upper lobe appear mildly increased in size from previous exam.  Findings may reflect differences in slice selection/technique.  Cannot rule out recurrence of tumor. It is worth mentioning that  the right middle lobe nodule was not present on the CT examination  from 06/18/2011. On 10/08/2011 at this nodule measured 4 mm.  CT ABDOMEN AND PELVIS  Findings: Mild low attenuation within the liver parenchyma is  identified. There is no suspicious liver abnormality. Prior   cholecystectomy. No biliary dilatation. Normal appearance of the  pancreas. The spleen is unremarkable.  Normal appearance of both adrenal glands. The right kidney is  normal. The left kidney is within normal limits. Urinary bladder  appears normal.  The abdominal aorta has a normal caliber. There is no aneurysm.  There is a small aortocaval lymph node which measures 6 mm, image  74/series 2. This is new from the previous exam. No mesenteric  adenopathy. There is no pelvic or inguinal adenopathy identified.  The stomach is normal. The small bowel loops are within normal  limits. The appendix is visualized and appears normal. Normal  appearance of the proximal colon. Mild, chronic wall thickening  involving the sigmoid colon and rectum is again noted.  Review of the visualized osseous structures is negative for lytic  or sclerotic bone lesion.  IMPRESSION:  1. No acute findings identified within the abdomen or pelvis.  2. Small, sub centimeter aortocaval lymph node is new from  previous exam measuring 6 mm. Attention on follow-up imaging is  advised.    Impression and Plan: This is a 47 year old gentleman with the following issues:  1. Recurrent KRAS wild-type rectal cancer. He is S/P chemotherapy with FOLFIRI/Erbitux.  CT scans on 10/14/2012 showed no disease at this time. Follow up CT scan in 03/23/2013 was discussed today and showed no major changes to suggest relapse disease. But his CEA is elevated and have correlated in the past. I will repeat his scan and CEA in 3 months and decide to re treatment depending on these findings.  2. Neutropenia prophylaxis. Non needed at this time.   3. Skin rash. Due to Erbitux. Resolved.   4. Diarrhea. Due to chemotherapy. Resolved. But now has constant urge to defecate without any actual diarrhea and some occasional incontinence. He has been evaluated by gastroenterology and his surgeon without really a specific diagnosis. Seems to be manageable  at this time.  5. Nausea/vomiting prophylaxis. He has Zofran and Compazine at home.  6. Hyperlipidemia. On Crestor per PCP.  7. Follow-up. In 3 months with  A CT scan.       Quanesha Klimaszewski 9/18/20149:34 AM

## 2013-03-25 NOTE — Telephone Encounter (Signed)
gv pt appt schedule for October and December. [facility] will call pt re ct appt - pt aware.

## 2013-05-07 ENCOUNTER — Ambulatory Visit (HOSPITAL_BASED_OUTPATIENT_CLINIC_OR_DEPARTMENT_OTHER): Payer: BC Managed Care – PPO | Admitting: Lab

## 2013-05-07 ENCOUNTER — Ambulatory Visit (HOSPITAL_BASED_OUTPATIENT_CLINIC_OR_DEPARTMENT_OTHER): Payer: BC Managed Care – PPO

## 2013-05-07 VITALS — BP 153/86 | HR 72 | Temp 97.1°F | Resp 18

## 2013-05-07 DIAGNOSIS — C20 Malignant neoplasm of rectum: Secondary | ICD-10-CM

## 2013-05-07 DIAGNOSIS — C19 Malignant neoplasm of rectosigmoid junction: Secondary | ICD-10-CM

## 2013-05-07 DIAGNOSIS — Z452 Encounter for adjustment and management of vascular access device: Secondary | ICD-10-CM

## 2013-05-07 LAB — COMPREHENSIVE METABOLIC PANEL (CC13)
ALT: 19 U/L (ref 0–55)
AST: 19 U/L (ref 5–34)
Chloride: 109 mEq/L (ref 98–109)
Creatinine: 0.9 mg/dL (ref 0.7–1.3)
Sodium: 141 mEq/L (ref 136–145)
Total Bilirubin: 0.66 mg/dL (ref 0.20–1.20)
Total Protein: 6.9 g/dL (ref 6.4–8.3)

## 2013-05-07 LAB — CBC WITH DIFFERENTIAL/PLATELET
BASO%: 1.3 % (ref 0.0–2.0)
EOS%: 2.6 % (ref 0.0–7.0)
HCT: 42.5 % (ref 38.4–49.9)
LYMPH%: 22.7 % (ref 14.0–49.0)
MCH: 28.3 pg (ref 27.2–33.4)
MCHC: 34.1 g/dL (ref 32.0–36.0)
MONO#: 0.3 10*3/uL (ref 0.1–0.9)
NEUT%: 66.2 % (ref 39.0–75.0)
RBC: 5.14 10*6/uL (ref 4.20–5.82)
WBC: 4.2 10*3/uL (ref 4.0–10.3)
lymph#: 0.9 10*3/uL (ref 0.9–3.3)

## 2013-05-07 MED ORDER — HEPARIN SOD (PORK) LOCK FLUSH 100 UNIT/ML IV SOLN
500.0000 [IU] | Freq: Once | INTRAVENOUS | Status: AC
  Administered 2013-05-07: 500 [IU] via INTRAVENOUS
  Filled 2013-05-07: qty 5

## 2013-05-07 MED ORDER — SODIUM CHLORIDE 0.9 % IJ SOLN
10.0000 mL | INTRAMUSCULAR | Status: DC | PRN
  Administered 2013-05-07: 10 mL via INTRAVENOUS
  Filled 2013-05-07: qty 10

## 2013-05-13 ENCOUNTER — Other Ambulatory Visit: Payer: Self-pay

## 2013-06-22 ENCOUNTER — Other Ambulatory Visit (HOSPITAL_BASED_OUTPATIENT_CLINIC_OR_DEPARTMENT_OTHER): Payer: BC Managed Care – PPO

## 2013-06-22 ENCOUNTER — Ambulatory Visit (HOSPITAL_BASED_OUTPATIENT_CLINIC_OR_DEPARTMENT_OTHER): Payer: BC Managed Care – PPO

## 2013-06-22 ENCOUNTER — Ambulatory Visit (HOSPITAL_COMMUNITY)
Admission: RE | Admit: 2013-06-22 | Discharge: 2013-06-22 | Disposition: A | Discharge status: Home / Self Care | Payer: BC Managed Care – PPO | Source: Ambulatory Visit | Attending: Oncology | Admitting: Oncology

## 2013-06-22 ENCOUNTER — Other Ambulatory Visit: Payer: Self-pay | Admitting: *Deleted

## 2013-06-22 ENCOUNTER — Encounter (HOSPITAL_COMMUNITY): Payer: Self-pay

## 2013-06-22 VITALS — BP 136/81 | HR 77 | Temp 97.5°F

## 2013-06-22 DIAGNOSIS — K7689 Other specified diseases of liver: Secondary | ICD-10-CM | POA: Insufficient documentation

## 2013-06-22 DIAGNOSIS — C20 Malignant neoplasm of rectum: Secondary | ICD-10-CM

## 2013-06-22 DIAGNOSIS — I700 Atherosclerosis of aorta: Secondary | ICD-10-CM | POA: Insufficient documentation

## 2013-06-22 DIAGNOSIS — Z95828 Presence of other vascular implants and grafts: Secondary | ICD-10-CM

## 2013-06-22 DIAGNOSIS — Z9221 Personal history of antineoplastic chemotherapy: Secondary | ICD-10-CM | POA: Insufficient documentation

## 2013-06-22 DIAGNOSIS — Q8909 Congenital malformations of spleen: Secondary | ICD-10-CM | POA: Insufficient documentation

## 2013-06-22 DIAGNOSIS — R918 Other nonspecific abnormal finding of lung field: Secondary | ICD-10-CM | POA: Insufficient documentation

## 2013-06-22 DIAGNOSIS — C19 Malignant neoplasm of rectosigmoid junction: Secondary | ICD-10-CM | POA: Insufficient documentation

## 2013-06-22 DIAGNOSIS — R599 Enlarged lymph nodes, unspecified: Secondary | ICD-10-CM | POA: Insufficient documentation

## 2013-06-22 DIAGNOSIS — I708 Atherosclerosis of other arteries: Secondary | ICD-10-CM | POA: Insufficient documentation

## 2013-06-22 LAB — CBC WITH DIFFERENTIAL/PLATELET
Basophils Absolute: 0 10*3/uL (ref 0.0–0.1)
EOS%: 1.8 % (ref 0.0–7.0)
Eosinophils Absolute: 0.1 10*3/uL (ref 0.0–0.5)
HCT: 44.5 % (ref 38.4–49.9)
HGB: 15.1 g/dL (ref 13.0–17.1)
LYMPH%: 20.2 % (ref 14.0–49.0)
MCH: 28.4 pg (ref 27.2–33.4)
MCV: 83.7 fL (ref 79.3–98.0)
MONO%: 10.1 % (ref 0.0–14.0)
NEUT%: 67.2 % (ref 39.0–75.0)
Platelets: 156 10*3/uL (ref 140–400)
RDW: 13.3 % (ref 11.0–14.6)
lymph#: 0.8 10*3/uL — ABNORMAL LOW (ref 0.9–3.3)

## 2013-06-22 LAB — COMPREHENSIVE METABOLIC PANEL (CC13)
AST: 20 U/L (ref 5–34)
Anion Gap: 9 mEq/L (ref 3–11)
BUN: 8.7 mg/dL (ref 7.0–26.0)
Calcium: 9.5 mg/dL (ref 8.4–10.4)
Chloride: 107 mEq/L (ref 98–109)
Creatinine: 0.8 mg/dL (ref 0.7–1.3)
Total Bilirubin: 0.61 mg/dL (ref 0.20–1.20)
Total Protein: 7.1 g/dL (ref 6.4–8.3)

## 2013-06-22 MED ORDER — IOHEXOL 300 MG/ML  SOLN
100.0000 mL | Freq: Once | INTRAMUSCULAR | Status: AC | PRN
  Administered 2013-06-22: 100 mL via INTRAVENOUS

## 2013-06-22 MED ORDER — SODIUM CHLORIDE 0.9 % IJ SOLN
10.0000 mL | INTRAMUSCULAR | Status: DC | PRN
  Administered 2013-06-22: 10 mL via INTRAVENOUS
  Filled 2013-06-22: qty 10

## 2013-06-22 MED ORDER — HEPARIN SOD (PORK) LOCK FLUSH 100 UNIT/ML IV SOLN
500.0000 [IU] | Freq: Once | INTRAVENOUS | Status: AC
  Administered 2013-06-22: 500 [IU] via INTRAVENOUS
  Filled 2013-06-22: qty 5

## 2013-06-24 ENCOUNTER — Telehealth: Payer: Self-pay | Admitting: Oncology

## 2013-06-24 ENCOUNTER — Ambulatory Visit (HOSPITAL_BASED_OUTPATIENT_CLINIC_OR_DEPARTMENT_OTHER): Payer: BC Managed Care – PPO | Admitting: Oncology

## 2013-06-24 VITALS — BP 146/83 | HR 73 | Temp 98.3°F | Resp 18 | Ht 71.0 in | Wt 204.6 lb

## 2013-06-24 DIAGNOSIS — C20 Malignant neoplasm of rectum: Secondary | ICD-10-CM

## 2013-06-24 MED ORDER — OXYCODONE HCL 5 MG PO TABS: 5.0000 mg | ORAL_TABLET | ORAL | Status: DC | PRN

## 2013-06-24 NOTE — Telephone Encounter (Signed)
gv and printed appt sched and avs for pt for Feb adn April 2015....gv pt barium

## 2013-06-24 NOTE — Progress Notes (Signed)
Hematology and Oncology Follow Up Visit  [name] [id number] 1965-11-02 47 y.o. 06/24/2013 4:13 PM Steele,Eric A, MDBurnett, Eric Bern, MD   Principle Diagnosis: The patient is a 47 year old man with recurrent KRAS wild-type  rectal cancer  Prior Therapy:  1. He was diagnosed with node-positive rectal cancer, for which he underwent proctectomy with colon anastomosis and ileostomy on August 09, 2010, at [facility] for a T3 N1 (1 of 18 nodes), moderately differentiated KRAS wild-type adenocarcinoma of the rectum.  2. He went on to receive 6 cycles of oxaliplatin-based therapy with 2 cycles FOLFOX and 4 cycles of XELOX.  3. He then completed external radiation therapy with continuous infusion 5-FU from April 16, 2011, through May 21, 2011.  4. He then underwent surveillance and was found to have recurrent tumor located to an aortocaval lymph node with an elevated CEA of 53 in September 2013. He began localized radiation therapy with CyberKnife concomitantly with Xeloda 1000 mg p.o. b.i.d. on 03/23/2012, completing therapy on 04/21/2012. His CEA level decreased. PET scan did show activity at the aortocaval node chain with persistent activity at the rectal anastomosis.  5. He was started on FOLFIRI with Erbitux on 06/17/12. He received 1 cycle which was complicated by diarrhea.His chemo was restarted at 25% dose reduction on 12/31. He is S/P 6 cycles completed in 09/2012. Follow up imaging studies showed CR.   Current therapy: Observation and follow up.   Interim History:  Mr. Chovanec presents to the office today for routine follow-up with his wife. Since his last visit, he has been feeling about the same.  He also reported some occasional incontinence that have been chronic in nature.  Appetite has improved and he is gaining back the weight that he lost. Denies chest pain, shortness of breath, abdominal pain, nausea, vomiting. He has not noticed any bleeding. He is not reporting any  respiratory symptoms. His performance status and activity level is essentially normal and performing his work-related duties without any issues. He still using oxycodone for abdominal discomfort which have helped him with cramps and pelvic pain at times. Is not reporting any recent illnesses or hospitalizations.  Medications: I have reviewed the patient's current medications. Current Outpatient Prescriptions  Medication Sig Dispense Refill  . clindamycin (CLINDAGEL) 1 % gel Apply topically 2 (two) times daily. Apply to areas with rash.  60 g  1  . dicyclomine (BENTYL) 10 MG capsule Take 10 mg by mouth 3 (three) times daily.       Marland Kitchen GLUCOSAMINE HCL PO Take 1 tablet by mouth daily.      Marland Kitchen loperamide (IMODIUM) 2 MG capsule Take 2 mg by mouth 4 (four) times daily as needed. For diarrhea      . Nutritional Supplements (JUICE PLUS FIBRE PO) Take 1 Container by mouth 6 (six) times daily.      . ondansetron (ZOFRAN) 8 MG tablet Take 8 mg by mouth every 12 (twelve) hours as needed. Take 8 mg po BID  X 3  Days after chemo , and as needed for nausea.      . ondansetron (ZOFRAN-ODT) 8 MG disintegrating tablet       . oxyCODONE (OXY IR/ROXICODONE) 5 MG immediate release tablet Take 1 tablet (5 mg total) by mouth every 4 (four) hours as needed. For pain  90 tablet  0  . prochlorperazine (COMPAZINE) 10 MG tablet Take 10 mg by mouth every 6 (six) hours as needed. Nausea      .  Pyridoxine HCl (VITAMIN B-6 PO) Take 1 tablet by mouth daily.      . rosuvastatin (CRESTOR) 5 MG tablet Take 5 mg by mouth at bedtime.       No current facility-administered medications for this visit.   Facility-Administered Medications Ordered in Other Visits  Medication Dose Route Frequency Provider Last Rate Last Dose  . fluorouracil (ADRUCIL) 3,950 mg in sodium chloride 0.9 % 150 mL chemo infusion  1,800 mg/m2 (Treatment Plan Actual) Intravenous 1 day or 1 dose Myrtis Ser, NP   3,950 mg at 08/05/12 1516  . sodium chloride 0.9  % injection 10 mL  10 mL Intracatheter PRN Benjiman Core, MD      . sodium chloride 0.9 % injection 10 mL  10 mL Intracatheter PRN Myrtis Ser, NP         Allergies:  Allergies  Allergen Reactions  . Penicillins Itching    Intermittent reaction per pt    Past Medical History, Surgical history, Social history, and Family History were reviewed and updated.  Review of Systems:  Remaining ROS negative.  Physical Exam: Blood pressure 146/83, pulse 73, temperature 98.3 F (36.8 C), temperature source Oral, resp. rate 18, height 5\' 11"  (1.803 m), weight 204 lb 9.6 oz (92.806 kg). ECOG: 0 General appearance: alert, cooperative and no distress Head: Normocephalic, without obvious abnormality, atraumatic Neck: no adenopathy, no carotid bruit, no JVD, supple, symmetrical, trachea midline and thyroid not enlarged, symmetric, no tenderness/mass/nodules Lymph nodes: Cervical, supraclavicular, and axillary nodes normal. Heart:regular rate and rhythm, S1, S2 normal, no murmur, click, rub or gallop Lung:chest clear, no wheezing, rales, normal symmetric air entry Abdomen: soft, non-tender, without masses or organomegaly EXT:no erythema, induration, or nodules   Lab Results: Lab Results  Component Value Date   WBC 3.7* 06/22/2013   HGB 15.1 06/22/2013   HCT 44.5 06/22/2013   MCV 83.7 06/22/2013   PLT 156 06/22/2013     Chemistry      Component Value Date/Time   NA 141 06/22/2013 0840   NA 138 06/25/2012 0530   NA 142 02/07/2012 0734   K 3.9 06/22/2013 0840   K 3.4* 06/25/2012 0530   K 4.5 02/07/2012 0734   CL 108* 11/20/2012 0910   CL 106 06/25/2012 0530   CL 104 02/07/2012 0734   CO2 26 06/22/2013 0840   CO2 26 06/25/2012 0530   CO2 26 02/07/2012 0734   BUN 8.7 06/22/2013 0840   BUN 4* 06/25/2012 0530   BUN 13 02/07/2012 0734   CREATININE 0.8 06/22/2013 0840   CREATININE 0.77 06/25/2012 0530   CREATININE 1.0 02/07/2012 0734      Component Value Date/Time   CALCIUM 9.5  06/22/2013 0840   CALCIUM 8.1* 06/25/2012 0530   CALCIUM 9.3 02/07/2012 0734   ALKPHOS 62 06/22/2013 0840   ALKPHOS 59 06/24/2012 0640   ALKPHOS 53 02/07/2012 0734   AST 20 06/22/2013 0840   AST 13 06/24/2012 0640   AST 31 02/07/2012 0734   ALT 24 06/22/2013 0840   ALT 19 06/24/2012 0640   ALT 37 02/07/2012 0734   BILITOT 0.61 06/22/2013 0840   BILITOT 0.5 06/24/2012 0640   BILITOT 0.80 02/07/2012 0734     EXAM:  CT CHEST, ABDOMEN, AND PELVIS WITH CONTRAST  TECHNIQUE:  Multidetector CT imaging of the chest, abdomen and pelvis was  performed following the standard protocol during bolus  administration of intravenous contrast.  CONTRAST: OMNIPAQUE IOHEXOL 300 MG/ML SOLN  COMPARISON:  03/24/2015  FINDINGS:  CT CHEST FINDINGS  Left upper lobe nodule on image 15 measures 4.3 mm compared to 3.6  mm previously. Visually, this appears larger although measuring  minimally so. Right middle lobe nodule on image 34 measures 4.7 mm  compared with 4.6 mm previously. Peripheral right middle lobe  pulmonary nodule on image 37 measures 10.4 mm compared with 8 mm  previously. Small nodule in the right lung base on image 51 measures  3.7 mm. This was more difficult to see on prior study but likely was  present measuring 2.4 mm. Tiny nodule in the left lower lobe on  image 38 measures approximately 2 mm, not visualized on prior study.  It is unclear if this is because this nodule is new or could have  been related to scan planes given its very small size.  Anterior lingular nodule on image 23 Early measures 3 mm common also  not definitively seen on prior study. No pleural effusions.  Heart is normal size. Aorta is normal caliber. No mediastinal,  hilar, or axillary adenopathy. Chest wall soft tissues are  unremarkable.  No focal osseous abnormality.  CT ABDOMEN AND PELVIS FINDINGS  Mild diffuse fatty infiltration of the liver. No visible focal  abnormality. Prior cholecystectomy. Spleen,  pancreas, adrenals and  kidneys are normal.  Small aortocaval node is again noted, measuring 6 mm in short axis  diameter on image 75, stable. However, on the same image, there is a  left periaortic lymph node which is larger than prior study,  measuring 1.9 x 0.8 cm on compared with 1.3 x 0.6 cm previously. No  new retroperitoneal lymph nodes. Accessory spleen noted in the  splenic hilum.  Aorta and iliac vessels are calcified, non aneurysmal. Stomach is  normal. Small bowel is decompressed. Stable mild chronic wall  thickening with in the rectosigmoid colon. No free fluid or free  air.  No acute or focal bony abnormality.  IMPRESSION:  The left upper lobe nodule in the largest right middle lobe nodule  are slightly larger on today's study when compared to prior study.  In addition, new nodules are now seen in the right lung base, left  lower lobe and left upper lobe/lingula as described above. While  these remain nonspecific, the slight change/enlargement is  concerning for possible metastases.  Slight enlargement of a left periaortic retroperitoneal lymph node.  Diffuse fatty infiltration of the liver.    Impression and Plan: This is a 47 year old gentleman with the following issues:  1. Recurrent KRAS wild-type rectal cancer. His last chemotherapy consistent of  FOLFIRI/Erbitux. Follow up CT scan in 06/22/2013 was discussed today and showed no major changes to suggest relapse disease he continues to have very small pulmonary nodules have been detected in the past.. But his CEA is elevated and have correlated in the past. Options of treatments were discussed today given the current findings. One option is to continue observation and repeat imaging studies on active basis and restart chemotherapy upon clear-cut progression. The other option is to biopsy his dominant lung lesion and the third option would be to restart chemotherapy now. I see very little if any in terms of volume of  disease to warrant restarting systemic chemotherapy and he agrees with that at this time and would like to defer chemotherapy unless absolutely necessary.  2. Neutropenia prophylaxis. Non needed at this time.   3. Skin rash. Due to Erbitux. Resolved.   4. Diarrhea. Due to chemotherapy. Resolved.  5. Port-A-Cath management: We will continue with routine flushes at this time.  6. Hyperlipidemia. On Crestor per PCP.  7. Follow-up. In 4 months with  A CT scan.       [name] 12/18/20144:13 PM

## 2013-08-19 ENCOUNTER — Ambulatory Visit (HOSPITAL_BASED_OUTPATIENT_CLINIC_OR_DEPARTMENT_OTHER): Payer: BC Managed Care – PPO

## 2013-08-19 VITALS — BP 129/74 | HR 75 | Temp 97.9°F

## 2013-08-19 DIAGNOSIS — Z452 Encounter for adjustment and management of vascular access device: Secondary | ICD-10-CM

## 2013-08-19 DIAGNOSIS — C20 Malignant neoplasm of rectum: Secondary | ICD-10-CM

## 2013-08-19 DIAGNOSIS — Z95828 Presence of other vascular implants and grafts: Secondary | ICD-10-CM

## 2013-08-19 MED ORDER — SODIUM CHLORIDE 0.9 % IJ SOLN
10.0000 mL | INTRAMUSCULAR | Status: DC | PRN
  Administered 2013-08-19: 10 mL via INTRAVENOUS
  Filled 2013-08-19: qty 10

## 2013-08-19 MED ORDER — HEPARIN SOD (PORK) LOCK FLUSH 100 UNIT/ML IV SOLN
500.0000 [IU] | Freq: Once | INTRAVENOUS | Status: AC
  Administered 2013-08-19: 500 [IU] via INTRAVENOUS
  Filled 2013-08-19: qty 5

## 2013-10-19 ENCOUNTER — Encounter (HOSPITAL_COMMUNITY): Payer: Self-pay

## 2013-10-19 ENCOUNTER — Ambulatory Visit: Payer: BC Managed Care – PPO

## 2013-10-19 ENCOUNTER — Other Ambulatory Visit (HOSPITAL_BASED_OUTPATIENT_CLINIC_OR_DEPARTMENT_OTHER): Payer: BC Managed Care – PPO

## 2013-10-19 ENCOUNTER — Ambulatory Visit (HOSPITAL_COMMUNITY)
Admission: RE | Admit: 2013-10-19 | Discharge: 2013-10-19 | Disposition: A | Discharge status: Home / Self Care | Payer: BC Managed Care – PPO | Source: Ambulatory Visit | Attending: Oncology | Admitting: Oncology

## 2013-10-19 VITALS — BP 135/76 | HR 67 | Temp 98.3°F

## 2013-10-19 DIAGNOSIS — Z95828 Presence of other vascular implants and grafts: Secondary | ICD-10-CM

## 2013-10-19 DIAGNOSIS — C7800 Secondary malignant neoplasm of unspecified lung: Secondary | ICD-10-CM | POA: Insufficient documentation

## 2013-10-19 DIAGNOSIS — C20 Malignant neoplasm of rectum: Secondary | ICD-10-CM

## 2013-10-19 DIAGNOSIS — R599 Enlarged lymph nodes, unspecified: Secondary | ICD-10-CM | POA: Insufficient documentation

## 2013-10-19 DIAGNOSIS — K7689 Other specified diseases of liver: Secondary | ICD-10-CM | POA: Insufficient documentation

## 2013-10-19 LAB — COMPREHENSIVE METABOLIC PANEL (CC13)
ALK PHOS: 63 U/L (ref 40–150)
ALT: 16 U/L (ref 0–55)
AST: 19 U/L (ref 5–34)
Albumin: 3.9 g/dL (ref 3.5–5.0)
Anion Gap: 9 mEq/L (ref 3–11)
BUN: 12.9 mg/dL (ref 7.0–26.0)
CALCIUM: 9.2 mg/dL (ref 8.4–10.4)
CO2: 25 mEq/L (ref 22–29)
Chloride: 110 mEq/L — ABNORMAL HIGH (ref 98–109)
Creatinine: 0.8 mg/dL (ref 0.7–1.3)
Glucose: 90 mg/dl (ref 70–140)
Potassium: 4.1 mEq/L (ref 3.5–5.1)
SODIUM: 144 meq/L (ref 136–145)
TOTAL PROTEIN: 6.8 g/dL (ref 6.4–8.3)
Total Bilirubin: 0.54 mg/dL (ref 0.20–1.20)

## 2013-10-19 LAB — CBC WITH DIFFERENTIAL/PLATELET
BASO%: 1 % (ref 0.0–2.0)
BASOS ABS: 0 10*3/uL (ref 0.0–0.1)
EOS%: 3.5 % (ref 0.0–7.0)
Eosinophils Absolute: 0.1 10*3/uL (ref 0.0–0.5)
HCT: 43.1 % (ref 38.4–49.9)
HGB: 14.6 g/dL (ref 13.0–17.1)
LYMPH#: 0.9 10*3/uL (ref 0.9–3.3)
LYMPH%: 22.5 % (ref 14.0–49.0)
MCH: 28.4 pg (ref 27.2–33.4)
MCHC: 33.9 g/dL (ref 32.0–36.0)
MCV: 83.8 fL (ref 79.3–98.0)
MONO#: 0.3 10*3/uL (ref 0.1–0.9)
MONO%: 8.3 % (ref 0.0–14.0)
NEUT%: 64.7 % (ref 39.0–75.0)
NEUTROS ABS: 2.6 10*3/uL (ref 1.5–6.5)
Platelets: 172 10*3/uL (ref 140–400)
RBC: 5.15 10*6/uL (ref 4.20–5.82)
RDW: 13.6 % (ref 11.0–14.6)
WBC: 4.1 10*3/uL (ref 4.0–10.3)

## 2013-10-19 LAB — CEA: CEA: 61 ng/mL — ABNORMAL HIGH (ref 0.0–5.0)

## 2013-10-19 MED ORDER — SODIUM CHLORIDE 0.9 % IJ SOLN
10.0000 mL | INTRAMUSCULAR | Status: DC | PRN
  Administered 2013-10-19: 10 mL via INTRAVENOUS
  Filled 2013-10-19: qty 10

## 2013-10-19 MED ORDER — IOHEXOL 300 MG/ML  SOLN
100.0000 mL | Freq: Once | INTRAMUSCULAR | Status: AC | PRN
  Administered 2013-10-19: 100 mL via INTRAVENOUS

## 2013-10-19 MED ORDER — HEPARIN SOD (PORK) LOCK FLUSH 100 UNIT/ML IV SOLN
500.0000 [IU] | Freq: Once | INTRAVENOUS | Status: AC
  Administered 2013-10-19: 500 [IU] via INTRAVENOUS
  Filled 2013-10-19: qty 5

## 2013-10-19 NOTE — Patient Instructions (Signed)

## 2013-10-21 ENCOUNTER — Ambulatory Visit (HOSPITAL_BASED_OUTPATIENT_CLINIC_OR_DEPARTMENT_OTHER): Payer: BC Managed Care – PPO | Admitting: Oncology

## 2013-10-21 ENCOUNTER — Other Ambulatory Visit: Payer: Self-pay | Admitting: *Deleted

## 2013-10-21 ENCOUNTER — Encounter: Payer: Self-pay | Admitting: Oncology

## 2013-10-21 VITALS — BP 151/80 | HR 80 | Temp 97.9°F | Resp 18 | Ht 71.0 in | Wt 205.3 lb

## 2013-10-21 DIAGNOSIS — C20 Malignant neoplasm of rectum: Secondary | ICD-10-CM

## 2013-10-21 DIAGNOSIS — E785 Hyperlipidemia, unspecified: Secondary | ICD-10-CM

## 2013-10-21 DIAGNOSIS — C189 Malignant neoplasm of colon, unspecified: Secondary | ICD-10-CM

## 2013-10-21 DIAGNOSIS — R21 Rash and other nonspecific skin eruption: Secondary | ICD-10-CM

## 2013-10-21 DIAGNOSIS — R197 Diarrhea, unspecified: Secondary | ICD-10-CM

## 2013-10-21 MED ORDER — OXYCODONE HCL 5 MG PO TABS: 5.0000 mg | ORAL_TABLET | ORAL | Status: DC | PRN

## 2013-10-21 NOTE — Progress Notes (Signed)
Hematology and Oncology Follow Up Visit  [name] [id number] 05-Aug-1965 48 y.o. 10/21/2013 1:58 PM [name],[name], MDBurnett, Michel Santee, MD   Principle Diagnosis: The patient is a 48 year old man with recurrent KRAS wild-type  rectal cancer  Prior Therapy:  1. He was diagnosed with node-positive rectal cancer, for which he underwent proctectomy with colon anastomosis and ileostomy on August 09, 2010, at [facility] for a T3 N1 (1 of 18 nodes), moderately differentiated KRAS wild-type adenocarcinoma of the rectum.  2. He went on to receive 6 cycles of oxaliplatin-based therapy with 2 cycles FOLFOX and 4 cycles of XELOX.  3. He then completed external radiation therapy with continuous infusion 5-FU from April 16, 2011, through May 21, 2011.  4. He then underwent surveillance and was found to have recurrent tumor located to an aortocaval lymph node with an elevated CEA of 53 in September 2013. He began localized radiation therapy with CyberKnife concomitantly with Xeloda 1000 mg p.o. b.i.d. on 03/23/2012, completing therapy on 04/21/2012. His CEA level decreased. PET scan did show activity at the aortocaval node chain with persistent activity at the rectal anastomosis.  5. He was started on FOLFIRI with Erbitux on 06/17/12. He received 1 cycle which was complicated by diarrhea.His chemo was restarted at 25% dose reduction on 12/31. He is S/P 6 cycles completed in 09/2012. Follow up imaging studies showed CR.   Current therapy: Observation and follow up.   Interim History:  [name] presents to the office today for routine follow-up with his wife. Since his last visit, he has been feeling about the same.  Appetite has improved and he is gaining back the weight that he lost. Denies chest pain, shortness of breath, abdominal pain, nausea, vomiting. He has not noticed any bleeding. He is not reporting any respiratory symptoms. His performance status and activity level is essentially normal  and performing his work-related duties without any issues.He still using oxycodone for abdominal discomfort which have helped him with cramps and pelvic pain at times. Is not reporting any recent illnesses or hospitalizations. He is not reporting any hemoptysis or hematemesis. Has not reported any change in his performance status or activity level.  Medications: I have reviewed the patient's current medications. Current Outpatient Prescriptions  Medication Sig Dispense Refill  . clindamycin (CLINDAGEL) 1 % gel Apply topically 2 (two) times daily. Apply to areas with rash.  60 g  1  . dicyclomine (BENTYL) 10 MG capsule Take 10 mg by mouth 3 (three) times daily.       Marland Steele GLUCOSAMINE HCL PO Take 1 tablet by mouth daily.      Marland Steele loperamide (IMODIUM) 2 MG capsule Take 2 mg by mouth 4 (four) times daily as needed. For diarrhea      . ondansetron (ZOFRAN) 8 MG tablet Take 8 mg by mouth every 12 (twelve) hours as needed. Take 8 mg po BID  X 3  Days after chemo , and as needed for nausea.      . ondansetron (ZOFRAN-ODT) 8 MG disintegrating tablet       . oxyCODONE (OXY IR/ROXICODONE) 5 MG immediate release tablet Take 1 tablet (5 mg total) by mouth every 4 (four) hours as needed. For pain  90 tablet  0  . prochlorperazine (COMPAZINE) 10 MG tablet Take 10 mg by mouth every 6 (six) hours as needed. Nausea      . Pyridoxine HCl (VITAMIN B-6 PO) Take 1 tablet by mouth daily.      Marland Steele  rosuvastatin (CRESTOR) 5 MG tablet Take 5 mg by mouth at bedtime.       No current facility-administered medications for this visit.   Facility-Administered Medications Ordered in Other Visits  Medication Dose Route Frequency Provider Last Rate Last Dose  . fluorouracil (ADRUCIL) 3,950 mg in sodium chloride 0.9 % 150 mL chemo infusion  1,800 mg/m2 (Treatment Plan Actual) Intravenous 1 day or 1 dose Maryanna Shape, NP   3,950 mg at 08/05/12 1516  . sodium chloride 0.9 % injection 10 mL  10 mL Intracatheter PRN Wyatt Portela, MD       . sodium chloride 0.9 % injection 10 mL  10 mL Intracatheter PRN Maryanna Shape, NP         Allergies:  Allergies  Allergen Reactions  . Penicillins Itching    Intermittent reaction per pt    Past Medical History, Surgical history, Social history, and Family History were reviewed and updated.  Review of Systems:  Remaining ROS negative.  Physical Exam: Blood pressure 151/80, pulse 80, temperature 97.9 F (36.6 C), temperature source Oral, resp. rate 18, height 5' 11"  (1.803 m), weight 205 lb 4.8 oz (93.123 kg), SpO2 99.00%. ECOG: 0 General appearance: alert, cooperative and no distress Head: Normocephalic, without obvious abnormality, atraumatic Neck: no adenopathy, no carotid bruit, no JVD, supple, symmetrical, trachea midline and thyroid not enlarged, symmetric, no tenderness/mass/nodules Lymph nodes: Cervical, supraclavicular, and axillary nodes normal. Heart:regular rate and rhythm, S1, S2 normal, no murmur, click, rub or gallop Lung:chest clear, no wheezing, rales, normal symmetric air entry Abdomen: soft, non-tender, without masses or organomegaly EXT:no erythema, induration, or nodules   Lab Results: Lab Results  Component Value Date   WBC 4.1 10/19/2013   HGB 14.6 10/19/2013   HCT 43.1 10/19/2013   MCV 83.8 10/19/2013   PLT 172 10/19/2013     Chemistry      Component Value Date/Time   NA 144 10/19/2013 0905   NA 138 06/25/2012 0530   NA 142 02/07/2012 0734   K 4.1 10/19/2013 0905   K 3.4* 06/25/2012 0530   K 4.5 02/07/2012 0734   CL 108* 11/20/2012 0910   CL 106 06/25/2012 0530   CL 104 02/07/2012 0734   CO2 25 10/19/2013 0905   CO2 26 06/25/2012 0530   CO2 26 02/07/2012 0734   BUN 12.9 10/19/2013 0905   BUN 4* 06/25/2012 0530   BUN 13 02/07/2012 0734   CREATININE 0.8 10/19/2013 0905   CREATININE 0.77 06/25/2012 0530   CREATININE 1.0 02/07/2012 0734      Component Value Date/Time   CALCIUM 9.2 10/19/2013 0905   CALCIUM 8.1* 06/25/2012 0530   CALCIUM 9.3 02/07/2012  0734   ALKPHOS 63 10/19/2013 0905   ALKPHOS 59 06/24/2012 0640   ALKPHOS 53 02/07/2012 0734   AST 19 10/19/2013 0905   AST 13 06/24/2012 0640   AST 31 02/07/2012 0734   ALT 16 10/19/2013 0905   ALT 19 06/24/2012 0640   ALT 37 02/07/2012 0734   BILITOT 0.54 10/19/2013 0905   BILITOT 0.5 06/24/2012 0640   BILITOT 0.80 02/07/2012 0734     Results for FORNEY, KLEINPETER (MRN [id number]) as of 10/21/2013 12:53  Ref. Range 05/07/2013 09:21 06/22/2013 08:40 10/19/2013 09:08  CEA Latest Range: 0.0-5.0 ng/mL 33.7 (H) 35.0 (H) 61.0 (H)   EXAM:  CT CHEST, ABDOMEN, AND PELVIS WITH CONTRAST  TECHNIQUE:  Multidetector CT imaging of the chest, abdomen and pelvis was  performed following the  standard protocol during bolus  administration of intravenous contrast.  CONTRAST: 155m OMNIPAQUE IOHEXOL 300 MG/ML SOLN  COMPARISON: 06/22/2013  FINDINGS:  CT CHEST FINDINGS  Dominant pulmonary nodule in the right middle lobe on image 34  currently measures 12 x 12 mm compared to 8 x 10 mm previously.  There are numerous other tiny pulmonary nodules in both lungs  measuring 5 mm or less. These show mild increase in both size and  number in the lower lobes compared to previous study. This  consistent with mild progression of pulmonary metastases.  No evidence of mediastinal or hilar lymphadenopathy. No adenopathy  seen elsewhere within the thorax. No evidence of pleural or  pericardial effusion. No suspicious bone lesions identified.  CT ABDOMEN AND PELVIS FINDINGS  Small right retrocrural lymph nodes measuring up to 8 mm in short  axis are stable. Mild retroperitoneal lymphadenopathy is again seen,  with largest lymph node in the left paraaortic region on image 73  currently measuring 11 mm in short axis compared to 8 mm previously.  No new areas of lymphadenopathy identified within the abdomen or  pelvis.  Mild hepatic steatosis is again noted. Heterogeneously enhancing  lesion is seen in segment 3 of the left  hepatic lobe which measures  3.2 x 2.9 cm on image 46 of series 2. This appears new or increased  compared to multiple previous studies and is suspicious for a liver  metastasis. No other liver lesions are identified.  The spleen, pancreas, adrenal glands, and kidneys are normal in  appearance. No other soft tissue masses or lymphadenopathy  identified within the abdomen or pelvis.  No evidence of inflammatory process or abnormal fluid collections.  No evidence of bowel wall thickening or dilatation. Stable soft  tissue stranding seen in perirectal region presacral space, without  evidence of focal mass. No suspicious bone lesions identified.  IMPRESSION:  Minimal interval progression of bilateral pulmonary metastases.  Mild increase in abdominal retroperitoneal lymphadenopathy. Stable  small right retrocrural lymph nodes.  3 cm heterogeneously enhancing lesion in the left hepatic lobe which  is new or increased since previous exams, and suspicious for liver  metastasis. Consider ultrasound-guided needle biopsy versus abdomen  MRI without and with contrast for further characterization. This  recommendation follows ACR consensus guidelines: Managing Incidental  Findings on Abdominal CT: White Paper of the ACR Incidental Findings  Committee. J Am Coll Radiol 2010;7:754-773     Impression and Plan: This is a 48year old gentleman with the following issues:  1. Recurrent KRAS wild-type rectal cancer. His last chemotherapy consistent of  FOLFIRI/Erbitux. Follow up CT scan from 10/19/2013 was discussed today and indicate clear progression of his disease. There is retroperitoneal lymphadenopathy, increased in his lung nodule and possibly new liver lesion. His CEA is also more elevated indicating certainly progression of disease. I see you really no other way of on restarting systemic chemotherapy. I have recommended starting with FOLFIRI and Erbitux . The risks and benefits of this regimen were  discussed again. Complications that includes nausea, vomiting, fatigue, myelosuppression, neutropenia, neutropenic sepsis among other complications were discussed. Complication from Erbitux that include infusion-related toxicity as well as skin rash. He was very upset and disappointed about these findings. Is not really sure what he wants to do although I have encouraged him and really urged him to consider systemic chemotherapy at this point. I also offered [facility]. I explained  to him there is really no role for surgery or radiation therapy at this time. All his questions were answered today he will let me know in the near future about what he was to proceed. I urged him to make a decision in the near future and not to delay the start of chemotherapy very well. I also reiterated to him that this is an incurable process and will likely require systemic chemotherapy on and off to the rest of his life.  2. Neutropenia prophylaxis. Non needed at this time.   3. Skin rash. Due to Erbitux. Resolved now but I have educated him about the possibility of this recurring.  4. Diarrhea. Due to chemotherapy. Resolved for now but I have educated him about possibility of it recurring per  5. Port-A-Cath management: We will continue with routine flushes at this time.  6. Hyperlipidemia. On Crestor per PCP.  7. Follow-up. Be determined depending on his decision want to proceed with chemotherapy.     Wyatt Portela 4/16/20151:58 PM

## 2014-06-14 ENCOUNTER — Other Ambulatory Visit: Payer: Self-pay

## 2014-06-14 ENCOUNTER — Emergency Department (HOSPITAL_COMMUNITY): Payer: BC Managed Care – PPO

## 2014-06-14 ENCOUNTER — Encounter (HOSPITAL_COMMUNITY): Payer: Self-pay | Admitting: Physical Medicine and Rehabilitation

## 2014-06-14 ENCOUNTER — Emergency Department (HOSPITAL_COMMUNITY)
Admission: EM | Admit: 2014-06-14 | Discharge: 2014-06-14 | Disposition: A | Discharge status: Home / Self Care | Payer: BC Managed Care – PPO | Attending: Emergency Medicine | Admitting: Emergency Medicine

## 2014-06-14 DIAGNOSIS — Z87891 Personal history of nicotine dependence: Secondary | ICD-10-CM | POA: Insufficient documentation

## 2014-06-14 DIAGNOSIS — Z85048 Personal history of other malignant neoplasm of rectum, rectosigmoid junction, and anus: Secondary | ICD-10-CM | POA: Diagnosis not present

## 2014-06-14 DIAGNOSIS — M25552 Pain in left hip: Secondary | ICD-10-CM | POA: Insufficient documentation

## 2014-06-14 DIAGNOSIS — M25559 Pain in unspecified hip: Secondary | ICD-10-CM

## 2014-06-14 DIAGNOSIS — Z880 Allergy status to penicillin: Secondary | ICD-10-CM | POA: Insufficient documentation

## 2014-06-14 DIAGNOSIS — Z792 Long term (current) use of antibiotics: Secondary | ICD-10-CM | POA: Diagnosis not present

## 2014-06-14 DIAGNOSIS — R52 Pain, unspecified: Secondary | ICD-10-CM

## 2014-06-14 DIAGNOSIS — Z5189 Encounter for other specified aftercare: Secondary | ICD-10-CM | POA: Diagnosis not present

## 2014-06-14 DIAGNOSIS — Z8719 Personal history of other diseases of the digestive system: Secondary | ICD-10-CM | POA: Diagnosis not present

## 2014-06-14 DIAGNOSIS — Z8739 Personal history of other diseases of the musculoskeletal system and connective tissue: Secondary | ICD-10-CM | POA: Diagnosis not present

## 2014-06-14 DIAGNOSIS — Z79899 Other long term (current) drug therapy: Secondary | ICD-10-CM | POA: Diagnosis not present

## 2014-06-14 DIAGNOSIS — Z8639 Personal history of other endocrine, nutritional and metabolic disease: Secondary | ICD-10-CM | POA: Insufficient documentation

## 2014-06-14 MED ORDER — HYDROMORPHONE HCL 1 MG/ML IJ SOLN
1.0000 mg | Freq: Once | INTRAMUSCULAR | Status: AC
  Administered 2014-06-14: 1 mg via INTRAVENOUS
  Filled 2014-06-14: qty 1

## 2014-06-14 MED ORDER — PREDNISONE 20 MG PO TABS
60.0000 mg | ORAL_TABLET | Freq: Once | ORAL | Status: AC
  Administered 2014-06-14: 60 mg via ORAL
  Filled 2014-06-14: qty 3

## 2014-06-14 MED ORDER — PREDNISONE 20 MG PO TABS: 20.0000 mg | ORAL_TABLET | Freq: Two times a day (BID) | ORAL | Status: DC

## 2014-06-14 MED ORDER — ONDANSETRON HCL 4 MG/2ML IJ SOLN
4.0000 mg | Freq: Once | INTRAMUSCULAR | Status: AC
  Administered 2014-06-14: 4 mg via INTRAVENOUS
  Filled 2014-06-14: qty 2

## 2014-06-14 NOTE — ED Notes (Signed)
Pt presents to department for evaluation of L hip pain. Onset Saturday. Was seen by PCP, positive L hip fracture. 10/10 pain upon arrival to ED. Pt is alert and oriented x4.

## 2014-06-14 NOTE — ED Provider Notes (Signed)
CSN: [id number]     Arrival date & time 06/14/14  1211 History   First MD Initiated Contact with Patient 06/14/14 1228     Chief Complaint  Patient presents with  . Hip Injury     (Consider location/radiation/quality/duration/timing/severity/associated sxs/prior Treatment) HPI    Hip pain for several days, without trauma. Able to walk with a limp. No fever, back pain or paresthesias. No bowel or bladder incontinence. Pain worse with ambulation. HE was seen at an urgent care and told that he had a fracture.There are no other known modifying factors.  Past Medical History  Diagnosis Date  . Gout   . Hyperlipemia   . Hemorrhoid   . S/P radiation therapy 04/16/11 - 05/23/11  . Frequent bowel movements Noted 02/19/12  . Pulmonary nodules 02/19/12    small-stable  . Hepatic steatosis   . Lymphadenopathy, retroperitoneal Pet Scan 02/13/12    aortacaval space  . Status post chemotherapy 04/16/11 - 05/21/11    6 Cycles Oxaliplatin based therapy with 2 cycles of FOLFOX and 4 cycles of XELOX  . Depressed Noted 02/19/12    Dr. Jamse Arn  . Testicular atrophy     right  . ED (erectile dysfunction)   . History of radiation therapy 03/23/12-04/21/12    para aortic oligo mets  60gy  . Colorectal cancer dx'd 08/03/2010   Past Surgical History  Procedure Laterality Date  . Proctectomy  08/16/2010     low anterior resection,coloanal anastomosis and ileostomy  . Inguinal hernia repair      RIGHT  . Ileostomy      reversal 06/28/11  . Ileostomy reversal    . Cholecystectomy    . Portacath placement    . Sacral nerve stimulator placement  12/13/11    Decrease incontinence   Family History  Problem Relation Age of Onset  . Cancer Maternal Grandfather     throat  . Cancer Cousin     breast, maternal  . Cancer Maternal Grandmother     unknown primary  . Other Mother   . Other Father    History  Substance Use Topics  . Smoking status: Former Smoker -- 1.00 packs/day for 18 years    Types:  Cigarettes    Quit date: 10/09/1997  . Smokeless tobacco: Never Used  . Alcohol Use: Yes     Comment: occasionally    Review of Systems  All other systems reviewed and are negative.     Allergies  Penicillins  Home Medications   Prior to Admission medications   Medication Sig Start Date End Date Taking? Authorizing Provider  capecitabine (XELODA) 500 MG tablet Take 1,500 mg by mouth 2 (two) times daily after a meal. Take with water AFTER meal days 1-14. OFF days 15-21 11/15/13  Yes Historical Provider, MD  lipase/protease/amylase (CREON) 36000 UNITS CPEP capsule Take 108,000 Units by mouth 3 (three) times daily with meals.  06/09/14  Yes Historical Provider, MD  ondansetron (ZOFRAN) 8 MG tablet Take 8 mg by mouth 2 (two) times daily. Take 8 mg po BID  X 3  Days after chemo , and as needed for nausea. 06/19/12  Yes Lauretta I Odogwu, MD  oxyCODONE (OXY IR/ROXICODONE) 5 MG immediate release tablet Take 1 tablet (5 mg total) by mouth every 4 (four) hours as needed. For pain 10/21/13  Yes Wyatt Portela, MD  prochlorperazine (COMPAZINE) 10 MG tablet Take 10 mg by mouth every 6 (six) hours as needed. Nausea 03/24/12  Yes Lauretta I  Odogwu, MD  clindamycin (CLINDAGEL) 1 % gel Apply topically 2 (two) times daily. Apply to areas with rash. Patient not taking: Reported on 06/14/2014 06/26/12   Nira Retort, MD  predniSONE (DELTASONE) 20 MG tablet Take 1 tablet (20 mg total) by mouth 2 (two) times daily. 06/14/14   Richarda Blade, MD   BP 138/73 mmHg  Pulse 76  Temp(Src) 97.4 F (36.3 C) (Oral)  Resp 19  SpO2 99% Physical Exam  Constitutional: He is oriented to person, place, and time. He appears well-developed and well-nourished.  HENT:  Head: Normocephalic and atraumatic.  Right Ear: External ear normal.  Left Ear: External ear normal.  Eyes: Conjunctivae and EOM are normal. Pupils are equal, round, and reactive to light.  Neck: Normal range of motion and phonation normal. Neck  supple.  Cardiovascular: Normal rate, regular rhythm and normal heart sounds.   Pulmonary/Chest: Effort normal and breath sounds normal. He exhibits no bony tenderness.  Abdominal: Soft. There is no tenderness.  Musculoskeletal: Normal range of motion.  Tender left lateral hip; and pain with PROM left hip.  Neurological: He is alert and oriented to person, place, and time. No cranial nerve deficit or sensory deficit. He exhibits normal muscle tone. Coordination normal.  Skin: Skin is warm, dry and intact.  Psychiatric: He has a normal mood and affect. His behavior is normal. Judgment and thought content normal.  Nursing note and vitals reviewed.   ED Course  Procedures (including critical care time)  Attempted to read images on disc patient brought in. No data on disc.  Medications  HYDROmorphone (DILAUDID) injection 1 mg (1 mg Intravenous Given 06/14/14 1247)  ondansetron (ZOFRAN) injection 4 mg (4 mg Intravenous Given 06/14/14 1245)  HYDROmorphone (DILAUDID) injection 1 mg (1 mg Intravenous Given 06/14/14 1339)  predniSONE (DELTASONE) tablet 60 mg (60 mg Oral Given 06/14/14 1506)  HYDROmorphone (DILAUDID) injection 1 mg (1 mg Intravenous Given 06/14/14 1509)    Patient Vitals for the past 24 hrs:  BP Temp Temp src Pulse Resp SpO2  06/14/14 1531 138/73 mmHg - - 76 19 99 %  06/14/14 1430 143/78 mmHg - - - 18 -  06/14/14 1415 - - - 83 18 97 %  06/14/14 1330 139/94 mmHg - - 78 19 97 %  06/14/14 1315 145/89 mmHg - - 77 14 94 %  06/14/14 1300 155/85 mmHg - - 87 17 95 %  06/14/14 1219 146/86 mmHg 97.4 F (36.3 C) Oral 75 18 99 %    At D/C Reevaluation with update and discussion. After initial assessment and treatment, an updated evaluation reveals pain is better, no further c/o. Discussed with patient and wife.. [place] Review Labs Reviewed - No data to display  Imaging Review Dg Hip Complete Left  06/14/2014   CLINICAL DATA:  Left hip pain for 3 days without  injury.  EXAM: LEFT HIP - COMPLETE 2+ VIEW  COMPARISON:  None.  FINDINGS: There is no evidence of hip fracture or dislocation. There is no evidence of arthropathy or other focal bone abnormality.  IMPRESSION: Normal left hip.   Electronically Signed   By: Sabino Dick M.D.   On: 06/14/2014 13:55   Dg Outside Films Body  06/14/2014   This examination belongs to an outside facility and is stored  here for comparison purposes only.  Contact the originating outside  institution for any associated report or interpretation.    EKG Interpretation None  MDM   Final diagnoses:  Hip pain    Suspect brursitis. Doubt fracture.  Nursing Notes Reviewed/ Care Coordinated Applicable Imaging Reviewed Interpretation of Laboratory Data incorporated into ED treatment  The patient appears reasonably screened and/or stabilized for discharge and I doubt any other medical condition or other [facility] requiring further screening, evaluation, or treatment in the ED at this time prior to discharge.  Plan: Home Medications- Prednisone; Home Treatments- rest; return here if the recommended treatment, does not improve the symptoms; Recommended follow up- PCP prn    Richarda Blade, MD 06/14/14 2008

## 2014-06-14 NOTE — Discharge Instructions (Signed)
Use heat on the sore area 3 times a day. Avoid climbing or walking long distances.    Arthralgia Your caregiver has diagnosed you as suffering from an arthralgia. Arthralgia means there is pain in a joint. This can come from many reasons including:  Bruising the joint which causes soreness (inflammation) in the joint.  Wear and tear on the joints which occur as we grow older (osteoarthritis).  Overusing the joint.  Various forms of arthritis.  Infections of the joint. Regardless of the cause of pain in your joint, most of these different pains respond to anti-inflammatory drugs and rest. The exception to this is when a joint is infected, and these cases are treated with antibiotics, if it is a bacterial infection. HOME CARE INSTRUCTIONS   Rest the injured area for as long as directed by your caregiver. Then slowly start using the joint as directed by your caregiver and as the pain allows. Crutches as directed may be useful if the ankles, knees or hips are involved. If the knee was splinted or casted, continue use and care as directed. If an stretchy or elastic wrapping bandage has been applied today, it should be removed and re-applied every 3 to 4 hours. It should not be applied tightly, but firmly enough to keep swelling down. Watch toes and feet for swelling, bluish discoloration, coldness, numbness or excessive pain. If any of these problems (symptoms) occur, remove the ace bandage and re-apply more loosely. If these symptoms persist, contact your caregiver or return to this location.  For the first 24 hours, keep the injured extremity elevated on pillows while lying down.  Apply ice for 15-20 minutes to the sore joint every couple hours while awake for the first half day. Then 03-04 times per day for the first 48 hours. Put the ice in a plastic bag and place a towel between the bag of ice and your skin.  Wear any splinting, casting, elastic bandage applications, or slings as  instructed.  Only take over-the-counter or prescription medicines for pain, discomfort, or fever as directed by your caregiver. Do not use aspirin immediately after the injury unless instructed by your physician. Aspirin can cause increased bleeding and bruising of the tissues.  If you were given crutches, continue to use them as instructed and do not resume weight bearing on the sore joint until instructed. Persistent pain and inability to use the sore joint as directed for more than 2 to 3 days are warning signs indicating that you should see a caregiver for a follow-up visit as soon as possible. Initially, a hairline fracture (break in bone) may not be evident on X-rays. Persistent pain and swelling indicate that further evaluation, non-weight bearing or use of the joint (use of crutches or slings as instructed), or further X-rays are indicated. X-rays may sometimes not show a small fracture until a week or 10 days later. Make a follow-up appointment with your own caregiver or one to whom we have referred you. A radiologist (specialist in reading X-rays) may read your X-rays. Make sure you know how you are to obtain your X-ray results. Do not assume everything is normal if you do not hear from [place]. SEEK MEDICAL CARE IF: Bruising, swelling, or pain increases. SEEK IMMEDIATE MEDICAL CARE IF:   Your fingers or toes are numb or blue.  The pain is not responding to medications and continues to stay the same or get worse.  The pain in your joint becomes severe.  You develop a fever  over 102 F (38.9 C).  It becomes impossible to move or use the joint. MAKE SURE YOU:   Understand these instructions.  Will watch your condition.  Will get help right away if you are not doing well or get worse. Document Released: 06/24/2005 Document Revised: 09/16/2011 Document Reviewed: 02/10/2008 [facility] Patient Information 2015 [place], [place]. This information is not intended to replace advice given to you  by your health care provider. Make sure you discuss any questions you have with your health care provider.

## 2014-06-14 NOTE — ED Notes (Signed)
Patient transported to X-ray 

## 2016-03-25 ENCOUNTER — Emergency Department (HOSPITAL_COMMUNITY): Payer: BLUE CROSS/BLUE SHIELD

## 2016-03-25 ENCOUNTER — Inpatient Hospital Stay (HOSPITAL_COMMUNITY)
Admission: EM | Admit: 2016-03-25 | Discharge: 2016-03-27 | DRG: 948 | Disposition: A | Discharge status: Home / Self Care | Payer: BLUE CROSS/BLUE SHIELD | Attending: Family Medicine | Admitting: Family Medicine

## 2016-03-25 ENCOUNTER — Encounter (HOSPITAL_COMMUNITY): Payer: Self-pay | Admitting: Vascular Surgery

## 2016-03-25 DIAGNOSIS — Z880 Allergy status to penicillin: Secondary | ICD-10-CM | POA: Diagnosis not present

## 2016-03-25 DIAGNOSIS — Z85048 Personal history of other malignant neoplasm of rectum, rectosigmoid junction, and anus: Secondary | ICD-10-CM

## 2016-03-25 DIAGNOSIS — N50812 Left testicular pain: Secondary | ICD-10-CM | POA: Diagnosis not present

## 2016-03-25 DIAGNOSIS — N433 Hydrocele, unspecified: Secondary | ICD-10-CM | POA: Diagnosis present

## 2016-03-25 DIAGNOSIS — C786 Secondary malignant neoplasm of retroperitoneum and peritoneum: Secondary | ICD-10-CM | POA: Diagnosis present

## 2016-03-25 DIAGNOSIS — C7800 Secondary malignant neoplasm of unspecified lung: Secondary | ICD-10-CM | POA: Diagnosis present

## 2016-03-25 DIAGNOSIS — Z809 Family history of malignant neoplasm, unspecified: Secondary | ICD-10-CM | POA: Diagnosis not present

## 2016-03-25 DIAGNOSIS — Z91048 Other nonmedicinal substance allergy status: Secondary | ICD-10-CM

## 2016-03-25 DIAGNOSIS — C787 Secondary malignant neoplasm of liver and intrahepatic bile duct: Secondary | ICD-10-CM | POA: Diagnosis present

## 2016-03-25 DIAGNOSIS — Z978 Presence of other specified devices: Secondary | ICD-10-CM

## 2016-03-25 DIAGNOSIS — F419 Anxiety disorder, unspecified: Secondary | ICD-10-CM | POA: Diagnosis present

## 2016-03-25 DIAGNOSIS — C189 Malignant neoplasm of colon, unspecified: Secondary | ICD-10-CM | POA: Diagnosis not present

## 2016-03-25 DIAGNOSIS — Z923 Personal history of irradiation: Secondary | ICD-10-CM

## 2016-03-25 DIAGNOSIS — R109 Unspecified abdominal pain: Secondary | ICD-10-CM | POA: Diagnosis present

## 2016-03-25 DIAGNOSIS — Z888 Allergy status to other drugs, medicaments and biological substances status: Secondary | ICD-10-CM

## 2016-03-25 DIAGNOSIS — M8458XA Pathological fracture in neoplastic disease, other specified site, initial encounter for fracture: Secondary | ICD-10-CM | POA: Diagnosis present

## 2016-03-25 DIAGNOSIS — S32029A Unspecified fracture of second lumbar vertebra, initial encounter for closed fracture: Secondary | ICD-10-CM | POA: Diagnosis not present

## 2016-03-25 DIAGNOSIS — M8448XA Pathological fracture, other site, initial encounter for fracture: Secondary | ICD-10-CM | POA: Diagnosis not present

## 2016-03-25 DIAGNOSIS — Z9221 Personal history of antineoplastic chemotherapy: Secondary | ICD-10-CM

## 2016-03-25 DIAGNOSIS — Z87891 Personal history of nicotine dependence: Secondary | ICD-10-CM | POA: Diagnosis not present

## 2016-03-25 DIAGNOSIS — C7951 Secondary malignant neoplasm of bone: Secondary | ICD-10-CM | POA: Diagnosis present

## 2016-03-25 DIAGNOSIS — G893 Neoplasm related pain (acute) (chronic): Principal | ICD-10-CM | POA: Diagnosis present

## 2016-03-25 HISTORY — DX: Hydrocele, unspecified: N43.3

## 2016-03-25 LAB — COMPREHENSIVE METABOLIC PANEL
ALT: 27 U/L (ref 17–63)
AST: 35 U/L (ref 15–41)
Albumin: 4.4 g/dL (ref 3.5–5.0)
Alkaline Phosphatase: 108 U/L (ref 38–126)
Anion gap: 10 (ref 5–15)
BILIRUBIN TOTAL: 1.3 mg/dL — AB (ref 0.3–1.2)
BUN: 13 mg/dL (ref 6–20)
CALCIUM: 10.2 mg/dL (ref 8.9–10.3)
CO2: 25 mmol/L (ref 22–32)
CREATININE: 0.97 mg/dL (ref 0.61–1.24)
Chloride: 106 mmol/L (ref 101–111)
GFR calc Af Amer: >60 mL/min (ref >60)
Glucose, Bld: 101 mg/dL — ABNORMAL HIGH (ref 65–99)
POTASSIUM: 3.9 mmol/L (ref 3.5–5.1)
Sodium: 141 mmol/L (ref 135–145)
TOTAL PROTEIN: 7.9 g/dL (ref 6.5–8.1)

## 2016-03-25 LAB — URINALYSIS, ROUTINE W REFLEX MICROSCOPIC
Glucose, UA: NEGATIVE mg/dL
Hgb urine dipstick: NEGATIVE
KETONES UR: 15 mg/dL — AB
LEUKOCYTES UA: NEGATIVE
NITRITE: NEGATIVE
PROTEIN: NEGATIVE mg/dL
Specific Gravity, Urine: 1.025 (ref 1.005–1.030)
pH: 5 (ref 5.0–8.0)

## 2016-03-25 LAB — CBC
HCT: 48.6 % (ref 39.0–52.0)
Hemoglobin: 16.6 g/dL (ref 13.0–17.0)
MCH: 28.3 pg (ref 26.0–34.0)
MCHC: 34.2 g/dL (ref 30.0–36.0)
MCV: 82.8 fL (ref 78.0–100.0)
PLATELETS: 219 10*3/uL (ref 150–400)
RBC: 5.87 MIL/uL — ABNORMAL HIGH (ref 4.22–5.81)
RDW: 12.7 % (ref 11.5–15.5)
WBC: 10.9 10*3/uL — AB (ref 4.0–10.5)

## 2016-03-25 LAB — LIPASE, BLOOD: Lipase: 48 U/L (ref 11–51)

## 2016-03-25 LAB — I-STAT TROPONIN, ED: Troponin i, poc: 0 ng/mL (ref 0.00–0.08)

## 2016-03-25 MED ORDER — FENTANYL CITRATE (PF) 100 MCG/2ML IJ SOLN
INTRAMUSCULAR | Status: AC
  Administered 2016-03-25: 50 ug via INTRAVENOUS
  Filled 2016-03-25: qty 2

## 2016-03-25 MED ORDER — HYDROMORPHONE HCL 1 MG/ML IJ SOLN
0.5000 mg | INTRAMUSCULAR | Status: DC | PRN
  Administered 2016-03-26 (×2): 1 mg via INTRAVENOUS
  Filled 2016-03-25 (×2): qty 1

## 2016-03-25 MED ORDER — HYDROMORPHONE HCL 1 MG/ML IJ SOLN: 0.5000 mg | INTRAMUSCULAR | Status: DC | PRN

## 2016-03-25 MED ORDER — ONDANSETRON HCL 4 MG PO TABS: 8.0000 mg | ORAL_TABLET | Freq: Three times a day (TID) | ORAL | Status: DC | PRN

## 2016-03-25 MED ORDER — ONDANSETRON HCL 4 MG/2ML IJ SOLN
4.0000 mg | Freq: Once | INTRAMUSCULAR | Status: AC | PRN
  Administered 2016-03-25: 4 mg via INTRAVENOUS

## 2016-03-25 MED ORDER — LORAZEPAM 1 MG PO TABS
1.0000 mg | ORAL_TABLET | ORAL | Status: DC | PRN
  Administered 2016-03-26 – 2016-03-27 (×8): 1 mg via ORAL
  Filled 2016-03-25 (×9): qty 1

## 2016-03-25 MED ORDER — IOPAMIDOL (ISOVUE-300) INJECTION 61%
INTRAVENOUS | Status: AC
  Administered 2016-03-25: 100 mL
  Filled 2016-03-25: qty 100

## 2016-03-25 MED ORDER — DIPHENHYDRAMINE HCL 25 MG PO CAPS
25.0000 mg | ORAL_CAPSULE | Freq: Four times a day (QID) | ORAL | Status: DC | PRN
  Administered 2016-03-27: 25 mg via ORAL
  Filled 2016-03-25 (×2): qty 1

## 2016-03-25 MED ORDER — PROCHLORPERAZINE MALEATE 10 MG PO TABS
10.0000 mg | ORAL_TABLET | Freq: Four times a day (QID) | ORAL | Status: DC | PRN
  Filled 2016-03-25 (×2): qty 1

## 2016-03-25 MED ORDER — HYDROMORPHONE HCL 1 MG/ML IJ SOLN
1.0000 mg | Freq: Once | INTRAMUSCULAR | Status: AC
  Administered 2016-03-25: 1 mg via INTRAVENOUS
  Filled 2016-03-25: qty 1

## 2016-03-25 MED ORDER — FENTANYL CITRATE (PF) 100 MCG/2ML IJ SOLN
50.0000 ug | INTRAMUSCULAR | Status: DC | PRN
Start: 2016-03-25 — End: 2016-03-25
  Administered 2016-03-25: 50 ug via INTRAVENOUS

## 2016-03-25 MED ORDER — SODIUM CHLORIDE 0.9 % IV BOLUS (SEPSIS)
500.0000 mL | Freq: Once | INTRAVENOUS | Status: AC
  Administered 2016-03-25: 500 mL via INTRAVENOUS

## 2016-03-25 MED ORDER — KETAMINE HCL 10 MG/ML IJ SOLN: 0.3000 mg/kg | Freq: Once | INTRAMUSCULAR | Status: DC

## 2016-03-25 MED ORDER — KETAMINE HCL-SODIUM CHLORIDE 100-0.9 MG/10ML-% IV SOSY
25.0000 mg | PREFILLED_SYRINGE | Freq: Once | INTRAVENOUS | Status: AC
  Administered 2016-03-25: 25 mg via INTRAVENOUS
  Filled 2016-03-25: qty 10

## 2016-03-25 MED ORDER — HYDROMORPHONE HCL 1 MG/ML IJ SOLN
0.5000 mg | INTRAMUSCULAR | Status: DC | PRN
Start: 2016-03-25 — End: 2016-03-25
  Administered 2016-03-25: 0.5 mg via INTRAVENOUS
  Filled 2016-03-25: qty 1

## 2016-03-25 MED ORDER — HYDROMORPHONE HCL 1 MG/ML IJ SOLN
1.0000 mg | Freq: Once | INTRAMUSCULAR | Status: AC
Start: 2016-03-25 — End: 2016-03-25
  Administered 2016-03-25: 1 mg via INTRAVENOUS
  Filled 2016-03-25: qty 1

## 2016-03-25 MED ORDER — ONDANSETRON HCL 4 MG/2ML IJ SOLN
INTRAMUSCULAR | Status: AC
  Filled 2016-03-25: qty 2

## 2016-03-25 NOTE — Progress Notes (Signed)
Patient arrived from ED with family and belongings via stretcher.  Vitals stable, pt oriented to unit, questions answered. On call paged regarding pain medication. Will continue to monitor patient.

## 2016-03-25 NOTE — ED Notes (Signed)
Patient transported to Ultrasound 

## 2016-03-25 NOTE — ED Notes (Signed)
Pt given ice pack

## 2016-03-25 NOTE — ED Notes (Signed)
Patient transported to CT 

## 2016-03-25 NOTE — ED Notes (Signed)
Admitting at bedside 

## 2016-03-25 NOTE — ED Notes (Signed)
Dr.Knott at bedside. 

## 2016-03-25 NOTE — ED Provider Notes (Signed)
[place] DEPT Provider Note   CSN: [id number] Arrival date & time: 03/25/16  1355     History   Chief Complaint Chief Complaint  Patient presents with  . Abdominal Pain  . Chest Pain    HPI DAVARIAN LOHSE is a 50 y.o. male.  The history is provided by the patient.  Abdominal Pain   This is a chronic problem. Episode onset: 3 weeks or more. The problem occurs constantly. Progression since onset: slightly better for 2 days after hydrocele drainage and returned to same as prior. The pain is located in the LUQ, LLQ and perineum (left testicle). The pain is moderate. Associated symptoms include anorexia. Pertinent negatives include fever and hematochezia. The symptoms are aggravated by activity. Nothing relieves the symptoms.  Chest Pain   Associated symptoms include abdominal pain. Pertinent negatives include no fever.    Past Medical History:  Diagnosis Date  . Colorectal cancer ([place]) dx'd 08/03/2010  . Depressed Noted 02/19/12   Dr. Jamse Arn  . ED (erectile dysfunction)   . Frequent bowel movements Noted 02/19/12  . Gout   . Hemorrhoid   . Hepatic steatosis   . History of radiation therapy 03/23/12-04/21/12   para aortic oligo mets  60gy  . Hydrocele in adult   . Hyperlipemia   . Lymphadenopathy, retroperitoneal Pet Scan 02/13/12   aortacaval space  . Pulmonary nodules 02/19/12   small-stable  . S/P radiation therapy 04/16/11 - 05/23/11  . Status post chemotherapy 04/16/11 - 05/21/11   6 Cycles Oxaliplatin based therapy with 2 cycles of FOLFOX and 4 cycles of XELOX  . Testicular atrophy    right    Patient Active Problem List   Diagnosis Date Noted  . Stomatitis 06/24/2012  . Hypokalemia 06/24/2012  . Pancytopenia ([place]) 06/24/2012  . Acute diarrhea likely from viral gastroenteritis in the setting of immune suppression from prior chemotherapy 06/23/2012  . Orthostatic hypotension 06/23/2012  . Colorectal cancer ([place])   . ED (erectile dysfunction)   . Testicular  atrophy   . Depressed   . Status post chemotherapy   . Lymphadenopathy, retroperitoneal   . Hepatic steatosis   . Frequent bowel movements   . S/P radiation therapy   . Hemorrhoid   . Hyperlipemia   . 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 Pulmonary nodules 02/19/2012  . Rectal cancer ([place]) 04/27/2011  . CLOSED FRACTURE OF NECK OF METACARPAL BONE 12/20/2009    Past Surgical History:  Procedure Laterality Date  . CHOLECYSTECTOMY    . EYE SURGERY    . ILEOSTOMY     reversal 06/28/11  . ileostomy reversal    . INGUINAL HERNIA REPAIR     RIGHT  . PORTACATH PLACEMENT    . PROCTECTOMY  08/16/2010    low anterior resection,coloanal anastomosis and ileostomy  . SACRAL NERVE STIMULATOR PLACEMENT  12/13/11   Decrease incontinence       Home Medications    Prior to Admission medications   Medication Sig Start Date End Date Taking? Authorizing Provider  diphenhydrAMINE (BENADRYL) 25 mg capsule Take 25 mg by mouth every 6 (six) hours as needed for itching.    Yes Historical Provider, MD  LORazepam (ATIVAN) 1 MG tablet Take 1 mg by mouth every 4 (four) hours as needed. For sleep or anxiety 03/15/16  Yes Historical Provider, MD  ondansetron (ZOFRAN) 8 MG tablet Take 8 mg by mouth 2 (two) times daily. Take 8 mg po BID  X 3  Days after chemo , and as needed for nausea. 06/19/12  Yes Lauretta I Odogwu, MD  Oxycodone HCl 10 MG TABS Take 10 mg by mouth See admin instructions. Every two to three hours as needed for pain 03/15/16  Yes Historical Provider, MD  prochlorperazine (COMPAZINE) 10 MG tablet Take 10 mg by mouth every 6 (six) hours as needed. Nausea 03/24/12  Yes Lauretta I Odogwu, MD  clindamycin (CLINDAGEL) 1 % gel Apply topically 2 (two) times daily. Apply to areas with rash. Patient not taking: Reported on 03/25/2016 06/26/12   Nira Retort, MD  COTELLIC 20 MG tablet  [id number]   Historical Provider, MD  oxyCODONE (OXY IR/ROXICODONE) 5 MG immediate  release tablet Take 1 tablet (5 mg total) by mouth every 4 (four) hours as needed. For pain Patient not taking: Reported on 03/25/2016 10/21/13   Wyatt Portela, MD  predniSONE (DELTASONE) 20 MG tablet Take 1 tablet (20 mg total) by mouth 2 (two) times daily. Patient not taking: Reported on 03/25/2016 06/14/14   Daleen Bo, MD    Family History Family History  Problem Relation Age of Onset  . Other Mother   . Other Father   . Cancer Maternal Grandfather     throat  . Cancer Cousin     breast, maternal  . Cancer Maternal Grandmother     unknown primary    Social History Social History  Substance Use Topics  . Smoking status: Former Smoker    Packs/day: 1.00    Years: 18.00    Types: Cigarettes    Quit date: 10/09/1997  . Smokeless tobacco: Never Used  . Alcohol use Yes     Comment: occasionally     Allergies   Oxaliplatin; Penicillins; Zolpidem; and Tape   Review of Systems Review of Systems  Constitutional: Negative for fever.  Cardiovascular: Positive for chest pain.  Gastrointestinal: Positive for abdominal pain and anorexia. Negative for hematochezia.  All other systems reviewed and are negative.    Physical Exam Updated Vital Signs BP 125/86   Pulse 88   Temp 97 F (36.1 C) (Oral)   Resp 16   SpO2 100%   Physical Exam  Constitutional: He is oriented to person, place, and time. He appears well-developed and well-nourished. No distress.  HENT:  Head: Normocephalic and atraumatic.  Eyes: Conjunctivae are normal.  Neck: Neck supple. No tracheal deviation present.  Cardiovascular: Normal rate and regular rhythm.   Pulmonary/Chest: Effort normal. No respiratory distress.  Abdominal: Soft. He exhibits no distension and no mass. There is tenderness (L>R side). There is guarding (left sided).  Genitourinary: Right testis shows no swelling and no tenderness. Left testis shows swelling (fluid filled) and tenderness.  Neurological: He is alert and oriented to  person, place, and time.  Skin: Skin is warm and dry.  Psychiatric: He has a normal mood and affect.  Vitals reviewed.    ED Treatments / Results  Labs (all labs ordered are listed, but only abnormal results are displayed) Labs Reviewed  COMPREHENSIVE METABOLIC PANEL - Abnormal; Notable for the following:       Result Value   Glucose, Bld 101 (*)    Total Bilirubin 1.3 (*)    All other components within normal limits  CBC - Abnormal; Notable for the following:    WBC 10.9 (*)    RBC 5.87 (*)    All other components within normal limits  URINALYSIS, ROUTINE W REFLEX MICROSCOPIC (NOT AT [facility]) - Abnormal; Notable for  the following:    Color, Urine AMBER (*)    Bilirubin Urine SMALL (*)    Ketones, ur 15 (*)    All other components within normal limits  LIPASE, BLOOD  I-STAT TROPOININ, ED    EKG  EKG Interpretation  Date/Time:  Monday March 25 2016 14:03:45 EDT Ventricular Rate:  86 PR Interval:  130 QRS Duration: 88 QT Interval:  362 QTC Calculation: 433 R Axis:   68 Text Interpretation:  Normal sinus rhythm Normal ECG No significant change since last tracing Confirmed by Hamdi Vari MD, Alynah Schone [phone]) on 03/25/2016 3:10:22 PM       Radiology US Scrotum  Result Date: 03/25/2016 CLINICAL DATA:  Recurrent left scrotal pain and swelling. EXAM: SCROTAL ULTRASOUND DOPPLER ULTRASOUND OF THE TESTICLES TECHNIQUE: Complete ultrasound examination of the testicles, epididymis, and other scrotal structures was performed. Color and spectral Doppler ultrasound were also utilized to evaluate blood flow to the testicles. COMPARISON:  CT scan, same date. FINDINGS: Right testicle Measurements: 3.1 x 1.5 x 2.5 cm. Symmetric and homogeneous echotexture without focal lesion. Patent arterial and venous blood flow. Left testicle Measurements: 4.4 x 2.6 x 3.1 cm. Symmetric and homogeneous echotexture without focal lesion. Patent arterial and venous blood flow. Right epididymis:  Normal in size and  appearance. Left epididymis:  Normal in size and appearance. Hydrocele:  Moderate-sized complex hydrocele containing some debris. Varicocele:  Left-sided varicocele. Pulsed Doppler interrogation of both testes demonstrates normal low resistance arterial and venous waveforms bilaterally. IMPRESSION: 1. Normal sonographic appearance of both testicles. 2. Moderate left-sided complex hydrocele. 3. Left-sided varicocele. Electronically Signed   By: Marijo Sanes M.D.   On: 03/25/2016 19:08   Ct Abdomen Pelvis W Contrast  Result Date: 03/25/2016 CLINICAL DATA:  Metastatic rectal cancer. Abdominal pain, nausea and vomiting. Recurrent scrotal swelling status post drainage of hydrocele. EXAM: CT ABDOMEN AND PELVIS WITH CONTRAST TECHNIQUE: Multidetector CT imaging of the abdomen and pelvis was performed using the standard protocol following bolus administration of intravenous contrast. CONTRAST:  17mL ISOVUE-300 IOPAMIDOL (ISOVUE-300) INJECTION 61% COMPARISON:  10/19/2013 CT chest, abdomen and pelvis. FINDINGS: Lower chest: Numerous (greater than 20) irregular pulmonary nodules are seen throughout both lung bases, several of which are cavitary, which are significantly increased in size and number since 10/19/2013. Cavitary left lower lobe 2.4 cm nodule (series 3/image 8) is new. Irregular 2.7 cm right middle lobe nodule (series 3/ image 3) is increased from 1.2 cm. Hepatobiliary: Numerous hypodense liver masses (greater than 20) scattered throughout the liver are increased in size and number since 10/19/2013. For example a 5.1 cm far lateral segment left liver lobe mass (series 2/ image 14) is increased from 3.2 cm. Hypodense 1.9 cm posterior right liver dome (series 2/image 12) mass appears new. Cholecystectomy. Bile ducts are within expected post cholecystectomy limits and appear stable. Pancreas: Normal, with no mass or duct dilation. Spleen: Normal size. No mass. Adrenals/Urinary Tract: No discrete right adrenal  nodule. New hypodense 1.2 cm left adrenal nodule (series 2/ image 32). No hydronephrosis. Hypodense 0.3 cm renal cortical lesion in the posterior interpolar left kidney is too small to characterize and not definitely changed, suggesting a benign renal cyst. No new renal mass. Relatively collapsed bladder with diffuse bladder wall thickening, not appreciably changed. No bladder stones . Stomach/Bowel: Grossly normal stomach. Stable appearance of the enteroenterostomy in the distal small bowel. Normal caliber small bowel. No small bowel wall thickening. Normal appendix. No definite large bowel wall thickening accounting for the collapsed state.  Haziness of the presacral space fat, unchanged. No presacral fluid collections. Vascular/Lymphatic: Atherosclerotic nonaneurysmal abdominal aorta. Patent portal, splenic, hepatic and renal veins. There is soft tissue encasing the IVC and abdominal aorta, measuring up to the 1.1 cm thickness, largely new. Otherwise no abdominopelvic adenopathy. Reproductive: Mildly enlarged prostate. Other: No pneumoperitoneum, ascites or focal fluid collection. Small fat containing right inguinal hernia. Musculoskeletal: There is a new fracture of the inferior L2 vertebral body with associated central patchy sclerosis and 6 mm retropulsion of the posterior fracture fragment. Mild thoracolumbar spondylosis. No additional focal osseous lesions. Right posterior spinal stimulator traverses the right S3 neural foramen. IMPRESSION: 1. Bibasilar cavitary pulmonary metastases are significantly increased in size and number since the most recent comparison study of 10/19/2013. 2. Numerous liver metastases are significantly increased in size and number since 10/19/2013. 3. New infiltrative soft tissue encasing the IVC and abdominal aorta, most consistent with infiltrative confluent metastatic adenopathy. 4. New inferior L2 vertebral body fracture, which appears acute/subacute, with 6 mm retropulsion of a  posterior fracture fragment into the anterior lumbar spinal canal. This may represent a pathologic fracture given patchy sclerotic change in the L2 vertebral body. 5. 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 6. No evidence of bowel obstruction or acute bowel inflammation. Normal appendix . 7. Stable diffuse bladder wall thickening, which may represent chronic post treatment change. Recommend correlation with urinalysis to exclude acute cystitis . No hydronephrosis. 8. Additional findings include aortic atherosclerosis, small fat containing right inguinal hernia and mild prostatomegaly. Electronically Signed   By: Ilona Sorrel M.D.   On: 03/25/2016 16:50   [place] Art/ven Flow Abd Pelv Doppler  Result Date: 03/25/2016 CLINICAL DATA:  Recurrent left scrotal pain and swelling. EXAM: SCROTAL ULTRASOUND DOPPLER ULTRASOUND OF THE TESTICLES TECHNIQUE: Complete ultrasound examination of the testicles, epididymis, and other scrotal structures was performed. Color and spectral Doppler ultrasound were also utilized to evaluate blood flow to the testicles. COMPARISON:  CT scan, same date. FINDINGS: Right testicle Measurements: 3.1 x 1.5 x 2.5 cm. Symmetric and homogeneous echotexture without focal lesion. Patent arterial and venous blood flow. Left testicle Measurements: 4.4 x 2.6 x 3.1 cm. Symmetric and homogeneous echotexture without focal lesion. Patent arterial and venous blood flow. Right epididymis:  Normal in size and appearance. Left epididymis:  Normal in size and appearance. Hydrocele:  Moderate-sized complex hydrocele containing some debris. Varicocele:  Left-sided varicocele. Pulsed Doppler interrogation of both testes demonstrates normal low resistance arterial and venous waveforms bilaterally. IMPRESSION: 1. Normal sonographic appearance of both testicles. 2. Moderate left-sided complex hydrocele. 3. Left-sided varicocele. Electronically Signed   By: Marijo Sanes M.D.   On: 03/25/2016 19:08     Procedures Procedures (including critical care time)  Medications Ordered in ED Medications  prochlorperazine (COMPAZINE) tablet 10 mg (not administered)  ondansetron (ZOFRAN) tablet 8 mg (not administered)  LORazepam (ATIVAN) tablet 1 mg (not administered)  diphenhydrAMINE (BENADRYL) capsule 25 mg (not administered)  HYDROmorphone (DILAUDID) injection 0.5-1 mg (not administered)  ondansetron (ZOFRAN) injection 4 mg (4 mg Intravenous Given 03/25/16 1420)  HYDROmorphone (DILAUDID) injection 1 mg (1 mg Intravenous Given 03/25/16 1519)  iopamidol (ISOVUE-300) 61 % injection (100 mLs  Contrast Given 03/25/16 1602)  sodium chloride 0.9 % bolus 500 mL (0 mLs Intravenous Stopped 03/25/16 1904)  ketamine 100 mg in normal saline 10 mL (10mg /mL) syringe (25 mg Intravenous Given 03/25/16 1803)  HYDROmorphone (DILAUDID) injection 1 mg (1 mg Intravenous Given 03/25/16 2014)  HYDROmorphone (DILAUDID) injection  1 mg (1 mg Intravenous Given 03/25/16 2245)     Initial Impression / Assessment and Plan / ED Course  I have reviewed the triage vital signs and the nursing notes.  Pertinent labs & imaging results that were available during my care of the patient were reviewed by me and considered in my medical decision making (see chart for details).  Clinical Course    50 y.o. male presents with Ongoing abdominal pain for greater than 3 weeks. He has been seen by his urologist to drain the left-sided hydrocele that he thought was the cause of his symptoms. He had a brief improvement in his pain symptoms and abdominal fullness after the drainage but fluid has since accumulated causing him increased discomfort. Patient has history of widely metastatic recurrent colon cancer and has completed multiple rounds of radiation and chemotherapy. Given the onset of his symptoms and last CT greater than 5 weeks ago, there is concern for mass effect causing the reaccumulation of fluid with increased soft tissue density  surrounding the aorta on his last CT. Pain controlled here with IV Dilaudid. Patient has not had any care or establish with pain management or palliative care which I suggested after this visit. Labs unremarkable.  CT with new finding of lumbar pathologic fracture. Discussed with o/c neurosurgeon Dr Annette Stable who recommends contrasted MR to evaluate lesion further. Unable to undergo MRI until spinal stimulator can be turned off, Pt's wife went home to obtain manufacturer information and control device. Pt with high pain control requirements refractory to multiple doses of dilaudid, attempted subdissociative dose of ketamine with minimal improvement of pain. Hospitalist called to help with pain management of likely pain relating to metastatic disease and radiation changes, testicular ultrasound without torsion or other complicating feature.   Final Clinical Impressions(s) / ED Diagnoses   Final diagnoses:  Pain in left testicle  Pathologic fracture of lumbar vertebra, initial encounter  Fracture of L2 vertebra, closed, initial encounter    New Prescriptions New Prescriptions   No medications on file     Leo Grosser, MD 03/26/16 0107

## 2016-03-25 NOTE — ED Triage Notes (Signed)
Pt reports to the ED for eval of abd pain, testicle pain, and CP. Pt has a hx of hydrocele had some fluid drained off to help with the symptoms but that only helped for one day then it reoccured. Pt has some associated abd pain, N/V, CP, and recurrent testicle swelling. Patient un able to sit still. Pt has advanced CA.

## 2016-03-25 NOTE — H&P (Signed)
History and Physical    [name] [id number] DOB: Sep 05, 1965 DOA: 03/25/2016   PCP: Stephens Shire, MD Chief Complaint:  Chief Complaint  Patient presents with  . Abdominal Pain  . Chest Pain    HPI: [name] is a 50 y.o. male with medical history significant of metastatic colon cancer.  Patient presents to ED with c/o uncontrolled abd pain and testicle pain.  Patient has a hydrocele and had some fluid drained off a couple of days ago at [facility] to help with symptoms "just to prove to him that draining the hydrocele wouldn't really help".  It provided slight relief for a couple of days before recurring with severe pain.  At home he is only taking oxy IR 10.  Was taking as needed but now takes essentially all the time.   ED Course: Pain controlled with dilaudid in ED.  During work up he is also found to have pathologic L2 fracture.  In addition to widely metastatic disease.   Review of Systems: As per HPI otherwise 10 point review of systems negative.    Past Medical History:  Diagnosis Date  . Colorectal cancer ([place]) dx'd 08/03/2010  . Depressed Noted 02/19/12   Dr. Jamse Arn  . ED (erectile dysfunction)   . Frequent bowel movements Noted 02/19/12  . Gout   . Hemorrhoid   . Hepatic steatosis   . History of radiation therapy 03/23/12-04/21/12   para aortic oligo mets  60gy  . Hydrocele in adult   . Hyperlipemia   . Lymphadenopathy, retroperitoneal Pet Scan 02/13/12   aortacaval space  . Pulmonary nodules 02/19/12   small-stable  . S/P radiation therapy 04/16/11 - 05/23/11  . Status post chemotherapy 04/16/11 - 05/21/11   6 Cycles Oxaliplatin based therapy with 2 cycles of FOLFOX and 4 cycles of XELOX  . Testicular atrophy    right    Past Surgical History:  Procedure Laterality Date  . CHOLECYSTECTOMY    . EYE SURGERY    . ILEOSTOMY     reversal 06/28/11  . ileostomy reversal    . INGUINAL HERNIA REPAIR     RIGHT  . PORTACATH PLACEMENT    . PROCTECTOMY   08/16/2010    low anterior resection,coloanal anastomosis and ileostomy  . SACRAL NERVE STIMULATOR PLACEMENT  12/13/11   Decrease incontinence     reports that he quit smoking about 18 years ago. His smoking use included Cigarettes. He has a 18.00 pack-year smoking history. He has never used smokeless tobacco. He reports that he drinks alcohol. He reports that he uses drugs, including Marijuana.  Allergies  Allergen Reactions  . Oxaliplatin Other (See Comments)    Flushing, throat tightness  . Penicillins Itching    Intermittent reaction per pt Has patient had a PCN reaction causing immediate rash, facial/tongue/throat swelling, SOB or lightheadedness with hypotension: Yes Has patient had a PCN reaction causing severe rash involving mucus membranes or skin necrosis: No Has patient had a PCN reaction that required hospitalization No Has patient had a PCN reaction occurring within the last 10 years: No If all of the above answers are "NO", then may proceed with Cephalosporin use.   Marland Kitchen Zolpidem Other (See Comments)    Mental status change and nightmares  . Tape Rash    Family History  Problem Relation Age of Onset  . Other Mother   . Other Father   . Cancer Maternal Grandfather     throat  . Cancer Cousin  breast, maternal  . Cancer Maternal Grandmother     unknown primary      Prior to Admission medications   Medication Sig Start Date End Date Taking? Authorizing Provider  diphenhydrAMINE (BENADRYL) 25 mg capsule Take 25 mg by mouth every 6 (six) hours as needed for itching.    Yes Historical Provider, MD  LORazepam (ATIVAN) 1 MG tablet Take 1 mg by mouth every 4 (four) hours as needed. For sleep or anxiety 03/15/16  Yes Historical Provider, MD  ondansetron (ZOFRAN) 8 MG tablet Take 8 mg by mouth See admin instructions. Take 8 mg by mouth twice a day  X 3  days after chemo and as needed for nausea 06/19/12  Yes Lauretta I Odogwu, MD  Oxycodone HCl 10 MG TABS Take 10 mg by mouth  See admin instructions. Every two to three hours as needed for pain 03/15/16  Yes Historical Provider, MD  prochlorperazine (COMPAZINE) 10 MG tablet Take 10 mg by mouth every 6 (six) hours as needed. Nausea 03/24/12  Yes Nira Retort, MD  COTELLIC 20 MG tablet  [id number]   Historical Provider, MD  oxyCODONE (OXY IR/ROXICODONE) 5 MG immediate release tablet Take 1 tablet (5 mg total) by mouth every 4 (four) hours as needed. For pain Patient not taking: Reported on 03/25/2016 10/21/13   Wyatt Portela, MD    Physical Exam: Vitals:   03/25/16 1834 03/25/16 1836 03/25/16 1915 03/25/16 1942  BP:   150/81 149/76  Pulse: 81 82 76 78  Resp: 19 17 17 13   Temp:      TempSrc:      SpO2: 100% 100% 100% 97%      Constitutional: NAD, calm, comfortable Eyes: PERRL, lids and conjunctivae normal ENMT: Mucous membranes are moist. Posterior pharynx clear of any exudate or lesions.Normal dentition.  Neck: normal, supple, no masses, no thyromegaly Respiratory: clear to auscultation bilaterally, no wheezing, no crackles. Normal respiratory effort. No accessory muscle use.  Cardiovascular: Regular rate and rhythm, no murmurs / rubs / gallops. No extremity edema. 2+ pedal pulses. No carotid bruits.  Abdomen: no tenderness, no masses palpated. No hepatosplenomegaly. Bowel sounds positive.  Musculoskeletal: no clubbing / cyanosis. No joint deformity upper and lower extremities. Good ROM, no contractures. Normal muscle tone.  Skin: no rashes, lesions, ulcers. No induration Neurologic: CN 2-12 grossly intact. Sensation intact, DTR normal. Strength 5/5 in all 4.  Psychiatric: Normal judgment and insight. Alert and oriented x 3. Normal mood.    Labs on Admission: I have personally reviewed following labs and imaging studies  CBC:  Recent Labs Lab 03/25/16 1421  WBC 10.9*  HGB 16.6  HCT 48.6  MCV 82.8  PLT [id number]   Basic Metabolic Panel:  Recent Labs Lab 03/25/16 1421  NA 141  K 3.9  CL 106  CO2 25   GLUCOSE 101*  BUN 13  CREATININE 0.97  CALCIUM 10.2   GFR: CrCl cannot be calculated (Unknown ideal weight.). Liver Function Tests:  Recent Labs Lab 03/25/16 1421  AST 35  ALT 27  ALKPHOS 108  BILITOT 1.3*  PROT 7.9  ALBUMIN 4.4    Recent Labs Lab 03/25/16 1421  LIPASE 48   No results for input(s): AMMONIA in the last 168 hours. Coagulation Profile: No results for input(s): INR, PROTIME in the last 168 hours. Cardiac Enzymes: No results for input(s): CKTOTAL, CKMB, CKMBINDEX, TROPONINI in the last 168 hours. BNP (last 3 results) No results for input(s): PROBNP in the last 8760  hours. HbA1C: No results for input(s): HGBA1C in the last 72 hours. CBG: No results for input(s): GLUCAP in the last 168 hours. Lipid Profile: No results for input(s): CHOL, HDL, LDLCALC, TRIG, CHOLHDL, LDLDIRECT in the last 72 hours. Thyroid Function Tests: No results for input(s): TSH, T4TOTAL, FREET4, T3FREE, THYROIDAB in the last 72 hours. Anemia Panel: No results for input(s): VITAMINB12, FOLATE, FERRITIN, TIBC, IRON, RETICCTPCT in the last 72 hours. Urine analysis:    Component Value Date/Time   COLORURINE AMBER (A) 03/25/2016 1611   APPEARANCEUR CLEAR 03/25/2016 1611   LABSPEC 1.025 03/25/2016 1611   PHURINE 5.0 03/25/2016 1611   GLUCOSEU NEGATIVE 03/25/2016 1611   HGBUR NEGATIVE 03/25/2016 1611   BILIRUBINUR SMALL (A) 03/25/2016 1611   KETONESUR 15 (A) 03/25/2016 1611   PROTEINUR NEGATIVE 03/25/2016 1611   UROBILINOGEN 0.2 06/23/2012 1823   NITRITE NEGATIVE 03/25/2016 1611   LEUKOCYTESUR NEGATIVE 03/25/2016 1611   Sepsis Labs: @LABRCNTIP (procalcitonin:4,lacticidven:4) )No results found for this or any previous visit (from the past 240 hour(s)).   Radiological Exams on Admission: US Scrotum  Result Date: 03/25/2016 CLINICAL DATA:  Recurrent left scrotal pain and swelling. EXAM: SCROTAL ULTRASOUND DOPPLER ULTRASOUND OF THE TESTICLES TECHNIQUE: Complete ultrasound  examination of the testicles, epididymis, and other scrotal structures was performed. Color and spectral Doppler ultrasound were also utilized to evaluate blood flow to the testicles. COMPARISON:  CT scan, same date. FINDINGS: Right testicle Measurements: 3.1 x 1.5 x 2.5 cm. Symmetric and homogeneous echotexture without focal lesion. Patent arterial and venous blood flow. Left testicle Measurements: 4.4 x 2.6 x 3.1 cm. Symmetric and homogeneous echotexture without focal lesion. Patent arterial and venous blood flow. Right epididymis:  Normal in size and appearance. Left epididymis:  Normal in size and appearance. Hydrocele:  Moderate-sized complex hydrocele containing some debris. Varicocele:  Left-sided varicocele. Pulsed Doppler interrogation of both testes demonstrates normal low resistance arterial and venous waveforms bilaterally. IMPRESSION: 1. Normal sonographic appearance of both testicles. 2. Moderate left-sided complex hydrocele. 3. Left-sided varicocele. Electronically Signed   By: Marijo Sanes M.D.   On: 03/25/2016 19:08   Ct Abdomen Pelvis W Contrast  Result Date: 03/25/2016 CLINICAL DATA:  Metastatic rectal cancer. Abdominal pain, nausea and vomiting. Recurrent scrotal swelling status post drainage of hydrocele. EXAM: CT ABDOMEN AND PELVIS WITH CONTRAST TECHNIQUE: Multidetector CT imaging of the abdomen and pelvis was performed using the standard protocol following bolus administration of intravenous contrast. CONTRAST:  131mL ISOVUE-300 IOPAMIDOL (ISOVUE-300) INJECTION 61% COMPARISON:  10/19/2013 CT chest, abdomen and pelvis. FINDINGS: Lower chest: Numerous (greater than 20) irregular pulmonary nodules are seen throughout both lung bases, several of which are cavitary, which are significantly increased in size and number since 10/19/2013. Cavitary left lower lobe 2.4 cm nodule (series 3/image 8) is new. Irregular 2.7 cm right middle lobe nodule (series 3/ image 3) is increased from 1.2 cm.  Hepatobiliary: Numerous hypodense liver masses (greater than 20) scattered throughout the liver are increased in size and number since 10/19/2013. For example a 5.1 cm far lateral segment left liver lobe mass (series 2/ image 14) is increased from 3.2 cm. Hypodense 1.9 cm posterior right liver dome (series 2/image 12) mass appears new. Cholecystectomy. Bile ducts are within expected post cholecystectomy limits and appear stable. Pancreas: Normal, with no mass or duct dilation. Spleen: Normal size. No mass. Adrenals/Urinary Tract: No discrete right adrenal nodule. New hypodense 1.2 cm left adrenal nodule (series 2/ image 32). No hydronephrosis. Hypodense 0.3 cm renal cortical lesion in the  posterior interpolar left kidney is too small to characterize and not definitely changed, suggesting a benign renal cyst. No new renal mass. Relatively collapsed bladder with diffuse bladder wall thickening, not appreciably changed. No bladder stones . Stomach/Bowel: Grossly normal stomach. Stable appearance of the enteroenterostomy in the distal small bowel. Normal caliber small bowel. No small bowel wall thickening. Normal appendix. No definite large bowel wall thickening accounting for the collapsed state. Haziness of the presacral space fat, unchanged. No presacral fluid collections. Vascular/Lymphatic: Atherosclerotic nonaneurysmal abdominal aorta. Patent portal, splenic, hepatic and renal veins. There is soft tissue encasing the IVC and abdominal aorta, measuring up to the 1.1 cm thickness, largely new. Otherwise no abdominopelvic adenopathy. Reproductive: Mildly enlarged prostate. Other: No pneumoperitoneum, ascites or focal fluid collection. Small fat containing right inguinal hernia. Musculoskeletal: There is a new fracture of the inferior L2 vertebral body with associated central patchy sclerosis and 6 mm retropulsion of the posterior fracture fragment. Mild thoracolumbar spondylosis. No additional focal osseous lesions.  Right posterior spinal stimulator traverses the right S3 neural foramen. IMPRESSION: 1. Bibasilar cavitary pulmonary metastases are significantly increased in size and number since the most recent comparison study of 10/19/2013. 2. Numerous liver metastases are significantly increased in size and number since 10/19/2013. 3. New infiltrative soft tissue encasing the IVC and abdominal aorta, most consistent with infiltrative confluent metastatic adenopathy. 4. New inferior L2 vertebral body fracture, which appears acute/subacute, with 6 mm retropulsion of a posterior fracture fragment into the anterior lumbar spinal canal. This may represent a pathologic fracture given patchy sclerotic change in the L2 vertebral body. 5. 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 6. No evidence of bowel obstruction or acute bowel inflammation. Normal appendix . 7. Stable diffuse bladder wall thickening, which may represent chronic post treatment change. Recommend correlation with urinalysis to exclude acute cystitis . No hydronephrosis. 8. Additional findings include aortic atherosclerosis, small fat containing right inguinal hernia and mild prostatomegaly. Electronically Signed   By: Ilona Sorrel M.D.   On: 03/25/2016 16:50   [place] Art/ven Flow Abd Pelv Doppler  Result Date: 03/25/2016 CLINICAL DATA:  Recurrent left scrotal pain and swelling. EXAM: SCROTAL ULTRASOUND DOPPLER ULTRASOUND OF THE TESTICLES TECHNIQUE: Complete ultrasound examination of the testicles, epididymis, and other scrotal structures was performed. Color and spectral Doppler ultrasound were also utilized to evaluate blood flow to the testicles. COMPARISON:  CT scan, same date. FINDINGS: Right testicle Measurements: 3.1 x 1.5 x 2.5 cm. Symmetric and homogeneous echotexture without focal lesion. Patent arterial and venous blood flow. Left testicle Measurements: 4.4 x 2.6 x 3.1 cm. Symmetric and homogeneous echotexture without focal lesion. Patent  arterial and venous blood flow. Right epididymis:  Normal in size and appearance. Left epididymis:  Normal in size and appearance. Hydrocele:  Moderate-sized complex hydrocele containing some debris. Varicocele:  Left-sided varicocele. Pulsed Doppler interrogation of both testes demonstrates normal low resistance arterial and venous waveforms bilaterally. IMPRESSION: 1. Normal sonographic appearance of both testicles. 2. Moderate left-sided complex hydrocele. 3. Left-sided varicocele. Electronically Signed   By: Marijo Sanes M.D.   On: 03/25/2016 19:08    EKG: Independently reviewed.  Assessment/Plan Principal Problem:   Pathologic fracture of lumbar vertebra Active Problems:   Metastatic colon cancer to liver ([place])    1. Pathologic L2 fracture - 1. Wife getting nerve stimulator controller to turn this off so he can get MRI (has had MRI brain since it was put in with it being turned off at the  time).  MRI requested per neuro surg. 2. Metastatic colon cancer - 1. Pain control with dilaudid for the moment 2. Warrants pal care consult in AM due to uncontrolled cancer pain.  Unfortunately it is not surprising to me that his pain is uncontrolled given his fairly heavy disease burden (that is now causing hydrocele's pathologic vertebral fractures, etc) that oxy IR 10 alone is insufficient to control pain.  Probably needs a long acting and break through.   DVT prophylaxis: Lovenox Code Status: Full for now Family Communication: Family at bedside Consults called: NS called by EDP, call pal care in AM Admission status: Admit to inpatient   Etta Quill DO [facility] Pager [phone] from 7PM-7AM  If 7AM-7PM, please contact the day physician for the patient [email or web address] Password [email or web address]  03/25/2016, 8:37 PM

## 2016-03-26 ENCOUNTER — Encounter (HOSPITAL_COMMUNITY): Payer: Self-pay | Admitting: Radiology

## 2016-03-26 ENCOUNTER — Inpatient Hospital Stay (HOSPITAL_COMMUNITY): Payer: BLUE CROSS/BLUE SHIELD

## 2016-03-26 HISTORY — PX: SACRAL NERVE STIMULATOR PLACEMENT: SHX2367

## 2016-03-26 MED ORDER — SENNA 8.6 MG PO TABS
2.0000 | ORAL_TABLET | Freq: Every day | ORAL | Status: DC
  Administered 2016-03-26 – 2016-03-27 (×2): 17.2 mg via ORAL
  Filled 2016-03-26 (×2): qty 2

## 2016-03-26 MED ORDER — HYDROMORPHONE HCL 1 MG/ML IJ SOLN
2.0000 mg | INTRAMUSCULAR | Status: DC | PRN
  Administered 2016-03-26 – 2016-03-27 (×4): 2 mg via INTRAVENOUS
  Filled 2016-03-26 (×4): qty 2

## 2016-03-26 MED ORDER — POLYETHYLENE GLYCOL 3350 17 G PO PACK
17.0000 g | PACK | Freq: Every day | ORAL | Status: DC
  Administered 2016-03-26 – 2016-03-27 (×2): 17 g via ORAL
  Filled 2016-03-26 (×2): qty 1

## 2016-03-26 MED ORDER — HEPARIN SODIUM (PORCINE) 5000 UNIT/ML IJ SOLN: 5000.0000 [IU] | Freq: Three times a day (TID) | INTRAMUSCULAR | Status: DC

## 2016-03-26 MED ORDER — HYDROMORPHONE HCL 1 MG/ML IJ SOLN
2.0000 mg | INTRAMUSCULAR | Status: DC | PRN
  Administered 2016-03-26 (×3): 2 mg via INTRAVENOUS
  Filled 2016-03-26 (×3): qty 2

## 2016-03-26 MED ORDER — OXYCODONE HCL ER 10 MG PO T12A
20.0000 mg | EXTENDED_RELEASE_TABLET | Freq: Two times a day (BID) | ORAL | Status: DC
  Administered 2016-03-26 – 2016-03-27 (×2): 20 mg via ORAL
  Filled 2016-03-26 (×2): qty 2

## 2016-03-26 MED ORDER — SODIUM CHLORIDE 0.9% FLUSH: 10.0000 mL | INTRAVENOUS | Status: DC | PRN

## 2016-03-26 MED ORDER — SODIUM CHLORIDE 0.9% FLUSH
10.0000 mL | Freq: Two times a day (BID) | INTRAVENOUS | Status: DC
  Administered 2016-03-26 – 2016-03-27 (×2): 10 mL

## 2016-03-26 MED ORDER — OXYCODONE HCL 5 MG PO TABS
20.0000 mg | ORAL_TABLET | ORAL | Status: DC | PRN
  Administered 2016-03-26 – 2016-03-27 (×4): 20 mg via ORAL
  Filled 2016-03-26 (×4): qty 4

## 2016-03-26 NOTE — Care Management Note (Signed)
Case Management Note  Patient Details  Name: [name] MRN: [id number] Date of Birth: April 13, 1966  Subjective/Objective:         Pt in with pathologic fracture of lumbar vertebra and pain. Pt with history of colon Ca with mets. He is from home with his spouse.           Action/Plan: Plan is to discharge home when medically ready. CM following for d/c needs.   Expected Discharge Date:                  Expected Discharge Plan:  Home/Self Care  In-House Referral:     Discharge planning Services     Post Acute Care Choice:    Choice offered to:     DME Arranged:    DME Agency:     HH Arranged:    HH Agency:     Status of Service:  In process, will continue to follow  If discussed at Long Length of Stay Meetings, dates discussed:    Additional Comments:  Pollie Friar, RN 03/26/2016, 6:41 PM

## 2016-03-26 NOTE — Progress Notes (Signed)
Patient with Port a cath. IV team consult ordered.

## 2016-03-26 NOTE — Progress Notes (Addendum)
PROGRESS NOTE  [name]  [id number] DOB: 1966-05-30 DOA: 03/25/2016 PCP: Stephens Shire, MD Outpatient Specialists:  Subjective: History complaining about severe pain, only controlled with 2 mg of IV Dilaudid. Pain is periumbilical to left lower quadrant.  Brief Narrative:  [name] is a 50 y.o. male with medical history significant of metastatic colon cancer.  Patient presents to ED with c/o uncontrolled abd pain and testicle pain.  Patient has a hydrocele and had some fluid drained off a couple of days ago at [facility] to help with symptoms "just to prove to him that draining the hydrocele wouldn't really help".  It provided slight relief for a couple of days before recurring with severe pain.  At home he is only taking oxy IR 10.  Was taking as needed but now takes essentially all the time.  Assessment & Plan:   Principal Problem:   Pathologic fracture of lumbar vertebra Active Problems:   Metastatic colon cancer to liver (HCC)   Acute on chronic pain, cancer-related -Metastatic colon cancer with known metastases to the liver, lungs and mesentery. -Came in with intractable abdominal pain, reportedly was not taking a lot of pain medications prior to this. -On oxycodone 10 mg, was not able to control pain with that. -Palliative medicine for symptoms management. -Discussed with Dr. Pia Mau, his oncologist at [facility]. -He recommended to start OxyContin 20 mg twice a day and increase OxyIR to 20 mg as needed. -We will monitor overnight, if pain better can be discharged to follow-up with him as soon as possible.  Pathologic L2 fracture -This is likely secondary to bone metastases, has another sclerotic lesion. -MRI couldn't be done because of spinal stimulator. -Initial recommendation to get radiation oncology to follow him up, but patient reported no significant back pain.  Metastatic lung cancer  -As mentioned above, metastases to the liver, lungs and mesentery. -Patient  follows with [facility] oncology.   DVT prophylaxis: Subcutaneous heparin Code Status: Prior Family Communication:  Disposition Plan:  Diet: Diet Heart Room service appropriate? Yes; Fluid consistency: Thin  Consultants:   None  Procedures:   none  Antimicrobials:   None  Objective: Vitals:   03/25/16 2155 03/26/16 0142 03/26/16 0555 03/26/16 0952  BP: (!) 160/95 (!) 160/76 (!) 150/74 (!) 143/78  Pulse: 78 73 77 75  Resp: 16 16 16 20   Temp: 98.8 F (37.1 C) 98.4 F (36.9 C) 98.8 F (37.1 C) 97.9 F (36.6 C)  TempSrc: Oral Oral Oral Oral  SpO2: 100% 97% 95% 98%  Weight: 83.1 kg (183 lb 3.2 oz)     Height: 5\' 11"  (1.803 m)       Intake/Output Summary (Last 24 hours) at 03/26/16 1216 Last data filed at 03/26/16 1214  Gross per 24 hour  Intake              980 ml  Output                0 ml  Net              980 ml   Filed Weights   03/25/16 2155  Weight: 83.1 kg (183 lb 3.2 oz)    Examination: General exam: Appears calm and comfortable  Respiratory system: Clear to auscultation. Respiratory effort normal. Cardiovascular system: S1 & S2 heard, RRR. No JVD, murmurs, rubs, gallops or clicks. No pedal edema. Gastrointestinal system: Abdomen is nondistended, soft and nontender. No organomegaly or masses felt. Normal bowel sounds heard. Central nervous system:  Alert and oriented. No focal neurological deficits. Extremities: Symmetric 5 x 5 power. Skin: No rashes, lesions or ulcers Psychiatry: Judgement and insight appear normal. Mood & affect appropriate.   Data Reviewed: I have personally reviewed following labs and imaging studies  CBC:  Recent Labs Lab 03/25/16 1421  WBC 10.9*  HGB 16.6  HCT 48.6  MCV 82.8  PLT [id number]   Basic Metabolic Panel:  Recent Labs Lab 03/25/16 1421  NA 141  K 3.9  CL 106  CO2 25  GLUCOSE 101*  BUN 13  CREATININE 0.97  CALCIUM 10.2   GFR: Estimated Creatinine Clearance (by C-G formula based on SCr of 0.97 mg/dL) Male:  75.8 mL/min Male: 94.9 mL/min Liver Function Tests:  Recent Labs Lab 03/25/16 1421  AST 35  ALT 27  ALKPHOS 108  BILITOT 1.3*  PROT 7.9  ALBUMIN 4.4    Recent Labs Lab 03/25/16 1421  LIPASE 48   No results for input(s): AMMONIA in the last 168 hours. Coagulation Profile: No results for input(s): INR, PROTIME in the last 168 hours. Cardiac Enzymes: No results for input(s): CKTOTAL, CKMB, CKMBINDEX, TROPONINI in the last 168 hours. BNP (last 3 results) No results for input(s): PROBNP in the last 8760 hours. HbA1C: No results for input(s): HGBA1C in the last 72 hours. CBG: No results for input(s): GLUCAP in the last 168 hours. Lipid Profile: No results for input(s): CHOL, HDL, LDLCALC, TRIG, CHOLHDL, LDLDIRECT in the last 72 hours. Thyroid Function Tests: No results for input(s): TSH, T4TOTAL, FREET4, T3FREE, THYROIDAB in the last 72 hours. Anemia Panel: No results for input(s): VITAMINB12, FOLATE, FERRITIN, TIBC, IRON, RETICCTPCT in the last 72 hours. Urine analysis:    Component Value Date/Time   COLORURINE AMBER (A) 03/25/2016 1611   APPEARANCEUR CLEAR 03/25/2016 1611   LABSPEC 1.025 03/25/2016 1611   PHURINE 5.0 03/25/2016 1611   GLUCOSEU NEGATIVE 03/25/2016 1611   HGBUR NEGATIVE 03/25/2016 1611   BILIRUBINUR SMALL (A) 03/25/2016 1611   KETONESUR 15 (A) 03/25/2016 1611   PROTEINUR NEGATIVE 03/25/2016 1611   UROBILINOGEN 0.2 06/23/2012 1823   NITRITE NEGATIVE 03/25/2016 1611   LEUKOCYTESUR NEGATIVE 03/25/2016 1611   Sepsis Labs: @LABRCNTIP (procalcitonin:4,lacticidven:4)  )No results found for this or any previous visit (from the past 240 hour(s)).   Invalid input(s): PROCALCITONIN, LACTICACIDVEN   Radiology Studies: US Scrotum  Result Date: 03/25/2016 CLINICAL DATA:  Recurrent left scrotal pain and swelling. EXAM: SCROTAL ULTRASOUND DOPPLER ULTRASOUND OF THE TESTICLES TECHNIQUE: Complete ultrasound examination of the testicles, epididymis, and other  scrotal structures was performed. Color and spectral Doppler ultrasound were also utilized to evaluate blood flow to the testicles. COMPARISON:  CT scan, same date. FINDINGS: Right testicle Measurements: 3.1 x 1.5 x 2.5 cm. Symmetric and homogeneous echotexture without focal lesion. Patent arterial and venous blood flow. Left testicle Measurements: 4.4 x 2.6 x 3.1 cm. Symmetric and homogeneous echotexture without focal lesion. Patent arterial and venous blood flow. Right epididymis:  Normal in size and appearance. Left epididymis:  Normal in size and appearance. Hydrocele:  Moderate-sized complex hydrocele containing some debris. Varicocele:  Left-sided varicocele. Pulsed Doppler interrogation of both testes demonstrates normal low resistance arterial and venous waveforms bilaterally. IMPRESSION: 1. Normal sonographic appearance of both testicles. 2. Moderate left-sided complex hydrocele. 3. Left-sided varicocele. Electronically Signed   By: Marijo Sanes M.D.   On: 03/25/2016 19:08   Ct Abdomen Pelvis W Contrast  Result Date: 03/25/2016 CLINICAL DATA:  Metastatic rectal cancer. Abdominal pain, nausea and vomiting. Recurrent  scrotal swelling status post drainage of hydrocele. EXAM: CT ABDOMEN AND PELVIS WITH CONTRAST TECHNIQUE: Multidetector CT imaging of the abdomen and pelvis was performed using the standard protocol following bolus administration of intravenous contrast. CONTRAST:  <MEASUREMENBryn GuLake Surgery And Endoscopy 4CBaylor Scott & White Medical Center Te64mpleMarland KitHarbin Clinic LLCchZonIvery Quale KJoni ReTX7115 Airport LJohnell31 Grand View SurgeryGilGrMargaPhiEvaGeOMadigan MargarMeche24Citigrou Bryn GuSpringfield Regional Medi28cBoynton Beach Asc38 LLCMarland KitProwers Medical CenterchZonIvery Quale KJoni ReTX79391 Campfire AJohnell58 [facility] Char51329GilGrMargaPhGeOWest Coast JoiMargarMeche17Citigrou Bryn GuSelect Specialty Hospital Sout62hPecos Valley Eye Surgery Center31 LLCMarland KitSsm Health St. Anthony Hospital-Oklahoma CitychZonIvery Quale KJoni ReTX11 South [place] StGilGrMarMargarMeche24Citigrou Bryn GuPremier Physicians C25eStonegate Surgery Cente78r LPMarland KitJohn J. Pershing Va Medical CenterchZonIvery Quale KJoni ReTX2175 Santa Clara AveJohnell66 BluGilGrMargaPhiSouth HiGeOSt. Luke'S RegiMargarMeche11Citigrou Bryn GuProvidence St. John'S Hea54lDallas Endoscopy Center50 LtdMarland KitWalker Surgical Center LLCchZonIvery Quale KJoni ReTX75 Hilltop AJohnell38 West Tennessee HealthcaGilGrMargaPhiWoodcliGeOHutchinson Clinic Pa Inc Dba Hutchinson ClinMargarMeche1Citigrou Bryn GuSutter Coas5tTri City Regional Surgery Center69 LLCMarland KitGarrett County Memorial HospitalchZonIvery Quale KJoni ReTX4288 Elmwood Johnell78 Putnam CommuniGilGrMargaPhiOGeOCloud CMargarMeche3Citigrou Bryn GuStonecreek Surg9eLafayette Regional Rehabilitation Hosp73italMarland KitSt Joseph'S Hospital NorthchZonIvery Quale KJoni ReTX68432 ChestGilGrMargaPhiLGeOFry EyeMargarMeche42Citigrou Bryn GuCentra Specialt77yThe Renfrew Center Of Flo43ridaMarland KitKhs Ambulatory Surgical CenterchZonIvery Quale KJoni ReTX405 Sheffield DrJohnell74 HeriGilGrMargaPGeOGulfport BehavMargarMeche101Citigrou Bryn GuUnited Medical Rehabilitatio76nHospital Of Fox Chase Cancer Ce32nterMarland KitMedstar Washington Hospital CenterchZonIvery Quale KJoni ReTX2294 Atlantic StrJohnell58 Vision One Laser And SurgerGilGrMargaPhiMarGeOHu-Hu-Kam MemorialMargarMeche18Citigrou Bryn GuChi St Lukes Health Memor33iBartow Regional Medical Ce3nterMarland KitEmpire Eye Physicians P SchZonIvery Quale KJoni ReTX49232 Arlington Johnell19 StGilGrMargaPhiSt. GeOKingman Regional Medical Center-HualaMargarMeche21Citigrou Bryn GuFisher-Titu62sSojourn At Se38necaMarland KitSurgery Center Of Lancaster LPchZonIvery Quale KJoni ReTX36 West Plumb Branch RJohnell56 Wisconsin SurgeGilGrMargaPhGeOHalifax RegiMargarMeche36Citigrou Bryn GuMiami Va Medi76cSouthern Coos Hospital & Health Ce59nterMarland KitVibra Of Southeastern MichiganchZonIvery Quale KJoni ReTX28166 Plymouth StrJohnell20 Fairfax BehavioraGilGrMargaPhiElizabGeOLake CityMargarMeche38Citigrou Bryn GuNorthside Hospi25tWashington Orthopaedic Center In74c PsMarland KitNorthern Colorado Rehabilitation HospitalchZonIvery Quale KJoni ReTX5288 Clark RJohnell90 Sparrow SGilGrMargaPhiGeOEndoscMargarMeche66Citigrou Bryn GuAdvocate Trinit65yKindred Hospital St Louis S15outhMarland KitDreyer Medical Ambulatory Surgery CenterchZonIvery Quale KJoni ReTX59236 Bow Ridge Johnell47 SainGilGrMargaPhiNew GeOWishekMargarMeche28Citigrou Bryn GuDiscover Vision Surgery And Laser 75CLaredo Medical Ce60nterMarland KitNew England Baptist HospitalchZonIvery Quale KJoni ReTX2715 Myrtle LJohnell10 Providence HospitChar22629GilGrMargaPhiAgourGeORawlins CMargarMeche88Citigrou Bryn GuLong Island Ambulatory Surgery 71CPiedmont Athens Regional Med Ce89nterMarland KitMesquite Specialty HospitalchZonIvery Quale KJoni ReTX2986 Glen Eagles AJohnell62 Garland Surgicare Partners LtdGilGrMargaPhGeOMitchell County HospMargarMeche61Citigrou Bryn GuTimonium Surgery 74CHoward County General Hosp66italMarland KitVibra Hospital Of RichardsonchZonIvery Quale KJoni ReTX7702 Honey Creek LJohnell28 Aspirus LanglChar209295TJEGilGrMargaPhiCrandoGeOKaiser Fnd MargarMeche12Citigrou Bryn GuAlgonquin Road Surgery 9CAnna Jaques Hosp70italMarland KitClaremore HospitalchZonIvery Quale KJoni ReTX5292 Iroquois Johnell2GilGrMargaPhiSomerGMargarMeche23Citigrou Bryn GuSt. Joseph'S Behavioral Hea61lEncino Outpatient Surgery Center39 LLCMarland KitCoffeyville Regional Medical CenterchZonIvery Quale KJoni ReTX29391 Campfire AJohnell10 Baylor St Lukes MediGilGrMargaPhiGeODMargarMeche36Citigrou Bryn GuSaint Thomas Dekal53bNorth Shore Endoscopy Center20 LtdMarland KitEl Campo Memorial HospitalchZonIvery Quale KJoni ReTX98458 Gregory DrJGilGrMargaPhiSantaGeORaleigh EnMargarMeche2Citigrou Bryn GuRutherford Hosp51iShands Hosp36italMarland KitHackensack Meridian Health CarrierchZonIvery Quale KJoni ReTX1704 W. Myrtle Johnell48 Hebrew Rehabilitation CentChaGilGrMargaPhiFrGeOUvMargarMeche58Citigrou Bryn GuIu Health Saxon74yFranciscan St Anthony Health - [place] 84CityMarland KitGila Regional Medical CenterchZonIvery Quale KJoni ReTX7685 South Bank Johnell45 Cataract And LaseGilGrMargaPhiGeOOrthocareMargarMeche42Citigrou Bryn GuAdvanced Outpatient Surgery Of Ok78lNortheastern Vermont Regional Hosp16italMarland KitNazareth HospitalchZonIvery Quale KJoni ReTX51 Fairway StrJohnell5 Cleveland Eye AndGilGrMargaPhiPeGeOGreat Plains RegiMargarMeche77Citigrou Bryn GuChatuge Regiona75lMinimally Invasive Surgery Center Of New Eng49landMarland KitThayer County Health ServiceschZonIvery Quale KJoni ReTX568 Highland Johnell60 Good Samaritan HospitalChar930295TJLarkiGilGrMargaPhiMountGeOOhioheaMargarMeche21Citigrou Bryn GuMemorial Hermann The Woodland81sDameron Hosp45italMarland KitCharlton Memorial HospitalchZonIvery Quale KJoni ReTX39299 Pin Oak LJohnell75 Select Specialty HospitaGilGrMargaPhGeOSelect Specialty HMargarMeche36CitGSakakawea MMargarMech Bryn GuPinnaclehealth Commun67iProvidence Hood River Memorial Hosp22italMarland KitPrivate Diagnostic Clinic PLLCchZonIvery Quale KJoni ReTX118 Cedar RJohnell19 GilGrMargaPhiOaGeOSan GorgoniMargarMeche52Citigrou Bryn GuSan Jorge Children68sBethesda Rehabilitation Hosp58italMarland KitAustin Lakes HospitalchZonIvery Quale KJoni ReTX5764 Front Johnell39 Edward Hines Jr. VeteGilGrMargaPhiJGeOGolden PlainsMargarMeche60Citigrou Bryn GuBluefield Regional Medi18cRothman Specialty Hosp21italMarland KitAmg Specialty Hospital-WichitachZonIvery Quale KJoni ReTX466 Pumpkin Hill RJohnell73 Mcalester RegionaGilGrMargaPhiMiGeOColumbus OrthopaediMargarMeche56Citigrou Bryn GuSage Rehabilitation62 Genesis Medical Center-De71wittMarland KitCataract And Laser Center IncchZonIvery Quale KJoni ReTX19166 Sycamore Johnell19 Bgc [phone] City IlliGilGrMargaPhiTGeOSouthern California Hospital MargarMeche82Citigrou Bryn GuSolar Surgical 59CGuilord Endoscopy Ce4nterMarland KitKindred Hospital North HoustonchZonIvery Quale KJoni ReTX89 Newbridge StrJohnell22 St Davids [facility] Area Asc, LLC Dba St Davids AustiGilGrMargaPhiArGeOEmoMargarMeche5Citigrou<MEASUREMENTUp Health System -11 Greater G[facility] Endoscopy Center51 William Bee Ririe HospitalIvery Qu95 Lincoln JohnGiOSoutMargarMecheCitigrou Bryn GuSt. Elizabet52hFolsom Sierra Endoscopy Ce48nterMarland KitArkansas Heart HospitalchZonIvery Quale KJoni ReTX39751 Marsh Johnell43 Cha CambriChaGilGrMargaPhiGayGeOMerMargarMeche14Citigrou Bryn G24uSumma Health Systems Akron Hosp68italMarland KitPoplar Bluff Regional Medical Center - WestwoodchZonIvery Quale KJoni ReTX82 John Johnell69 Eye Surgery CGilGrMargaPhGeOTMargarMeche39Citigrou Bryn GuExodus Re61cGolden Ridge Surgery Ce21nterMarland KitForbes HospitalchZonIvery Quale KJoni ReTX994 AspenGilGrMargaPhGeODelta CountMargarMeche103Citigrou Bryn GuNortheast Regional Medi33cSelect Speciality Hospital Grosse P50ointMarland KitAscension Providence Health CenterchZonIvery Quale KJoni ReTX49650 SE. [place] Johnell13 BoGilGrMargaPhiLake GeOLoMargarMeche54Citigrou Bryn GuCtgi Endoscopy 39CBerwick Hospital Ce42nterMarland KitSkiff Medical CenterchZonIvery Quale KJoni ReTX2883 [address]. [address] AJohnell10 Ut Health East TexGilGrMargaPhiClarkGeOUchealth HighlMargarMeche81Citigrou Bryn GuCarle S37uChi Lisbon He15althMarland KitJackson SouthchZonIvery Quale KJoni ReTX35 Alderwood Johnell47 GraGilGrMargaPhiSpriGeOUniversity HMargarMeche59Citigrou Bryn GuCornerstone Specialty Hospit18aPlaza Surgery Ce98Northwest Eye SpecialistsLLCntZonIvery Quale7617 West Laurel AJohnel31lArizona State ForenCGPOFerrell Hospital CoMargarMeche41Citigrou Bryn GuBay Pines Va Medi13cFilutowski Cataract And Lasik Institut9e PaMarland KitPhysicians Regional - Collier BoulevardchZonIvery Quale KJoni ReTX23 North Pierce AvGilGrMargaPhiHaGeOFrankliMargarMeche76Citigrou Bryn GuValley Medical Plaza Ambu33lLittle River Healthcare - Cameron Hosp35italMarland KitChattanooga Surgery Center Dba Center For Sports Medicine Orthopaedic SurgerychZonIvery Quale KJoni ReTX869 Kirkland Johnell75 Trousdale MeCGilGrMargaPhiMirGeOCentral TMargarMeche83Citigrou Bryn GuOceans Behavioral Hospital O2fWops38 IncMarland KitLehigh Valley Hospital SchuylkillchZonIvery Quale KJoni ReTX31 Pumpkin [place] Johnell30 Mease CoGilGrMargaPhiThGeONorth BayMargarMeche68Citigrou Bryn GuInterstate Ambulatory Surg28eAssurance Health Cincinnati74 LLCMarland KitNebraska Medical CenterchZonIvery Quale KJoni ReTX59870 Evergreen AveJohnell30 PinGilGrMargaPhiDiGeOAscension SMargarMeche23Citigrou Bryn GuGarden Grove Hospital And Medi14cRogers Mem H73sptlMarland KitPrinceton Community HospitalchZonIvery Quale KJoni ReTX6275 N. [address] Louis Johnell2 CentraGilGrMargaPhGeOGreenbriar RehaMargarMeche38Citigrou Bryn GuSt Catherine Ho53sReston Hospital Ce77nterMarland KitFrances Mahon Deaconess HospitalchZonIvery Quale KJoni ReTX23 North Cemetery Johnell89 Community MenGilGrMargaPhGeOSurgical CenMargarMeche45Citigrou Bryn GuUt Health East Te35xChildren'S Rehabilitation Ce58nterMarland KitBlue Bonnet Surgery PavilionchZonIvery Quale KJoni ReTX59428 East Galvin DrJohnell9 Encompass Health RehabilitatiGilGrMargaPhiTaGeOTotal Joint CentMargarMeche25Citigrou Bryn GuMarshall Brownin79gBozeman Deaconess Hosp34italMarland KitPinckneyville Community HospitalchZonIvery Quale KJoni ReTX7662 Madison CoJohnell5GilGrMargaPhiMechanGeOSaratoga MargarMeche60Citigrou Bryn GuEvangelical Communit55yJohnson County Surgery Cente48r LPMarland KitWest Norman Endoscopy Center LLCchZonIvery Quale KJoni ReTX778 Pennington Johnell72 California Pacific Med Ctr-CalChar(54129GilGrMargaPhiSGMargarMeche36Citigrou Bryn GuNorth Valle58yGold Coast Surgice48nterMarland KitLake Travis Er LLCchZonIvery Quale KJoni ReTX57181 Euclid AJohnelGilGrMargaPhGeOPam Speciality HospitMargarMeche57Citigrou Bryn GuBone And Joint Institute Of Tennessee Surgery 9CSoutheast Louisiana Veterans Health Care Sy41stemMarland KitMid Atlantic Endoscopy Center LLCchZonIvery Quale KJoni ReTX316 Arcadia Johnell37 Oak Point SurgiGilGrMargaPhiWest KGeOMountain VieMargarMeche35Citigrou Bryn GuSt Margaret8sColumbus Community Hosp17italMarland KitRockford Gastroenterology Associates LtdchZonIvery Quale KJoni ReTX81 Plumb Branch Johnell46 Hima SanChar323295TJVaugGilGrMargaPhiPraGeOSalt LakMargarMeche59Citigrou Bryn GuStar Valley Medi29cBaptist Hospitals Of Southeast T38exasMarland KitMedical Center EnterprisechZonIvery Quale KJoni ReTX5602 Wood Johnell49 Southeast Louisiana VeterGilGrMargaPhiPoppGeOCharlie NorwooMargarMeche62Citigrou Bryn GuMedical Center Of Trinity West24 Johnson City Specialty Hosp14italMarland KitMercy Medical Center-North IowachZonIvery Quale KJoni ReTX5463 Oak Meadow AJohnell43 Eye Care SpChar9295TJNmc Surgery CenGilGrMargaPhiOweGeODuMargarMeche15Citigrouple ClaudesHospitalery Center Of NacogdochesoKentuckyhLowOte(9ArlyssJaNew290 North Br55 pelvis. FINDINGS: Lower chest: Numerous (greater than 20) irregular pulmonary nodules are seen throughout both lung bases, several of which are cavitary, which are significantly increased in size and number since 10/19/2013. Cavitary left lower lobe 2.4 cm nodule (series 3/image 8) is new. Irregular 2.7 cm right middle lobe nodule (series 3/ image 3) is increased from 1.2 cm. Hepatobiliary: Numerous hypodense liver masses (greater  than 20) scattered throughout the liver are increased in size and number since 10/19/2013. For example a 5.1 cm far lateral segment left liver lobe mass (series 2/ image 14) is increased from 3.2 cm. Hypodense 1.9 cm posterior right liver dome (series 2/image 12) mass appears new. Cholecystectomy. Bile ducts are within expected post cholecystectomy limits and appear stable. Pancreas: Normal, with no mass or duct dilation. Spleen: Normal size. No mass. Adrenals/Urinary Tract: No discrete right adrenal nodule. New hypodense 1.2 cm left adrenal nodule (series 2/ image 32). No hydronephrosis. Hypodense 0.3 cm renal cortical lesion in the posterior interpolar left kidney is too small to characterize and not definitely changed, suggesting a benign renal cyst. No new renal mass. Relatively collapsed bladder with diffuse bladder wall thickening, not appreciably changed. No bladder stones . Stomach/Bowel: Grossly normal stomach. Stable appearance of the enteroenterostomy in the distal small bowel. Normal caliber small bowel. No small bowel wall thickening. Normal appendix. No definite large bowel wall thickening accounting for the collapsed state. Haziness of the presacral space fat, unchanged. No presacral fluid collections. Vascular/Lymphatic: Atherosclerotic nonaneurysmal abdominal aorta. Patent portal, splenic, hepatic and renal veins. There is soft tissue encasing the IVC and abdominal aorta, measuring up to the 1.1 cm thickness, largely new. Otherwise no abdominopelvic adenopathy. Reproductive: Mildly enlarged prostate. Other: No pneumoperitoneum, ascites or focal fluid collection. Small fat containing right inguinal hernia. Musculoskeletal: There is a new fracture of the inferior L2 vertebral body with associated central patchy sclerosis and 6 mm retropulsion of the posterior fracture fragment. Mild thoracolumbar spondylosis. No additional focal osseous lesions. Right posterior spinal stimulator traverses the right S3  neural foramen. IMPRESSION: 1. Bibasilar cavitary pulmonary metastases are significantly increased in size and number since the most recent comparison study of 10/19/2013. 2. Numerous liver metastases are significantly increased in size and number since 10/19/2013. 3. New infiltrative soft tissue encasing the IVC and abdominal aorta, most consistent with infiltrative confluent metastatic adenopathy. 4. New inferior L2 vertebral body fracture, which appears acute/subacute, with 6 mm retropulsion of a posterior fracture fragment into the anterior lumbar spinal canal. This may represent a pathologic fracture given patchy sclerotic change in the L2 vertebral body. 5. 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 6. No evidence of bowel obstruction or acute bowel inflammation. Normal appendix . 7. Stable diffuse bladder wall thickening, which may represent chronic post treatment change. Recommend correlation with urinalysis to exclude acute cystitis . No hydronephrosis. 8. Additional findings include aortic atherosclerosis, small fat containing right inguinal hernia and mild prostatomegaly. Electronically Signed   By: Jason A Poff M.D.   On: 03/25/2016 16:50   Us Art/ven Flow Abd Pelv Doppler  Result Date: 03/25/2016 CLINICAL DATA:  Recurrent left scrotal pain and swelling. EXAM: SCROTAL ULTRASOUND DOPPLER ULTRASOUND OF THE TESTICLES TECHNIQUE: Complete  ultrasound examination of the testicles, epididymis, and other scrotal structures was performed. Color and spectral Doppler ultrasound were also utilized to evaluate blood flow to the testicles. COMPARISON:  CT scan, same date. FINDINGS: Right testicle Measurements: 3.1 x 1.5 x 2.5 cm. Symmetric and homogeneous echotexture without focal lesion. Patent arterial and venous blood flow. Left testicle Measurements: 4.4 x 2.6 x 3.1 cm. Symmetric and homogeneous echotexture without focal lesion. Patent arterial and venous blood flow. Right epididymis:  Normal  in size and appearance. Left epididymis:  Normal in size and appearance. Hydrocele:  Moderate-sized complex hydrocele containing some debris. Varicocele:  Left-sided varicocele. Pulsed Doppler interrogation of both testes demonstrates normal low resistance arterial and venous waveforms bilaterally. IMPRESSION: 1. Normal sonographic appearance of both testicles. 2. Moderate left-sided complex hydrocele. 3. Left-sided varicocele. Electronically Signed   By: Marijo Sanes M.D.   On: 03/25/2016 19:08        Scheduled Meds: . heparin  5,000 Units Subcutaneous Q8H  . sodium chloride flush  10-40 mL Intracatheter Q12H   Continuous Infusions:    LOS: 1 day    Time spent: 35 minutes    Salaya Holtrop A, MD [facility] Pager [phone]  If 7PM-7AM, please contact night-coverage [email or web address] Password [email or web address] 03/26/2016, 12:16 PM

## 2016-03-26 NOTE — Progress Notes (Signed)
Stimulator is MRI safe but due to stimulator pt can only have a brain scan where we use a special head coil to do that, Rn to be notified and MD

## 2016-03-26 NOTE — Progress Notes (Signed)
Nutrition Brief Note  Patient identified on the Malnutrition Screening Tool (MST) Report  Wt Readings from Last 15 Encounters:  03/25/16 183 lb 3.2 oz (83.1 kg)  10/21/13 205 lb 4.8 oz (93.1 kg)  06/24/13 204 lb 9.6 oz (92.8 kg)  03/25/13 209 lb 8 oz (95 kg)  11/20/12 212 lb 14.4 oz (96.6 kg)  09/02/12 203 lb 14.4 oz (92.5 kg)  08/27/12 200 lb 9.6 oz (91 kg)  08/18/12 203 lb 6.4 oz (92.3 kg)  08/04/12 202 lb 9.6 oz (91.9 kg)  07/22/12 203 lb 4.8 oz (92.2 kg)  07/07/12 204 lb 9.6 oz (92.8 kg)  06/26/12 199 lb 9.6 oz (90.5 kg)  06/23/12 202 lb 9.6 oz (91.9 kg)  06/02/12 210 lb 14.4 oz (95.7 kg)  05/21/12 209 lb 11.2 oz (95.1 kg)    Body mass index is 25.55 kg/m. Patient meets criteria for Overweight based on current BMI. Pt reports losing 10-15 lbs in the past 3 months due to getting full fast. He states that he is probably eating the way he should have been years ago with smaller portions. He states that he is eating what he wants. He denies any nutrition needs at this time. He has family bringing him food from outside the hospital.   Current diet order is Heart Healthy, patient is consuming approximately 50-75% of meals at this time. Labs and medications reviewed.   No nutrition interventions warranted at this time. If nutrition issues arise, please consult RD.   Scarlette Ar RD, CSP, LDN Inpatient Clinical Dietitian Pager: [phone] After Hours Pager: [phone]

## 2016-03-26 NOTE — Progress Notes (Signed)
Patient c/o pain that is not control with dilaudid. MD paged regarding this. Awaiting for call back.   Ave Filter, RN

## 2016-03-27 DIAGNOSIS — C189 Malignant neoplasm of colon, unspecified: Secondary | ICD-10-CM

## 2016-03-27 DIAGNOSIS — S32029A Unspecified fracture of second lumbar vertebra, initial encounter for closed fracture: Secondary | ICD-10-CM

## 2016-03-27 DIAGNOSIS — N50812 Left testicular pain: Secondary | ICD-10-CM

## 2016-03-27 DIAGNOSIS — C787 Secondary malignant neoplasm of liver and intrahepatic bile duct: Secondary | ICD-10-CM

## 2016-03-27 MED ORDER — OXYCODONE HCL 20 MG PO TABS: 20.0000 mg | ORAL_TABLET | ORAL | 0 refills | Status: AC | PRN

## 2016-03-27 MED ORDER — OXYCODONE HCL ER 20 MG PO T12A: 20.0000 mg | EXTENDED_RELEASE_TABLET | Freq: Two times a day (BID) | ORAL | 0 refills | Status: AC

## 2016-03-27 MED ORDER — SENNA 8.6 MG PO TABS: 2.0000 | ORAL_TABLET | Freq: Every day | ORAL | 0 refills | Status: AC

## 2016-03-27 NOTE — Care Management Note (Signed)
Case Management Note  Patient Details  Name: [name] MRN: [id number] Date of Birth: Aug 24, 1965  Subjective/Objective:                    Action/Plan: Pt discharging home with self care. MD consulted CM to ensure patient had follow up within the next week with his PCP or Oncologist for pain control. CM met with the patient and his wife and asked about him being seen within the week at his PCP. [name] called the patients PCP office and they are not willing to see him for pain control. [name] called the oncologist office and they are going to fit him in on Monday. Dr Bonner Puna made aware.   Expected Discharge Date:                  Expected Discharge Plan:  Home/Self Care  In-House Referral:     Discharge planning Services     Post Acute Care Choice:    Choice offered to:     DME Arranged:    DME Agency:     HH Arranged:    Towns Agency:     Status of Service:  Completed, signed off  If discussed at [employer] of Stay Meetings, dates discussed:    Additional Comments:  Pollie Friar, RN 03/27/2016, 3:56 PM

## 2016-03-27 NOTE — Progress Notes (Signed)
Discharge instructions reviewed with patient and family. RXs given. All questions answered at this time. Transport home by family.   Ave Filter, RN

## 2016-03-27 NOTE — Discharge Summary (Signed)
Physician Discharge Summary  [name] [id number] DOB: Nov 23, 1965 DOA: 03/25/2016  PCP: Eric Shire, MD  Admit date: 03/25/2016 Discharge date: 03/27/2016  Admitted From: Home Disposition: Home  Recommendations for Outpatient Follow-up:  1. Follow up with palliative care for ongoing management of pain related to metastases and pathologic fracture of L2 vertebra. 2. Follow up with oncology and possibly radiation oncology (sees both at [facility]) for pathologic L2 fracture with retropulsion into spinal canal. Pt had no neurological deficits or back pain. 3. Patient to follow up as previously scheduled with [facility] 9/21, [name].   Home Health: None Equipment/Devices: None  Discharge Condition: Stable CODE STATUS: Full Diet recommendation: Regular  Brief/Interim Summary: [name] a 50 y.o.malewith medical history significant of metastatic colon cancer. He usually follows with [facility] for oncology, but he developed severe uncontrolled pain 9/19 and presented to [facility] ED with uncontrolled left abd pain and testicle pain. Patient has a hydrocele and had some fluid drained off a couple of days ago at [facility] to help with symptoms "just to prove to him that draining the hydrocele wouldn't really help". It provided slight relief for a couple of days before recurring with severe pain. At home he was taking oxy IR 10mg  prn, though this was essentially taken q4h without relief.   Initial work up included CT abdomen and pelvis which showed a new inferior L2 vertebral body fracture with 78mm retropulsion of posterior fracture into anterior lumbar spinal canal. Interval increase in size and number of pulmonary and hepatic metastases, and new infiltrative soft tissue encasing IVC and abdominal aorta were noted. MRI was not possible due to spinal stimulator.   He was admitted for pain control. [name], his oncologist at [facility], was consulted and recommended oxycontin 20mg  BID  and oxycodone 20mg  prn breakthrough pain. IV dilaudid was also used initially, though his requirement for this diminished the day following admission. His pain is controlled on this oral regimen without evidence of respiratory compromise. He and his wife were strongly cautioned against coadministration of these high doses and benzodiazepines, which he has taken for anxiety. Prescriptions for pain medications were provided for 7 days with plan to follow up within that time for ongoing chronic pain management.  Discharge Diagnoses:  Principal Problem:   Pathologic fracture of lumbar vertebra Active Problems:   Metastatic colon cancer to liver (HCC)   Pain in left testicle  Pathologic L2 fracture -This is likely secondary to bone metastases, has another sclerotic lesion. -MRI couldn't be done because of spinal stimulator. -Initial recommendation to get radiation oncology to follow him up, but patient reported no significant back pain.  Discharge Instructions Discharge Instructions    Discharge instructions    Complete by:  As directed    You were admitted for uncontrolled pain and an acceptable regimen of oral pain medications has been selected. This is:  - Oxycontin 20mg  twice per day - Oxycodone 20mg  by mouth every 4 hours as needed for breakthrough pain - The goal is manageable levels of pain, not being completely pain-free.  - Doses this high of narcotics put you at risk of respiratory problems, especially when also taken with benzodiazepines (e.g. xanax, ativan) so try to minimize these medications as much as possible. Please stop taking these medications and seek urgent medical care if any breathing problems occur.  It is very important that you follow up with your oncologist. We are working to create a referral to [place] near your home  for ongoing management of pain.       Medication List    STOP taking these medications   Oxycodone HCl 10 MG Tabs Replaced by:  oxyCODONE  20 mg 12 hr tablet You also have another medication with the same name that you need to continue taking as instructed.     TAKE these medications   COTELLIC 20 MG tablet Generic drug:  cobimetinib fumarate   diphenhydrAMINE 25 mg capsule Commonly known as:  BENADRYL Take 25 mg by mouth every 6 (six) hours as needed for itching.   LORazepam 1 MG tablet Commonly known as:  ATIVAN Take 1 mg by mouth every 4 (four) hours as needed. For sleep or anxiety   ondansetron 8 MG tablet Commonly known as:  ZOFRAN Take 8 mg by mouth See admin instructions. Take 8 mg by mouth twice a day  X 3  days after chemo and as needed for nausea   Oxycodone HCl 20 MG Tabs Take 1 tablet (20 mg total) by mouth every 4 (four) hours as needed for moderate pain. What changed:  medication strength  how much to take  reasons to take this  additional instructions  Another medication with the same name was removed. Continue taking this medication, and follow the directions you see here.   oxyCODONE 20 mg 12 hr tablet Commonly known as:  OXYCONTIN Take 1 tablet (20 mg total) by mouth every 12 (twelve) hours. What changed:  You were already taking a medication with the same name, and this prescription was added. Make sure you understand how and when to take each. Replaces:  Oxycodone HCl 10 MG Tabs   prochlorperazine 10 MG tablet Commonly known as:  COMPAZINE Take 10 mg by mouth every 6 (six) hours as needed. Nausea   senna 8.6 MG Tabs tablet Commonly known as:  SENOKOT Take 2 tablets (17.2 mg total) by mouth daily. Start taking on:  03/28/2016      Follow-up Information    Schedule an appointment as soon as possible for a visit  with Eric Shire, MD.   Specialty:  Family Medicine Why:  for re-evaluation Contact information: [address] [place] [address] [phone]          Allergies  Allergen Reactions  . Oxaliplatin Other (See Comments)    Flushing, throat  tightness  . Penicillins Itching    Intermittent reaction per pt Has patient had a PCN reaction causing immediate rash, facial/tongue/throat swelling, SOB or lightheadedness with hypotension: Yes Has patient had a PCN reaction causing severe rash involving mucus membranes or skin necrosis: No Has patient had a PCN reaction that required hospitalization No Has patient had a PCN reaction occurring within the last 10 years: No If all of the above answers are "NO", then may proceed with Cephalosporin use.   Marland Kitchen Zolpidem Other (See Comments)    Mental status change and nightmares  . Tape Rash    Consultations:  None  Procedures/Studies: US Scrotum  Result Date: 03/25/2016 CLINICAL DATA:  Recurrent left scrotal pain and swelling. EXAM: SCROTAL ULTRASOUND DOPPLER ULTRASOUND OF THE TESTICLES TECHNIQUE: Complete ultrasound examination of the testicles, epididymis, and other scrotal structures was performed. Color and spectral Doppler ultrasound were also utilized to evaluate blood flow to the testicles. COMPARISON:  CT scan, same date. FINDINGS: Right testicle Measurements: 3.1 x 1.5 x 2.5 cm. Symmetric and homogeneous echotexture without focal lesion. Patent arterial and venous blood flow. Left testicle Measurements: 4.4 x  2.6 x 3.1 cm. Symmetric and homogeneous echotexture without focal lesion. Patent arterial and venous blood flow. Right epididymis:  Normal in size and appearance. Left epididymis:  Normal in size and appearance. Hydrocele:  Moderate-sized complex hydrocele containing some debris. Varicocele:  Left-sided varicocele. Pulsed Doppler interrogation of both testes demonstrates normal low resistance arterial and venous waveforms bilaterally. IMPRESSION: 1. Normal sonographic appearance of both testicles. 2. Moderate left-sided complex hydrocele. 3. Left-sided varicocele. Electronically Signed   By: Marijo Sanes M.D.   On: 03/25/2016 19:08   Ct Abdomen Pelvis W Contrast  Result Date:  03/25/2016 CLINICAL DATA:  Metastatic rectal cancer. Abdominal pain, nausea and vomiting. Recurrent scrotal swelling status post drainage of hydrocele. EXAM: CT ABDOMEN AND PELVIS WITH CONTRAST TECHNIQUE: Multidetector CT imaging of the abdomen and pelvis was performed using the standard protocol following bolus administration of intravenous contrast. CONTRAST:  163mL ISOVUE-300 IOPAMIDOL (ISOVUE-300) INJECTION 61% COMPARISON:  10/19/2013 CT chest, abdomen and pelvis. FINDINGS: Lower chest: Numerous (greater than 20) irregular pulmonary nodules are seen throughout both lung bases, several of which are cavitary, which are significantly increased in size and number since 10/19/2013. Cavitary left lower lobe 2.4 cm nodule (series 3/image 8) is new. Irregular 2.7 cm right middle lobe nodule (series 3/ image 3) is increased from 1.2 cm. Hepatobiliary: Numerous hypodense liver masses (greater than 20) scattered throughout the liver are increased in size and number since 10/19/2013. For example a 5.1 cm far lateral segment left liver lobe mass (series 2/ image 14) is increased from 3.2 cm. Hypodense 1.9 cm posterior right liver dome (series 2/image 12) mass appears new. Cholecystectomy. Bile ducts are within expected post cholecystectomy limits and appear stable. Pancreas: Normal, with no mass or duct dilation. Spleen: Normal size. No mass. Adrenals/Urinary Tract: No discrete right adrenal nodule. New hypodense 1.2 cm left adrenal nodule (series 2/ image 32). No hydronephrosis. Hypodense 0.3 cm renal cortical lesion in the posterior interpolar left kidney is too small to characterize and not definitely changed, suggesting a benign renal cyst. No new renal mass. Relatively collapsed bladder with diffuse bladder wall thickening, not appreciably changed. No bladder stones . Stomach/Bowel: Grossly normal stomach. Stable appearance of the enteroenterostomy in the distal small bowel. Normal caliber small bowel. No small bowel  wall thickening. Normal appendix. No definite large bowel wall thickening accounting for the collapsed state. Haziness of the presacral space fat, unchanged. No presacral fluid collections. Vascular/Lymphatic: Atherosclerotic nonaneurysmal abdominal aorta. Patent portal, splenic, hepatic and renal veins. There is soft tissue encasing the IVC and abdominal aorta, measuring up to the 1.1 cm thickness, largely new. Otherwise no abdominopelvic adenopathy. Reproductive: Mildly enlarged prostate. Other: No pneumoperitoneum, ascites or focal fluid collection. Small fat containing right inguinal hernia. Musculoskeletal: There is a new fracture of the inferior L2 vertebral body with associated central patchy sclerosis and 6 mm retropulsion of the posterior fracture fragment. Mild thoracolumbar spondylosis. No additional focal osseous lesions. Right posterior spinal stimulator traverses the right S3 neural foramen. IMPRESSION: 1. Bibasilar cavitary pulmonary metastases are significantly increased in size and number since the most recent comparison study of 10/19/2013. 2. Numerous liver metastases are significantly increased in size and number since 10/19/2013. 3. New infiltrative soft tissue encasing the IVC and abdominal aorta, most consistent with infiltrative confluent metastatic adenopathy. 4. New inferior L2 vertebral body fracture, which appears acute/subacute, with 6 mm retropulsion of a posterior fracture fragment into the anterior lumbar spinal canal. This may represent a pathologic fracture given patchy sclerotic change  in the L2 vertebral body. 5. 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 6. No evidence of bowel obstruction or acute bowel inflammation. Normal appendix . 7. Stable diffuse bladder wall thickening, which may represent chronic post treatment change. Recommend correlation with urinalysis to exclude acute cystitis . No hydronephrosis. 8. Additional findings include aortic  atherosclerosis, small fat containing right inguinal hernia and mild prostatomegaly. Electronically Signed   By: Ilona Sorrel M.D.   On: 03/25/2016 16:50   [place] Art/ven Flow Abd Pelv Doppler  Result Date: 03/25/2016 CLINICAL DATA:  Recurrent left scrotal pain and swelling. EXAM: SCROTAL ULTRASOUND DOPPLER ULTRASOUND OF THE TESTICLES TECHNIQUE: Complete ultrasound examination of the testicles, epididymis, and other scrotal structures was performed. Color and spectral Doppler ultrasound were also utilized to evaluate blood flow to the testicles. COMPARISON:  CT scan, same date. FINDINGS: Right testicle Measurements: 3.1 x 1.5 x 2.5 cm. Symmetric and homogeneous echotexture without focal lesion. Patent arterial and venous blood flow. Left testicle Measurements: 4.4 x 2.6 x 3.1 cm. Symmetric and homogeneous echotexture without focal lesion. Patent arterial and venous blood flow. Right epididymis:  Normal in size and appearance. Left epididymis:  Normal in size and appearance. Hydrocele:  Moderate-sized complex hydrocele containing some debris. Varicocele:  Left-sided varicocele. Pulsed Doppler interrogation of both testes demonstrates normal low resistance arterial and venous waveforms bilaterally. IMPRESSION: 1. Normal sonographic appearance of both testicles. 2. Moderate left-sided complex hydrocele. 3. Left-sided varicocele. Electronically Signed   By: Marijo Sanes M.D.   On: 03/25/2016 19:08   Subjective: Patient reports controlled pain on oral regimen. No trouble breathing.  Discharge Exam: Vitals:   03/27/16 0926 03/27/16 1400  BP: 119/81 113/79  Pulse: 84 67  Resp: 18 18  Temp: 98.6 F (37 C) 97.7 F (36.5 C)   Vitals:   03/27/16 0039 03/27/16 0515 03/27/16 0926 03/27/16 1400  BP: 134/79 120/67 119/81 113/79  Pulse: 79 74 84 67  Resp: 20 20 18 18   Temp: 98 F (36.7 C) 98.3 F (36.8 C) 98.6 F (37 C) 97.7 F (36.5 C)  TempSrc: Oral Oral Oral Oral  SpO2: 99% 98%    Weight:       Height:       General: Pt is drowsy, awake, not in acute distress Cardiovascular: RRR, S1/S2 +, no rubs, no gallops Respiratory: CTA bilaterally, no wheezing, no rhonchi Abdominal: Soft, NT, ND, bowel sounds + Extremities: no edema, no cyanosis Back: No tenderness in midline or paraspinal, no spasm or step offs. Neuro: No incontinence, no deficits in strength or sensation in extremities.  The results of significant diagnostics from this hospitalization (including imaging, microbiology, ancillary and laboratory) are listed below for reference.    Microbiology: None  Labs: Basic Metabolic Panel:  Recent Labs Lab 03/25/16 1421  NA 141  K 3.9  CL 106  CO2 25  GLUCOSE 101*  BUN 13  CREATININE 0.97  CALCIUM 10.2   Liver Function Tests:  Recent Labs Lab 03/25/16 1421  AST 35  ALT 27  ALKPHOS 108  BILITOT 1.3*  PROT 7.9  ALBUMIN 4.4    Recent Labs Lab 03/25/16 1421  LIPASE 48   No results for input(s): AMMONIA in the last 168 hours. CBC:  Recent Labs Lab 03/25/16 1421  WBC 10.9*  HGB 16.6  HCT 48.6  MCV 82.8  PLT 219   Cardiac Enzymes: No results for input(s): CKTOTAL, CKMB, CKMBINDEX, TROPONINI in the last 168 hours. BNP: Invalid input(s): POCBNP  CBG: No results for input(s): GLUCAP in the last 168 hours. D-Dimer No results for input(s): DDIMER in the last 72 hours. Hgb A1c No results for input(s): HGBA1C in the last 72 hours. Lipid Profile No results for input(s): CHOL, HDL, LDLCALC, TRIG, CHOLHDL, LDLDIRECT in the last 72 hours. Thyroid function studies No results for input(s): TSH, T4TOTAL, T3FREE, THYROIDAB in the last 72 hours.  Invalid input(s): FREET3 Anemia work up No results for input(s): VITAMINB12, FOLATE, FERRITIN, TIBC, IRON, RETICCTPCT in the last 72 hours. Urinalysis    Component Value Date/Time   COLORURINE AMBER (A) 03/25/2016 1611   APPEARANCEUR CLEAR 03/25/2016 1611   LABSPEC 1.025 03/25/2016 1611   PHURINE 5.0  03/25/2016 1611   GLUCOSEU NEGATIVE 03/25/2016 1611   HGBUR NEGATIVE 03/25/2016 1611   BILIRUBINUR SMALL (A) 03/25/2016 1611   KETONESUR 15 (A) 03/25/2016 1611   PROTEINUR NEGATIVE 03/25/2016 1611   UROBILINOGEN 0.2 06/23/2012 1823   NITRITE NEGATIVE 03/25/2016 1611   LEUKOCYTESUR NEGATIVE 03/25/2016 1611   Sepsis Labs Invalid input(s): PROCALCITONIN,  WBC,  LACTICIDVEN Microbiology No results found for this or any previous visit (from the past 240 hour(s)).  Time coordinating discharge: Over 30 minutes  Vance Gather, MD  [facility] 03/27/2016, 2:59 PM Pager [phone]  If 7PM-7AM, please contact night-coverage [email or web address] Password [email or web address]

## 2016-03-27 NOTE — Discharge Instructions (Signed)
If you develop deficits in sensation or motor function of your legs, trouble holding your urine, or anesthesia/trouble with sensation in the "saddle area," including groin and bottom, you must be evaluated urgently as this may indicate worsening of the fracture in your back with compression of the spinal cord. This will require follow up as an outpatient.

## 2016-07-02 ENCOUNTER — Other Ambulatory Visit: Payer: Self-pay | Admitting: Nurse Practitioner

## 2016-07-08 DEATH — deceased
